# Patient Record
Sex: Female | Born: 1945 | Race: White | Hispanic: No | Marital: Married | Smoking: Never smoker
Health system: Southern US, Community
[De-identification: ages and names within clinical notes are randomized; demographics above are authoritative.]

## PROBLEM LIST (undated history)

## (undated) DIAGNOSIS — F32A Depression, unspecified: Secondary | ICD-10-CM

## (undated) DIAGNOSIS — T7840XA Allergy, unspecified, initial encounter: Secondary | ICD-10-CM

## (undated) DIAGNOSIS — Z8739 Personal history of other diseases of the musculoskeletal system and connective tissue: Secondary | ICD-10-CM

## (undated) DIAGNOSIS — F419 Anxiety disorder, unspecified: Secondary | ICD-10-CM

## (undated) DIAGNOSIS — H269 Unspecified cataract: Secondary | ICD-10-CM

## (undated) DIAGNOSIS — Z8719 Personal history of other diseases of the digestive system: Secondary | ICD-10-CM

## (undated) DIAGNOSIS — R195 Other fecal abnormalities: Secondary | ICD-10-CM

## (undated) DIAGNOSIS — F329 Major depressive disorder, single episode, unspecified: Secondary | ICD-10-CM

## (undated) HISTORY — DX: Other fecal abnormalities: R19.5

## (undated) HISTORY — DX: Unspecified cataract: H26.9

## (undated) HISTORY — PX: TUBAL LIGATION: SHX77

## (undated) HISTORY — PX: HAND SURGERY: SHX662

## (undated) HISTORY — PX: CHOLECYSTECTOMY: SHX55

## (undated) HISTORY — DX: Anxiety disorder, unspecified: F41.9

## (undated) HISTORY — PX: ABDOMINAL HYSTERECTOMY: SHX81

## (undated) HISTORY — DX: Depression, unspecified: F32.A

## (undated) HISTORY — DX: Major depressive disorder, single episode, unspecified: F32.9

## (undated) HISTORY — PX: COLONOSCOPY: SHX174

## (undated) HISTORY — PX: APPENDECTOMY: SHX54

## (undated) HISTORY — DX: Personal history of other diseases of the digestive system: Z87.19

## (undated) HISTORY — DX: Allergy, unspecified, initial encounter: T78.40XA

## (undated) HISTORY — DX: Personal history of other diseases of the musculoskeletal system and connective tissue: Z87.39

---

## 1998-09-20 ENCOUNTER — Other Ambulatory Visit: Admission: RE | Admit: 1998-09-20 | Discharge: 1998-09-20 | Payer: Self-pay | Admitting: Obstetrics and Gynecology

## 1999-10-14 HISTORY — PX: BREAST BIOPSY: SHX20

## 1999-12-30 ENCOUNTER — Other Ambulatory Visit: Admission: RE | Admit: 1999-12-30 | Discharge: 1999-12-30 | Payer: Self-pay | Admitting: Obstetrics and Gynecology

## 2000-02-20 ENCOUNTER — Encounter: Admission: RE | Admit: 2000-02-20 | Discharge: 2000-02-20 | Payer: Self-pay | Admitting: Obstetrics and Gynecology

## 2000-02-20 ENCOUNTER — Encounter: Payer: Self-pay | Admitting: Obstetrics and Gynecology

## 2000-02-26 ENCOUNTER — Encounter: Admission: RE | Admit: 2000-02-26 | Discharge: 2000-02-26 | Payer: Self-pay | Admitting: Obstetrics and Gynecology

## 2000-02-26 ENCOUNTER — Encounter: Payer: Self-pay | Admitting: Obstetrics and Gynecology

## 2000-03-02 ENCOUNTER — Encounter: Admission: RE | Admit: 2000-03-02 | Discharge: 2000-03-02 | Payer: Self-pay | Admitting: Obstetrics and Gynecology

## 2000-03-02 ENCOUNTER — Other Ambulatory Visit: Admission: RE | Admit: 2000-03-02 | Discharge: 2000-03-02 | Payer: Self-pay | Admitting: Obstetrics and Gynecology

## 2000-03-02 ENCOUNTER — Encounter: Payer: Self-pay | Admitting: Obstetrics and Gynecology

## 2000-04-09 ENCOUNTER — Encounter: Payer: Self-pay | Admitting: General Surgery

## 2000-04-09 ENCOUNTER — Observation Stay (HOSPITAL_COMMUNITY): Admission: RE | Admit: 2000-04-09 | Discharge: 2000-04-10 | Payer: Self-pay | Admitting: General Surgery

## 2000-04-09 ENCOUNTER — Encounter (INDEPENDENT_AMBULATORY_CARE_PROVIDER_SITE_OTHER): Payer: Self-pay | Admitting: Specialist

## 2000-06-23 ENCOUNTER — Encounter (INDEPENDENT_AMBULATORY_CARE_PROVIDER_SITE_OTHER): Payer: Self-pay

## 2000-06-23 ENCOUNTER — Inpatient Hospital Stay (HOSPITAL_COMMUNITY): Admission: RE | Admit: 2000-06-23 | Discharge: 2000-06-25 | Payer: Self-pay | Admitting: Urology

## 2002-08-11 ENCOUNTER — Encounter: Admission: RE | Admit: 2002-08-11 | Discharge: 2002-08-11 | Payer: Self-pay | Admitting: Obstetrics and Gynecology

## 2002-08-11 ENCOUNTER — Encounter: Payer: Self-pay | Admitting: Obstetrics and Gynecology

## 2003-09-14 ENCOUNTER — Encounter: Payer: Self-pay | Admitting: Internal Medicine

## 2003-09-21 ENCOUNTER — Encounter: Admission: RE | Admit: 2003-09-21 | Discharge: 2003-09-21 | Payer: Self-pay | Admitting: Obstetrics and Gynecology

## 2004-09-03 ENCOUNTER — Ambulatory Visit: Payer: Self-pay | Admitting: Internal Medicine

## 2004-09-17 ENCOUNTER — Ambulatory Visit: Payer: Self-pay | Admitting: Internal Medicine

## 2005-01-08 ENCOUNTER — Encounter: Admission: RE | Admit: 2005-01-08 | Discharge: 2005-01-08 | Payer: Self-pay | Admitting: Obstetrics and Gynecology

## 2005-06-30 ENCOUNTER — Emergency Department (HOSPITAL_COMMUNITY): Admission: EM | Admit: 2005-06-30 | Discharge: 2005-06-30 | Payer: Self-pay | Admitting: Emergency Medicine

## 2005-09-15 ENCOUNTER — Ambulatory Visit: Payer: Self-pay | Admitting: Internal Medicine

## 2005-10-07 ENCOUNTER — Ambulatory Visit: Payer: Self-pay | Admitting: Internal Medicine

## 2005-10-09 ENCOUNTER — Ambulatory Visit: Payer: Self-pay | Admitting: Internal Medicine

## 2005-10-09 ENCOUNTER — Encounter (INDEPENDENT_AMBULATORY_CARE_PROVIDER_SITE_OTHER): Payer: Self-pay | Admitting: Specialist

## 2005-11-14 ENCOUNTER — Ambulatory Visit: Payer: Self-pay | Admitting: Internal Medicine

## 2006-01-09 ENCOUNTER — Encounter: Admission: RE | Admit: 2006-01-09 | Discharge: 2006-01-09 | Payer: Self-pay | Admitting: Obstetrics and Gynecology

## 2007-02-08 ENCOUNTER — Encounter: Admission: RE | Admit: 2007-02-08 | Discharge: 2007-02-08 | Payer: Self-pay | Admitting: Family Medicine

## 2008-05-05 ENCOUNTER — Ambulatory Visit: Payer: Self-pay | Admitting: Internal Medicine

## 2008-05-05 DIAGNOSIS — F329 Major depressive disorder, single episode, unspecified: Secondary | ICD-10-CM

## 2008-05-05 DIAGNOSIS — F32A Depression, unspecified: Secondary | ICD-10-CM | POA: Insufficient documentation

## 2008-05-05 DIAGNOSIS — E785 Hyperlipidemia, unspecified: Secondary | ICD-10-CM | POA: Insufficient documentation

## 2008-05-05 DIAGNOSIS — K219 Gastro-esophageal reflux disease without esophagitis: Secondary | ICD-10-CM | POA: Insufficient documentation

## 2008-05-05 DIAGNOSIS — E7800 Pure hypercholesterolemia, unspecified: Secondary | ICD-10-CM | POA: Insufficient documentation

## 2008-05-05 LAB — CONVERTED CEMR LAB
Cholesterol, target level: 200 mg/dL
HDL goal, serum: 50 mg/dL
LDL Goal: 80 mg/dL

## 2008-05-11 ENCOUNTER — Ambulatory Visit: Payer: Self-pay | Admitting: Internal Medicine

## 2008-05-15 ENCOUNTER — Encounter (INDEPENDENT_AMBULATORY_CARE_PROVIDER_SITE_OTHER): Payer: Self-pay | Admitting: *Deleted

## 2008-05-15 LAB — CONVERTED CEMR LAB
Albumin: 4.4 g/dL (ref 3.5–5.2)
Basophils Relative: 0.4 % (ref 0.0–3.0)
Direct LDL: 193 mg/dL
Eosinophils Absolute: 0.1 10*3/uL (ref 0.0–0.7)
Eosinophils Relative: 1.5 % (ref 0.0–5.0)
HCT: 36.8 % (ref 36.0–46.0)
Hgb A1c MFr Bld: 5.7 % (ref 4.6–6.0)
MCV: 87.6 fL (ref 78.0–100.0)
Monocytes Absolute: 0.6 10*3/uL (ref 0.1–1.0)
Neutro Abs: 4.5 10*3/uL (ref 1.4–7.7)
RBC: 4.2 M/uL (ref 3.87–5.11)
TSH: 2.55 microintl units/mL (ref 0.35–5.50)
Total Protein: 7.4 g/dL (ref 6.0–8.3)
WBC: 6.7 10*3/uL (ref 4.5–10.5)

## 2008-05-29 ENCOUNTER — Ambulatory Visit: Payer: Self-pay | Admitting: Internal Medicine

## 2008-05-30 ENCOUNTER — Encounter (INDEPENDENT_AMBULATORY_CARE_PROVIDER_SITE_OTHER): Payer: Self-pay | Admitting: *Deleted

## 2008-07-06 ENCOUNTER — Ambulatory Visit: Payer: Self-pay | Admitting: Internal Medicine

## 2008-07-06 LAB — CONVERTED CEMR LAB: Cholesterol, target level: 200 mg/dL

## 2008-07-14 ENCOUNTER — Telehealth (INDEPENDENT_AMBULATORY_CARE_PROVIDER_SITE_OTHER): Payer: Self-pay | Admitting: *Deleted

## 2008-07-14 ENCOUNTER — Ambulatory Visit: Payer: Self-pay | Admitting: Internal Medicine

## 2008-07-14 LAB — CONVERTED CEMR LAB
Inflenza A Ag: NEGATIVE
Influenza B Ag: NEGATIVE

## 2008-07-20 ENCOUNTER — Telehealth (INDEPENDENT_AMBULATORY_CARE_PROVIDER_SITE_OTHER): Payer: Self-pay | Admitting: *Deleted

## 2008-09-29 ENCOUNTER — Ambulatory Visit: Payer: Self-pay | Admitting: Internal Medicine

## 2008-09-30 LAB — CONVERTED CEMR LAB
ALT: 19 units/L (ref 0–35)
Bilirubin, Direct: 0.1 mg/dL (ref 0.0–0.3)
HDL: 40.3 mg/dL (ref >39.0)
Total Protein: 7.3 g/dL (ref 6.0–8.3)
Triglycerides: 210 mg/dL (ref 0–149)

## 2008-10-02 ENCOUNTER — Encounter (INDEPENDENT_AMBULATORY_CARE_PROVIDER_SITE_OTHER): Payer: Self-pay | Admitting: *Deleted

## 2008-10-09 ENCOUNTER — Ambulatory Visit: Payer: Self-pay | Admitting: Internal Medicine

## 2008-12-26 ENCOUNTER — Telehealth (INDEPENDENT_AMBULATORY_CARE_PROVIDER_SITE_OTHER): Payer: Self-pay | Admitting: *Deleted

## 2009-07-13 ENCOUNTER — Ambulatory Visit: Payer: Self-pay | Admitting: Internal Medicine

## 2009-07-15 LAB — CONVERTED CEMR LAB
Direct LDL: 189 mg/dL
HDL: 38.1 mg/dL — ABNORMAL LOW (ref >39.00)
Total CHOL/HDL Ratio: 7
Triglycerides: 214 mg/dL — ABNORMAL HIGH (ref 0.0–149.0)

## 2009-07-16 ENCOUNTER — Telehealth: Payer: Self-pay | Admitting: Internal Medicine

## 2009-09-24 ENCOUNTER — Encounter (INDEPENDENT_AMBULATORY_CARE_PROVIDER_SITE_OTHER): Payer: Self-pay | Admitting: *Deleted

## 2009-11-22 ENCOUNTER — Telehealth (INDEPENDENT_AMBULATORY_CARE_PROVIDER_SITE_OTHER): Payer: Self-pay | Admitting: *Deleted

## 2009-11-23 ENCOUNTER — Ambulatory Visit: Payer: Self-pay | Admitting: Internal Medicine

## 2009-11-23 ENCOUNTER — Telehealth (INDEPENDENT_AMBULATORY_CARE_PROVIDER_SITE_OTHER): Payer: Self-pay | Admitting: *Deleted

## 2010-01-25 ENCOUNTER — Telehealth (INDEPENDENT_AMBULATORY_CARE_PROVIDER_SITE_OTHER): Payer: Self-pay | Admitting: *Deleted

## 2010-02-01 ENCOUNTER — Encounter: Admission: RE | Admit: 2010-02-01 | Discharge: 2010-02-01 | Payer: Self-pay | Admitting: Obstetrics and Gynecology

## 2010-02-11 ENCOUNTER — Encounter (INDEPENDENT_AMBULATORY_CARE_PROVIDER_SITE_OTHER): Payer: Self-pay | Admitting: *Deleted

## 2010-03-08 ENCOUNTER — Ambulatory Visit: Payer: Self-pay | Admitting: Internal Medicine

## 2010-03-08 DIAGNOSIS — F419 Anxiety disorder, unspecified: Secondary | ICD-10-CM | POA: Insufficient documentation

## 2010-03-08 DIAGNOSIS — K589 Irritable bowel syndrome without diarrhea: Secondary | ICD-10-CM | POA: Insufficient documentation

## 2010-07-19 ENCOUNTER — Encounter: Payer: Self-pay | Admitting: Family Medicine

## 2010-07-19 ENCOUNTER — Ambulatory Visit: Payer: Self-pay | Admitting: Family Medicine

## 2010-07-19 DIAGNOSIS — R0602 Shortness of breath: Secondary | ICD-10-CM | POA: Insufficient documentation

## 2010-07-19 DIAGNOSIS — R0609 Other forms of dyspnea: Secondary | ICD-10-CM | POA: Insufficient documentation

## 2010-07-19 DIAGNOSIS — R609 Edema, unspecified: Secondary | ICD-10-CM | POA: Insufficient documentation

## 2010-07-23 ENCOUNTER — Ambulatory Visit: Payer: Self-pay | Admitting: Internal Medicine

## 2010-07-23 ENCOUNTER — Encounter: Admission: RE | Admit: 2010-07-23 | Discharge: 2010-07-23 | Payer: Self-pay | Admitting: Internal Medicine

## 2010-07-23 ENCOUNTER — Telehealth (INDEPENDENT_AMBULATORY_CARE_PROVIDER_SITE_OTHER): Payer: Self-pay | Admitting: *Deleted

## 2010-07-23 DIAGNOSIS — S93409A Sprain of unspecified ligament of unspecified ankle, initial encounter: Secondary | ICD-10-CM | POA: Insufficient documentation

## 2010-07-23 LAB — CONVERTED CEMR LAB
Albumin: 4.6 g/dL (ref 3.5–5.2)
Basophils Absolute: 0 10*3/uL (ref 0.0–0.1)
CO2: 23 meq/L (ref 19–32)
Chloride: 103 meq/L (ref 96–112)
HDL: 40 mg/dL (ref >39)
Hemoglobin: 13.2 g/dL (ref 12.0–15.0)
LDL Cholesterol: 171 mg/dL — ABNORMAL HIGH (ref 0–99)
Lymphocytes Relative: 23 % (ref 12–46)
Neutro Abs: 5.9 10*3/uL (ref 1.7–7.7)
Neutrophils Relative %: 69 % (ref 43–77)
Platelets: 288 10*3/uL (ref 150–400)
Potassium: 4 meq/L (ref 3.5–5.3)
RDW: 13 % (ref 11.5–15.5)
Sodium: 138 meq/L (ref 135–145)
TSH: 1.719 microintl units/mL (ref 0.350–4.500)
Total Bilirubin: 0.4 mg/dL (ref 0.3–1.2)
Total CHOL/HDL Ratio: 6.6
Total Protein: 7.3 g/dL (ref 6.0–8.3)

## 2010-08-02 ENCOUNTER — Ambulatory Visit: Payer: Self-pay | Admitting: Internal Medicine

## 2010-08-06 ENCOUNTER — Encounter: Payer: Self-pay | Admitting: Internal Medicine

## 2010-08-19 ENCOUNTER — Encounter: Payer: Self-pay | Admitting: Internal Medicine

## 2010-08-20 ENCOUNTER — Encounter: Payer: Self-pay | Admitting: Internal Medicine

## 2010-10-25 ENCOUNTER — Encounter
Admission: RE | Admit: 2010-10-25 | Discharge: 2010-10-25 | Payer: Self-pay | Source: Home / Self Care | Attending: Internal Medicine | Admitting: Internal Medicine

## 2010-10-25 ENCOUNTER — Encounter: Payer: Self-pay | Admitting: Internal Medicine

## 2010-11-02 ENCOUNTER — Encounter: Payer: Self-pay | Admitting: Internal Medicine

## 2010-11-10 LAB — CONVERTED CEMR LAB: Cholesterol, target level: 200 mg/dL

## 2010-11-11 ENCOUNTER — Telehealth (INDEPENDENT_AMBULATORY_CARE_PROVIDER_SITE_OTHER): Payer: Self-pay | Admitting: *Deleted

## 2010-11-14 NOTE — Assessment & Plan Note (Signed)
Summary: rto /cbs   Vital Signs:  Patient profile:   65 year old female Weight:      180.6 pounds BMI:     33.69 Pulse rate:   72 / minute Resp:     17 per minute BP sitting:   122 / 78  (left arm) Cuff size:   large  Vitals Entered By: Shonna Chock CMA (August 02, 2010 10:32 AM) CC: 1.) Follow-up visit: left ankle swelling. Since last OV patient with arm/shoulder/neck pain-related to fall   2.) Discuss labs and recent radiology reports (copies given)   Primary Care Provider:  Alwyn Ren  CC:  1.) Follow-up visit: left ankle swelling. Since last OV patient with arm/shoulder/neck pain-related to fall   2.) Discuss labs and recent radiology reports (copies given).  History of Present Illness: Ankle pain improved "80%", worse after being on feet all day. She takes Tylenol 2 at bedtime  & using ACE wrap or brace.Films reviewed : soft tissue swelling only. LLE larger than RLE, but Doppler negative for DVT. Labs reviewed ; Metabolic Syndrome ("Pre Diabetes") pattern. Risks & options and role of HFCS sugar  discussed   Current Medications (verified): 1)  Citalopram Hydrobromide 40 Mg  Tabs .Marland Kitchen.. 1 By Mouth Once Daily 2)  Zyrtec Hives Relief 10 Mg Tabs (Cetirizine Hcl) .Marland Kitchen.. 1 By Mouth Once Daily As Needed 3)  Hyoscyamine Sulfate 0.125 Mg Subl (Hyoscyamine Sulfate) .Marland Kitchen.. 1 Under Tongue As Needed For Gi Symptoms 4)  Ranitidine Hcl 150 Mg Tabs (Ranitidine Hcl) .Marland Kitchen.. 1 Every 12 Hrs Pre Meals As Needed 5)  Furosemide 20 Mg Tabs (Furosemide) .Marland Kitchen.. 1 By Mouth Qam  Allergies: 1)  ! Pcn 2)  ! Codeine 3)  ! Cipro  Physical Exam  General:  in no acute distress; alert,appropriate and cooperative throughout examination Pulses:  R and L dorsalis pedis and posterior tibial pulses are full and equal bilaterally Extremities:  trace left pedal edema.   Pain @ L medial malleolus with ankle inversion& @ LMM & across  anterior ankle with eversion. Neg Homan's LLE Skin:  Intact without suspicious lesions or  rashes   Impression & Recommendations:  Problem # 1:  ANKLE SPRAIN (ICD-845.00)  Orders: Orthopedic Referral (Ortho)  Problem # 2:  HYPERLIPIDEMIA (ICD-272.4) Metabolic Syndrome pattern  Complete Medication List: 1)  Citalopram Hydrobromide 40 Mg Tabs  .Marland KitchenMarland Kitchen. 1 by mouth once daily 2)  Zyrtec Hives Relief 10 Mg Tabs (Cetirizine hcl) .Marland Kitchen.. 1 by mouth once daily as needed 3)  Hyoscyamine Sulfate 0.125 Mg Subl (Hyoscyamine sulfate) .Marland Kitchen.. 1 under tongue as needed for gi symptoms 4)  Ranitidine Hcl 150 Mg Tabs (Ranitidine hcl) .Marland Kitchen.. 1 every 12 hrs pre meals as needed 5)  Furosemide 20 Mg Tabs (Furosemide) .Marland Kitchen.. 1 by mouth qam  Patient Instructions: 1)  Continue wearing soft brace when up. Glucosamine sulfate 1500 mg once daily until seen by Ortho.Consume < 30 grams of HFCS sugar/ day. 2)  Please schedule a follow-up fasting Lab  appointment in 4 months. 3)  Lipid Panel prior to visit, ICD-9:277.7  4)  HbgA1C prior to visit, ICD-9:277.7   Orders Added: 1)  Est. Patient Level III [02725] 2)  Orthopedic Referral [Ortho]

## 2010-11-14 NOTE — Progress Notes (Signed)
Summary: Medication Concerns  Phone Note Call from Patient Call back at Home Phone [phone] Call back at [phone]   Caller: Patient Summary of Call: Message left on VM: Patient would like to start chlosterol med that was recommended at last lab check. Patient is unable to locate Rx and would like it sent to [facility] [phone].    Chrae Malloy  January 25, 2010 1:45 PM   Follow-up for Phone Call        I called patient and informed her last OV 09/2008. She will need an appointment first.  Patient was transferred to the scheduler to set up an appointment Follow-up by: Shonna Chock,  January 25, 2010 1:56 PM

## 2010-11-14 NOTE — Miscellaneous (Signed)
Summary: Orders Update   Clinical Lists Changes  Orders: Added new Referral order of Radiology Referral (Radiology) - Signed 

## 2010-11-14 NOTE — Assessment & Plan Note (Signed)
Summary: cpx, discuss lab/cbs   Vital Signs:  Patient profile:   65 year old female Height:      61.5 inches Weight:      178.2 pounds BMI:     33.25 Temp:     98.7 degrees F oral Pulse rate:   72 / minute Resp:     14 per minute BP sitting:   110 / 74  (left arm) Cuff size:   large  Vitals Entered By: Shonna Chock (Mar 08, 2010 11:00 AM)  CC: Lipid Management Comments REVIEWED MED LIST, PATIENT AGREED DOSE AND INSTRUCTION CORRECT    CC:  Lipid Management.  History of Present Illness: Pam Butler is here for a physical; she has been having GERD symptoms for several months intermittently off Ranitidine .  Lipid Management History:      Positive NCEP/ATP III risk factors include female age 93 years old or older, HDL cholesterol less than 40, and family history for ischemic heart disease (females less than 23 years old).  Negative NCEP/ATP III risk factors include non-diabetic, non-tobacco-user status, non-hypertensive, no ASHD (atherosclerotic heart disease), no prior stroke/TIA, no peripheral vascular disease, and no history of aortic aneurysm.     Allergies: 1)  ! Pcn 2)  ! Codeine 3)  ! Cipro  Past History:  Past Medical History: +ANA, - RA factor 1997 Hyperlipidemia: NMR 2005: LDL 78(1301/930), HDL 61, TG 103. [place] LDL = < 130. Chronic urticaria,1997, Dr Venancio Poisson GERD  Past Surgical History: Cholecystectomy 2001 Colonoscopy negative  2001 & 2006 , done for diarrhea & rectal bleeding Hysterectomy & BSO; baldder tacking for prolapse /pain 2001 Appendectomy with Tubal ligation; G 2 P 2 Tonsillectomy  Family History: Father: alcoholism, died of PNA Mother: lipids, anxiety/depression,HTN Siblings: sister: lipids M uncle: depression, polio ; PGM: died  71 ? MI  Social History: Irregular exercise-yes:treadmill, Pilates Never Smoked Alcohol use-no Occupation: Lab / Sales executive Married  Review of Systems       The patient complains of peripheral  edema.  The patient denies fever, vision loss, decreased hearing, hoarseness, chest pain, syncope, prolonged cough, headaches, hemoptysis, hematuria, incontinence, suspicious skin lesions, abnormal bleeding, enlarged lymph nodes, and angioedema.         Weight up 10# over past 6 months. DOE after 20 min of CVE. Some edema by end of day. GI:  See HPI; Complains of change in bowel habits, diarrhea, indigestion, and nausea; denies abdominal pain, bloody stools, constipation, excessive appetite, and vomiting; Intermittent diarrhea , especially after salads. S/P GI evaluation . Bleeding with constipation only. Marland Kitchen Psych:  Complains of anxiety, depression, easily angered, easily tearful, and irritability; Symptoms despite Citalopram 20 mg once daily .Marland Kitchen  Physical Exam  General:  well-nourished,in no acute distress; alert,appropriate and cooperative throughout examination Head:  Normocephalic and atraumatic without obvious abnormalities.  Eyes:  No corneal or conjunctival inflammation noted. EOMI. Perrla. Funduscopic exam benign, without hemorrhages, exudates or papilledema. Vision grossly normal. Ears:  External ear exam shows no significant lesions or deformities.  Otoscopic examination reveals clear canals, tympanic membranes are intact bilaterally without bulging, retraction, inflammation or discharge. Hearing is grossly normal bilaterally. Nose:  External nasal examination shows no deformity or inflammation. Nasal mucosa are pink and moist without lesions or exudates. Mouth:  Oral mucosa and oropharynx without lesions or exudates.  Teeth in good repair. Neck:  No deformities, masses, or tenderness noted. Lungs:  Normal respiratory effort, chest expands symmetrically. Lungs are clear to auscultation, no crackles or  wheezes. Heart:  normal rate, regular rhythm, no gallop, no rub, no JVD, no HJR, and grade 1/2 /6 systolic murmur.   Abdomen:  Bowel sounds positive,abdomen soft and non-tender without masses,  organomegaly or hernias noted. Genitalia:  Dr Genice Rouge Msk:  No deformity or scoliosis noted of thoracic or lumbar spine.   Pulses:  R and L carotid,radial,dorsalis pedis and posterior tibial pulses are full and equal bilaterally Extremities:  No clubbing, cyanosis  or deformity noted with normal full range of motion of all joints.  Trace left pedal edema and trace right pedal edema.   Neurologic:  alert & oriented X3 and DTRs symmetrical and normal.   Skin:  Biopsy sites upper back with eschar formation Cervical Nodes:  No lymphadenopathy noted Axillary Nodes:  No palpable lymphadenopathy Psych:  memory intact for recent and remote, normally interactive, good eye contact, not anxious appearing, and not depressed appearing.     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 1 by mouth once daily Her updated medication list for this problem includes:    Hyoscyamine Sulfate 0.125 Mg Subl (Hyoscyamine sulfate) .Marland Kitchen... 1 under tongue as needed for gi symptoms    Ranitidine Hcl 150 Mg Tabs (Ranitidine hcl) .Marland Kitchen... 1 every 12 hrs pre meals as needed  Orders: Venipuncture (14782)  Problem # 4:  HYPERLIPIDEMIA (ICD-272.4)  The following medications were removed from the medication list:    Pravastatin Sodium 40 Mg Tabs (Pravastatin sodium) .Marland Kitchen... 1 at bedtime  Orders: EKG w/ Interpretation (93000) Venipuncture  (95621) TLB-Lipid Panel (80061-LIPID)  Problem # 5:  ANXIETY STATE, UNSPECIFIED (ICD-300.00)  Complete Medication List: 1)  Citalopram Hydrobromide 40 Mg Tabs  .Marland KitchenMarland Kitchen. 1 by mouth once daily 2)  Zyrtec Hives Relief 10 Mg Tabs (Cetirizine hcl) .Marland Kitchen.. 1 by mouth once daily as needed 3)  Hyoscyamine Sulfate 0.125 Mg Subl (Hyoscyamine sulfate) .Marland Kitchen.. 1 under tongue as needed for gi symptoms 4)  Ranitidine Hcl 150 Mg Tabs (Ranitidine hcl) .Marland Kitchen.. 1 every 12 hrs pre meals as needed  Lipid Assessment/Plan:      Based on NCEP/ATP III, the patient's risk factor category is "2 or more risk factors and a calculated 10 year CAD risk of > 20%".  The patient's lipid goals are as follows: Total cholesterol goal is 200; LDL cholesterol goal is 100; HDL cholesterol goal is 50; Triglyceride goal is 150.  Her LDL cholesterol goal has not been met.  Secondary causes for hyperlipidemia have been ruled out.  She has been counseled on adjunctive measures for lowering her cholesterol and has been provided with dietary instructions.    Patient Instructions: 1)  Align daily as needed for loose or diarrheal stools.Schedule fasting  labs; see Diagnoses for Codes: 2)  BMP; 3)  Hepatic Panel ; 4)  Lipid Panel ; 5)  TSH ; 6)  CBC w/ Diff . 7)  Avoid foods high in acid (tomatoes, citrus juices, spicy foods). Avoid eating within two hours of lying down or before exercising. Do not over eat; try smaller more frequent meals.  Elevate head of bed twelve inches when sleeping. Prescriptions: RANITIDINE HCL 150 MG TABS (RANITIDINE HCL) 1 every 12 hrs pre meals as needed  #180 x 1   Entered and Authorized by:   Marga Melnick MD   Signed by:   Marga Melnick MD on 03/08/2010   Method used:   Print then Give to Patient   RxID:   [id number] HYOSCYAMINE SULFATE 0.125 MG SUBL (HYOSCYAMINE SULFATE) 1 under tongue as needed for GI symptoms  #30 x 1   Entered and Authorized by:   Marga Melnick MD   Signed by:   Marga Melnick MD on  03/08/2010   Method used:   Print then Give to Patient   RxID:   [id number] CITALOPRAM HYDROBROMIDE 40 MG  TABS 1 by mouth once daily  #90 x 1   Entered and Authorized by:   Marga Melnick MD   Signed by:   Marga Melnick MD on 03/08/2010   Method used:   Print then Give to Patient   RxID:   [id number]    Immunization History:  Tetanus/Td Immunization History:    Tetanus/Td:  historical (06/30/2005)

## 2010-11-14 NOTE — Assessment & Plan Note (Signed)
Summary: FELL THIS MORNING HURT ANKLE/ADD PER FELECIA/KN   Vital Signs:  Patient profile:   65 year old female Height:      61.5 inches Weight:      182 pounds O2 Sat:      96 % on Room air Temp:     98.0 degrees F oral Pulse rate:   79 / minute BP sitting:   106 / 78  (left arm)  Vitals Entered By: Jeremy Johann CMA (July 23, 2010 12:45 PM)  O2 Flow:  Room air CC: fell down step this AM twisted left ankle and hit right hip   History of Present Illness: patient is seen today acutely. This morning she was going down the stairs, missed a step,  twisted her left ankle , then  she tried  to compensate and fell  on her right shoulder and hip. Her chief complaint today is ankle swelling, very mild soreness in the shoulder and hip.  While she was here, I noted a bigger left calf. On further questioning, she was seen a few weeks ago with bilateral edema, started on Lasix. The patient recalls the left leg to be slightly larger than the right even at that time but is unclear for how long  thathas been the case.  ROS Denies chest pain or palpitations For the last 2 months, she is having more difficulty breathing with activities like playing with her grandkids or going up the stairs. She just got  a echocardiogram done for DOE,  results pending  denies any recent airplane trips.     Current Medications (verified): 1)  Citalopram Hydrobromide 40 Mg  Tabs .Marland Kitchen.. 1 By Mouth Once Daily 2)  Zyrtec Hives Relief 10 Mg Tabs (Cetirizine Hcl) .Marland Kitchen.. 1 By Mouth Once Daily As Needed 3)  Hyoscyamine Sulfate 0.125 Mg Subl (Hyoscyamine Sulfate) .Marland Kitchen.. 1 Under Tongue As Needed For Gi Symptoms 4)  Ranitidine Hcl 150 Mg Tabs (Ranitidine Hcl) .Marland Kitchen.. 1 Every 12 Hrs Pre Meals As Needed 5)  Furosemide 20 Mg Tabs (Furosemide) .Marland Kitchen.. 1 By Mouth Qam  Allergies (verified): 1)  ! Pcn 2)  ! Codeine 3)  ! Cipro  Past History:  Past Medical History: Reviewed history from 03/08/2010 and no changes required. +ANA,  - RA factor 1997 Hyperlipidemia: NMR 2005: LDL 78(1301/930), HDL 61, TG 103. [place]ham LDL = < 130. Chronic urticaria,1997, Dr Venancio Poisson GERD  Past Surgical History: Reviewed history from 03/08/2010 and no changes required. Cholecystectomy 2001 Colonoscopy negative  2001 & 2006 , done for diarrhea & rectal bleeding Hysterectomy & BSO; baldder tacking for prolapse /pain 2001 Appendectomy with Tubal ligation; G 2 P 2 Tonsillectomy  Social History: Reviewed history from 03/08/2010 and no changes required. Irregular exercise-yes:treadmill, Pilates Never Smoked Alcohol use-no Occupation: Lab / Sales executive Married  Physical Exam  General:  alert and well-developed.  alert and well-developed.   Msk:  right ankle normal Left ankle without deformities, slightly swollen, no reds, no warmth. Passive range of motion cause very mild discomfort  Pulses:  good bilateral pedal pulses Extremities:  no pitting edema Measurement of the calves showing  that the left one is 1 inch bigger than the right. calves are not tender, red or warm   Impression & Recommendations:  Problem # 1:  ANKLE SPRAIN (ICD-845.00) we'll do an x-ray, ice, elevate the leg Orders: T-Ankle Comp Left Min 3 Views (73610TC)  Problem # 2:  EDEMA (ICD-782.3) I am concerned about the left calf being bigger than  the right. The patient also has a two-month history of a mild dyspnea on exertion. Plan: Get ultrasound to rule out DVT, if negative she needs to followup with PCP in 2 weeks and review the recent echocardiogram. If the ultrasound is positive, I will recommend admission overnight to get a CT chest and start anticoagulation. Her updated medication list for this problem includes:    Furosemide 20 Mg Tabs (Furosemide) .Marland Kitchen... 1 by mouth qam  Orders: T-Ankle Comp Left Min 3 Views [phone]) Radiology Referral (Radiology)  Complete Medication List: 1)  Citalopram Hydrobromide 40 Mg Tabs  .Marland KitchenMarland Kitchen. 1 by mouth once  daily 2)  Zyrtec Hives Relief 10 Mg Tabs (Cetirizine hcl) .Marland Kitchen.. 1 by mouth once daily as needed 3)  Hyoscyamine Sulfate 0.125 Mg Subl (Hyoscyamine sulfate) .Marland Kitchen.. 1 under tongue as needed for gi symptoms 4)  Ranitidine Hcl 150 Mg Tabs (Ranitidine hcl) .Marland Kitchen.. 1 every 12 hrs pre meals as needed 5)  Furosemide 20 Mg Tabs (Furosemide) .Marland Kitchen.. 1 by mouth qam  Patient Instructions: 1)  elevate the left leg 2)  Eyes to the left ankle 3)  ibuprofen as needed 4)  Ace Wrap the ankle

## 2010-11-14 NOTE — Progress Notes (Signed)
Summary: Chest Xray results  Phone Note Call from Patient Call back at Home Phone [phone]   Caller: Patient Summary of Call: PATIENT IS REQUESTING RESULTS FROM HER CHEST X-RAY. PATIENT WANTS TO KNOW IF SHOULD BE PUT ON ANY MEDICIATION. Initial call taken by: Barb Merino,  November 23, 2009 4:22 PM  Follow-up for Phone Call        Spoke with patient, (per Dr.Hopper no pneumonia. If patient no better she needs to be re-evaluated.) Patient was offered Sat clinic and said she may not be able to do that. Scheduled appointment for Monday @ 1:30pm with Dr.Hopper. Patient was advised on how to get on Saturday clinic scheduled if needed Follow-up by: Shonna Chock,  November 23, 2009 4:47 PM

## 2010-11-14 NOTE — Letter (Signed)
Summary: Colonoscopy Date Change Letter  [facility]  [address] [place], [place] [address]   Phone: [phone]  Fax: [phone]      Feb 11, 2010 MRN: [id number]   AVAMAE DEHAAN [address] [place], [place]  [address]   Dear Ms. RADEMAKER,   Previously you were recommended to have a repeat colonoscopy around this time. Your chart was recently reviewed by Dr. Yancey Flemings of [facility]. Follow up colonoscopy is now recommended in January 2017. This revised recommendation is based on current, nationally recognized guidelines for colorectal cancer screening and polyp surveillance. These guidelines are endorsed by the American Cancer Society, [employer] on Colorectal Cancer as well as numerous other major medical organizations.  Please understand that our recommendation assumes that you do not have any new symptoms such as bleeding, a change in bowel habits, anemia, or significant abdominal discomfort. If you do have any concerning GI symptoms or want to discuss the guideline recommendations, please call to arrange an office visit at your earliest convenience. Otherwise we will keep you in our reminder system and contact you 1-2 months prior to the date listed above to schedule your next colonoscopy.  Thank you,   Yancey Flemings, M.D.  [facility] [phone]

## 2010-11-14 NOTE — Consult Note (Signed)
Summary: [facility]  [facility]   Imported By: Lanelle Bal 08/27/2010 09:14:31  _____________________________________________________________________  External Attachment:    Type:   Image     Comment:   External Document

## 2010-11-14 NOTE — Progress Notes (Signed)
Summary: FELL THIS MORNING,,WANTS XRAYS  Phone Note Call from Patient Call back at CELL--[phone]   Caller: Patient Summary of Call: PATIENT SAW DR HOPPER LAST WEEK FOR SWELLING IN LEGS  SHE FELL THIS MORNING GETTING READY FOR WORK ---HURT LEFT LEG AND ANKLE--WENT TO WORK ANYWAY  NOW SAYS LEFT ANKLE IS VERY SWOLLEN --SHE IS AT WORK WAITNG FOR CALL ON HER CELL--SHE WANTS XRAYS Initial call taken by: Jerolyn Shin,  July 23, 2010 12:02 PM  Follow-up for Phone Call        pt walked in to office seeing dr Drue Novel.................Marland KitchenFelecia Deloach CMA  July 23, 2010 12:47 PM

## 2010-11-14 NOTE — Progress Notes (Signed)
Summary: [facility] 2-10-PT REQUESTS CHEST XRAY  Phone Note Call from Patient Call back at Home Phone [phone]   Caller: Patient Call For: Marga Melnick MD Reason for Call: Talk to Nurse Summary of Call: PATIENT CALLING, STATES SINCE SHE WENT TO URGENT CARE 1 WEEK AGO, HAS BEEN OUT OF WORK FOR 1 WEEK, SHE WAS TESTED & POSITIVE FOR THE FLU.  PATIENT FEELS NO BETTER & IS CONCERNED SHE MAY HAVE PNEUMONIA.  PATIENT IS REQUESTING THAT DR. HOPPER ORDER & SEND HER FOR A CHEST XRAY.  PLEASE CALL PATIENT. Initial call taken by: Magdalen Spatz [facility],  November 22, 2009 2:03 PM  Follow-up for Phone Call        left message to call office...................Marland KitchenFelecia Deloach CMA  November 22, 2009 2:21 PM  last OV 10-09-08  Additional Follow-up for Phone Call Additional follow up Details #1::        [facility] @ [facility]  Additional Follow-up by: Marga Melnick MD,  November 22, 2009 3:52 PM    Additional Follow-up for Phone Call Additional follow up Details #2::    Left message on machine informing patient order placed for her to have Chest Xray M-F 8:30am -4:30pm at [facility]. Patient to call if any additional questions or concerns Follow-up by: Shonna Chock,  November 22, 2009 4:13 PM

## 2010-11-14 NOTE — Assessment & Plan Note (Signed)
Summary: legs swelling/cbs   Vital Signs:  Patient profile:   65 year old female Weight:      179.0 pounds BMI:     33.39 Temp:     98.4 degrees F oral Pulse rate:   72 / minute Pulse rhythm:   regular BP sitting:   114 / 74  (right arm) Cuff size:   large  Vitals Entered By: Almeta Monas CMA Duncan Dull) (July 19, 2010 1:41 PM) CC: bilateral swelling to the feet and ankles and spot on the left cheek, wants labs/ pt fasting   History of Present Illness: Pt here c/o b/L swelling low ext and sob for a few weeks.  No CP.    Current Medications (verified): 1)  Citalopram Hydrobromide 40 Mg  Tabs .Marland Kitchen.. 1 By Mouth Once Daily 2)  Zyrtec Hives Relief 10 Mg Tabs (Cetirizine Hcl) .Marland Kitchen.. 1 By Mouth Once Daily As Needed 3)  Hyoscyamine Sulfate 0.125 Mg Subl (Hyoscyamine Sulfate) .Marland Kitchen.. 1 Under Tongue As Needed For Gi Symptoms 4)  Ranitidine Hcl 150 Mg Tabs (Ranitidine Hcl) .Marland Kitchen.. 1 Every 12 Hrs Pre Meals As Needed 5)  Furosemide 20 Mg Tabs (Furosemide) .Marland Kitchen.. 1 By Mouth Qam  Allergies (verified): 1)  ! Pcn 2)  ! Codeine 3)  ! Cipro  Past History:  Past Medical History: Last updated: 2010-03-22 +ANA, - RA factor 1997 Hyperlipidemia: NMR 2005: LDL 78(1301/930), HDL 61, TG 103. Framingham LDL = < 130. Chronic urticaria,1997, Dr Venancio Poisson GERD  Past Surgical History: Last updated: 22-Mar-2010 Cholecystectomy 2001 Colonoscopy negative  2001 & 2006 , done for diarrhea & rectal bleeding Hysterectomy & BSO; baldder tacking for prolapse /pain 2001 Appendectomy with Tubal ligation; G 2 P 2 Tonsillectomy  Family History: Last updated: March 22, 2010 Father: alcoholism, died of PNA Mother: lipids, anxiety/depression,HTN Siblings: sister: lipids M uncle: depression, polio ; PGM: died  107 ? MI  Social History: Last updated: 03-22-2010 Irregular exercise-yes:treadmill, Pilates Never Smoked Alcohol use-no Occupation: Lab / Sales executive Married  Risk Factors: Exercise: yes  (07/06/2008)  Risk Factors: Smoking Status: never (07/06/2008)  Family History: Reviewed history from 03/22/2010 and no changes required. Father: alcoholism, died of PNA Mother: lipids, anxiety/depression,HTN Siblings: sister: lipids M uncle: depression, polio ; PGM: died  57 ? MI  Social History: Reviewed history from 22-Mar-2010 and no changes required. Irregular exercise-yes:treadmill, Pilates Never Smoked Alcohol use-no Occupation: Lab / Sales executive Married  Review of Systems      See HPI  Physical Exam  General:  Well-developed,well-nourished,in no acute distress; alert,appropriate and cooperative throughout examination Lungs:  Normal respiratory effort, chest expands symmetrically. Lungs are clear to auscultation, no crackles or wheezes. Heart:  normal rate and no murmur.   Extremities:  left pretibial edema and right pretibial edema.   Skin:  Intact without suspicious lesions or rashes Psych:  Cognition and judgment appear intact. Alert and cooperative with normal attention span and concentration. No apparent delusions, illusions, hallucinations   Impression & Recommendations:  Problem # 1:  EDEMA (ICD-782.3)  Her updated medication list for this problem includes:    Furosemide 20 Mg Tabs (Furosemide) .Marland Kitchen... 1 by mouth qam  Orders: Venipuncture (19147) T-2 View CXR (71020TC) EKG w/ Interpretation (93000) Prescription Created Electronically [phone])  Discussed elevation of the legs, use of compression stockings, sodium restiction, and medication use.   Problem # 2:  SHORTNESS OF BREATH (ICD-786.05)  Orders: Venipuncture (21308) T-2 View CXR (71020TC) EKG w/ Interpretation (93000)  Complete Medication List: 1)  Citalopram  Hydrobromide 40 Mg Tabs  .Marland KitchenMarland Kitchen. 1 by mouth once daily 2)  Zyrtec Hives Relief 10 Mg Tabs (Cetirizine hcl) .Marland Kitchen.. 1 by mouth once daily as needed 3)  Hyoscyamine Sulfate 0.125 Mg Subl (Hyoscyamine sulfate) .Marland Kitchen.. 1 under tongue as needed for  gi symptoms 4)  Ranitidine Hcl 150 Mg Tabs (Ranitidine hcl) .Marland Kitchen.. 1 every 12 hrs pre meals as needed 5)  Furosemide 20 Mg Tabs (Furosemide) .Marland Kitchen.. 1 by mouth qam  Other Orders: Admin 1st Vaccine (82956) Flu Vaccine 18yrs + (21308) Flu Vaccine Consent Questions     Do you have a history of severe allergic reactions to this vaccine? no    Any prior history of allergic reactions to egg and/or gelatin? no    Do you have a sensitivity to the preservative Thimersol? no    Do you have a past history of Guillan-Barre Syndrome? no    Do you currently have an acute febrile illness? no    Have you ever had a severe reaction to latex? no    Vaccine information given and explained to patient? yes    Are you currently pregnant? no    Lot Number:[id number]   Exp Date:04/12/2011   Site Given  Left Deltoid IM Flu Vaccine 62yrs + (65784)  Patient Instructions: 1)  Please schedule a follow-up appointment in 2 weeks with Hopp to go over labs and f/u edema Prescriptions: FUROSEMIDE 20 MG TABS (FUROSEMIDE) 1 by mouth qam  #30 x 1   Entered and Authorized by:   Loreen Freud DO   Signed by:   Loreen Freud DO on 07/19/2010   Method used:   [facility]ron[facility] to        [facility]  [address] [phone][facility] (retail)       [address]       [place], [place]  [address]       Ph: [phone]       Fax: [phone]   RxID:   [id number]  .lbflu

## 2010-11-20 NOTE — Progress Notes (Signed)
Summary: 54 DAY SUPPLY  Phone Note Refill Request Call back at [phone] Message from:  Pharmacy on November 11, 2010 8:48 AM  Refills Requested: Medication #1:  FUROSEMIDE 20 MG TABS 1 by mouth qam.   Dosage confirmed as above?Dosage Confirmed   Supply Requested: 3 months THIS REQUEST IS FOR A 90 DAY SUPPLY  Next Appointment Scheduled: 2.24.12 Initial call taken by: Lavell Islam,  November 11, 2010 8:48 AM    Prescriptions: FUROSEMIDE 20 MG TABS (FUROSEMIDE) 1 by mouth qam  #90 x 0   Entered by:   Shonna Chock CMA   Authorized by:   Marga Melnick MD   Signed by:   Shonna Chock CMA on 11/11/2010   Method used:   [facility]        [id number]       [address]       [place], [place]  [address]       Ph: [phone]       Fax: [phone]   RxID:   [id number]

## 2010-11-27 ENCOUNTER — Telehealth (INDEPENDENT_AMBULATORY_CARE_PROVIDER_SITE_OTHER): Payer: Self-pay | Admitting: *Deleted

## 2010-12-04 NOTE — Progress Notes (Signed)
Summary: Furosemide refill--wants 90 day supply  Phone Note Refill Request Message from:  Fax from Pharmacy on November 27, 2010 4:51 PM  Refills Requested: Medication #1:  FUROSEMIDE 20 MG TABS 1 by mouth qam. [id number], [address], [place], [place]  [phone],   fax - [phone]    QTY = 90     Patient requests 90 day supply  Initial call taken by: Jerolyn Shin,  November 27, 2010 5:10 PM  Follow-up for Phone Call        Rx was never received on 11/11/2010, I called in to the pharmacy #90/1 refill Follow-up by: Shonna Chock CMA,  November 28, 2010 9:31 AM    Prescriptions: FUROSEMIDE 20 MG TABS (FUROSEMIDE) 1 by mouth qam  #90 x 1   Entered by:   Shonna Chock CMA   Authorized by:   Marga Melnick MD   Signed by:   Shonna Chock CMA on 11/28/2010   Method used:   Telephoned to ...       [facility]  [address] [id number]       [address]       [place], [place]  [address]       Ph: [phone]       Fax: [phone]   RxID:   [id number]

## 2010-12-06 ENCOUNTER — Other Ambulatory Visit: Payer: Self-pay | Admitting: Internal Medicine

## 2010-12-06 ENCOUNTER — Encounter (INDEPENDENT_AMBULATORY_CARE_PROVIDER_SITE_OTHER): Payer: Self-pay | Admitting: *Deleted

## 2010-12-06 ENCOUNTER — Other Ambulatory Visit (INDEPENDENT_AMBULATORY_CARE_PROVIDER_SITE_OTHER): Payer: Medicare Other

## 2010-12-06 DIAGNOSIS — Z0000 Encounter for general adult medical examination without abnormal findings: Secondary | ICD-10-CM

## 2010-12-06 DIAGNOSIS — E785 Hyperlipidemia, unspecified: Secondary | ICD-10-CM

## 2010-12-06 LAB — LIPID PANEL
HDL: 39 mg/dL — ABNORMAL LOW (ref >39.00)
Triglycerides: 318 mg/dL — ABNORMAL HIGH (ref 0.0–149.0)

## 2010-12-06 LAB — LDL CHOLESTEROL, DIRECT: Direct LDL: 190.1 mg/dL

## 2010-12-13 ENCOUNTER — Encounter: Payer: Self-pay | Admitting: Internal Medicine

## 2010-12-13 ENCOUNTER — Ambulatory Visit (INDEPENDENT_AMBULATORY_CARE_PROVIDER_SITE_OTHER): Payer: Medicare Other | Admitting: Internal Medicine

## 2010-12-13 DIAGNOSIS — E785 Hyperlipidemia, unspecified: Secondary | ICD-10-CM

## 2010-12-19 NOTE — Assessment & Plan Note (Signed)
Summary: FOLLOW UP ON LABS/KN   Vital Signs:  Patient profile:   65 year old female Weight:      182 pounds BMI:     33.95 Pulse rate:   60 / minute Resp:     14 per minute BP sitting:   114 / 70  (left arm) Cuff size:   large  Vitals Entered By: Shonna Chock CMA (December 13, 2010 10:30 AM) CC: Follow-up visit: Discuss labs (copy given)    Primary Care Provider:  Alwyn Ren  CC:  Follow-up visit: Discuss labs (copy given) .  History of Present Illness:    Labs reviewed & risks discussed ; role of HFCS in raising Tg discussed. Serial NMRs reviewed.She  denies the following symptoms: chest pain/pressure, dypsnea, palpitations, syncope, and pedal edema.  The patient reports no exercise.  Adjunctive measures currently used by the patient include fiber.   "I don't think statins are good for you".  Current Medications (verified): 1)  Citalopram Hydrobromide 40 Mg Tabs (Citalopram Hydrobromide) .... 1/2-1 By Mouth At Bedtime 2)  Zyrtec Hives Relief 10 Mg Tabs (Cetirizine Hcl) .Marland Kitchen.. 1 By Mouth Once Daily As Needed 3)  Ranitidine Hcl 150 Mg Tabs (Ranitidine Hcl) .Marland Kitchen.. 1 Every 12 Hrs Pre Meals As Needed 4)  Furosemide 20 Mg Tabs (Furosemide) .Marland Kitchen.. 1 By Mouth Qam 5)  Aspirin 325 Mg Tabs (Aspirin) .... Patient Takes As Needed  Allergies: 1)  ! Pcn 2)  ! Codeine 3)  ! Cipro  Past History:  Past Medical History: +ANA, - RA factor 1997 Hyperlipidemia: NMR 2005: LDL 78 (1301/930), HDL 61, TG 103. Framingham LDL = < 130. Chronic urticaria,1997, Dr Venancio Poisson GERD  Family History: Father: alcoholism, died of PNA Mother: lipids, anxiety/depression,HTN Siblings: sister: lipids M uncle: depression, polio ; PGM: died  81 ? MI  Physical Exam  General:  well-nourished,in no acute distress; alert,appropriate and cooperative throughout examination Heart:  Normal rate and regular rhythm. S1 and S2 normal without gallop, murmur, click, rub .S4 Pulses:  R and L carotid,radial,dorsalis pedis and  posterior tibial pulses are full and equal bilaterally   Impression & Recommendations:  Problem # 1:  HYPERLIPIDEMIA (ICD-272.4)  Complete Medication List: 1)  Citalopram Hydrobromide 40 Mg Tabs (Citalopram hydrobromide) .... 1/2-1 by mouth at bedtime 2)  Zyrtec Hives Relief 10 Mg Tabs (Cetirizine hcl) .Marland Kitchen.. 1 by mouth once daily as needed 3)  Ranitidine Hcl 150 Mg Tabs (Ranitidine hcl) .Marland Kitchen.. 1 every 12 hrs pre meals as needed 4)  Furosemide 20 Mg Tabs (Furosemide) .Marland Kitchen.. 1 by mouth qam 5)  Aspirin 325 Mg Tabs (Aspirin) .... Patient takes as needed  Patient Instructions: 1)  Low carb ( [employer] New Sugar [employer]usters) & avoid HFCS sugar as discussed. 2)  Please schedule a follow-up  fasting lab appointment in 4 months: [facility] Lipid Panel (1304X), ICD-9:272.4, V17.3   Orders Added: 1)  Est. Patient Level III [13086]

## 2011-01-20 ENCOUNTER — Telehealth: Payer: Self-pay | Admitting: Internal Medicine

## 2011-01-20 NOTE — Telephone Encounter (Signed)
Left msg on voicemail for return call. 

## 2011-01-20 NOTE — Telephone Encounter (Signed)
Patient works in [employer] ---a nurse will come in to her office on Tuesday 4/10 to give shots---wants to know if she is up to date on her Hep and TB test; also stuck her hand with something over weekend , so she needs to check to see if she is up to date on her Tetanus as well---please call [phone]

## 2011-01-22 NOTE — Telephone Encounter (Signed)
Left msg on voicemail.

## 2011-01-24 ENCOUNTER — Other Ambulatory Visit: Payer: Self-pay | Admitting: Internal Medicine

## 2011-02-28 NOTE — H&P (Signed)
[facility]  Patient:    Pam Butler, Pam Butler [facility]                   MRN: [id number] Adm. Date:  [id number] Disc. Date: [id number] Attending:  Henrene Dodge                         History and Physical  CHIEF COMPLAINT:  Pelvic relaxation and urinary incontinence.  HISTORY OF PRESENT ILLNESS:  This is a 65 year old white female, gravida 2, para 2-0-0-2, with a several-year history of pelvic pressure and urine leakage.  I last saw her on December 30, 1999.  At that time, she had no postmenopausal bleeding while taking Prempro.  She complained of leaking urine with walking, straining and some urinary urgency; she also feels some pelvic pressure.  On exam, she has a grade 1 to 2 cystocele and a grade 1 rectocele. She has a small mid-planar nontender uterus; she also has a hypermobile urethra.  She has been evaluated by Dr. Verl Dicker, and has been determined to have stress incontinence.  She has tried physical therapy and medications for these problems in the past and now wishes to proceed with definitive surgical therapy.  PAST OBSTETRICAL HISTORY:  Significant for two vaginal deliveries without complications.  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  PAST SURGICAL HISTORY:  Recent laparoscopic cholecystectomy, right breast biopsy, tubal ligation and appendectomy.  ALLERGIES:  CIPRO, PENICILLIN, ZITHROMAX and CODEINE.  CURRENT MEDICATIONS 1. Prempro 2.5 mg. 2. Zyrtec one p.o. q.h.s. 3. Aciphex 10 mg p.o. q.a.m.  SOCIAL HISTORY:  Patient is married and she denies alcohol, tobacco or drug use.  REVIEW OF SYSTEMS:  Review of systems is otherwise negative.  FAMILY HISTORY:  No GYN or colon cancer.  PHYSICAL EXAMINATION  GENERAL:  Weight is approximately 165 pounds.  Generally, she is a well-developed, well-nourished white female who is in no acute  distress.  HEENT:  Pupils are equally round and reactive to light and accommodation.  Her extraocular muscles are intact.  Her oropharynx is without erythema or exudate.  NECK:  Supple without lymphadenopathy or thyromegaly.  LUNGS:  Clear to auscultation.  HEART:  Regular rate and rhythm without murmur.  ABDOMEN:  Soft, nontender, nondistended, without palpable masses.  She has well-healed laparoscopic incisions.  EXTREMITIES:  Extremities have trace edema, are nontender and DTRs are 1+/4 and symmetric.  PELVIC:  Exam reveals no external lesions and no significant perineal defect. On speculum exam, she has a normal-appearing cervix and her most recent Pap smear is normal.  She has a grade 1 to 2 cystocele and a grade 2 rectocele. On bimanual exam, uterus is small, mid-planar, nontender and on rectovaginal exam, this is confirmed.  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  Patient has been evaluated by Dr. Verl Dicker and determined to have stress urinary incontinence. Patient is admitted for definitive surgical therapy.  Risks of the procedure have been discussed with the patient including bleeding, infection, damage to surrounding organs including bladder, rectum and ureters.  Plan is to admit the patient for vaginal hysterectomy with bilateral salpingo-oophorectomy. She is also to have an anterior and posterior colporrhaphy and Dr. Wanda Plump will perform a pubovaginal sling. DD:  06/22/00 TD:  06/22/00 Job: [id number]

## 2011-02-28 NOTE — Op Note (Signed)
[facility]  Patient:    Pam Butler, Pam Butler [facility]                   MRN: [id number] Proc. Date: 06/23/00 Adm. Date:  [id number] Attending:  Ellwood Handler                           Operative Report  PREOPERATIVE DIAGNOSES: 1. Pelvic relaxation. 2. Stress urinary incontinence.  POSTOPERATIVE DIAGNOSES: 1. Pelvic relaxation. 2. Stress urinary incontinence.  PROCEDURE:  Transvaginal hysterectomy, bilateral salpingo-oophorectomy, anterior and posterior colporrhaphy.  SURGEON:  Zenaida Niece, M.D.  ASSISTANTS: 1. Alvino Chapel, M.D. 2. Verl Dicker, M.D.  ANESTHESIA:  General endotracheal tube.  ESTIMATED BLOOD LOSS:  500 cc.  FINDINGS:  Grade 1 cystocele, grade 2 rectocele.  Normal uterus and ovaries. Chemoprophylaxis was with Ancef 1 g.  COUNTS:  Correct.  CONDITION:  Stable.  DESCRIPTION OF PROCEDURE:  After appropriate informed consent was obtained, the patient was taken to the operating room and placed in the dorsal supine position.  General anesthesia was induced and she was placed in mobile stirrups.  Her lower abdomen, perineum and vagina were then prepped and draped in a usual sterile fashion, and her bladder was drained with a red rubber catheter.  A weighted-speculum was inserted into the vagina and Deaver retractors used anteriorly and laterally.  The cervix was grasped with Christella Hartigan tenaculum and the cervical vaginal mucosa was incised with electrocautery. The anterior vaginal mucosa was then dissected sharply with scissors and the bladder pushed anteriorly.  The Deaver was used to retract the bladder anteriorly although the peritoneum was not entered at this point.  The posterior cul-de-sac was grasped with a Kocher clamp and entered sharply with scissors.  A banana speculum was then placed into the peritoneal cavity.  The uterosacral ligaments were clamped, transected and ligated on each side with #1  chromic.  The bladder was pushed further anteriorly, and the anterior peritoneum was identified and entered sharply.  The Deaver retractor was then used to retract the bladder anteriorly.  The uterine arteries and cardinal ligaments were clamped, transected and ligated on each side with #1 chromic. The lower broad ligaments were clamped, transected and ligated likewise with #1 chromic.  The uterus was then able to be delivered through the vagina, and both the utero-ovarian pedicles were clamped, transected and ligated with #1 chromic.  Both tubes and ovaries were then able to be identified and grasped with Babcock clamps.  They were separately isolated the infundibulopelvic ligaments were clamped with right ankle Sebulon clamps.  The tubes and ovaries were removed, and these pedicles were doubly ligated with #1 chromic with adequate hemostasis.  The peritoneum was then identified and run to the vaginal cuff with a running locking suture of 0 Vicryl with adequate hemostasis.  All pedicles were inspected and found to be hemostatic.  Attention was then turned to the anterior colporrhaphy.  The vaginal apex was grasped with Allis clamps, and the anterior vaginal mucosa was dissected in the midline to the vaginal apex to within 1 cm from the urethra.  This was then freed up laterally with sharp and blunt dissection to free the vaginal mucosa and identify the cystocele.  Dr. Wanda Plump then entered the field and placed the pubovaginal sling.  The remainder of the cystocele was reduced with two interrupted sutures of 2-0 Vicryl.  The excess vaginal mucosa was removed and the vagina was  closed with running locking suture of 2-0 Vicryl from the urethral meatus to the vaginal apex.  This achieved adequate reduction of the cystocele.  The uterosacral ligaments were then plicated in the midline and with an interrupted suture of 2-0 silk.  The remainder of the vagina was closed with a running locking  suture of 2-0 Vicryl.  There was excellent support of the vaginal vault.  Attention was then turned to the posterior colporrhaphy.  The hymenal ring was grasped with Allis clamps in a point that would achieve adequate closure of the introitus.  A diamond-shaped piece of vaginal mucosa and external skin was removed sharply.  The vaginal mucosa was then dissected in the midline up to the vaginal apex and then the vaginal mucosa was dissected laterally both sharply and bluntly to mobilize the rectocele.  A finger was inserted into the rectum, and there was found to be no rectal damage.  The rectocele was then reduced with interrupted sutures of 2-0 Vicryl.  A finger was again inserted into the rectum and confirmed no rectal damage and no sutures in the rectum. Excess vaginal mucosa was removed and the vagina was closed in a running locking fashion with 2-0 Vicryl to the hymenal ring.  The bulla cavernosus muscles were reapproximated in the midline with 2-0 Vicryl and the remainder of the external skin was closed with running 3-0 Vicryl.  At this time, all site were found to be hemostatic.  The vagina was packed with 2 inch Iodoform gauze after Dr. Wanda Plump placed a Foley catheter.  The patient tolerated the procedure well and was taken to the recovery room in stable condition.DD: 06/23/00 TD:  06/24/00 Job: [id number]

## 2011-02-28 NOTE — Discharge Summary (Signed)
[facility]  Patient:    Pam Butler, [facility]                   MRN: [id number] Adm. Date:  [id number] Disc. Date: [id number] Attending:  Ellwood Handler CC:         Verl Dicker, M.D.   Discharge Summary  ADMISSION DIAGNOSES: 1. Pelvic relaxation. 2. Stress urinary incontinence.  DISCHARGE DIAGNOSES: 1. Pelvic relaxation. 2. Stress urinary incontinence.  PROCEDURES:  Total vaginal hysterectomy, bilateral salpingo-oophorectomy, anterior and posterior colporrhaphy, and pubovaginal sling per Dr. [place] Service.  COMPLICATIONS:  None.  CONSULTATIONS:  Dr. [place] Service.  BRIEF HISTORY AND PHYSICAL EXAMINATION:  Briefly, this is a 65 year old white female, gravida 2, para 2, with a several-year history of pelvic pressure and urine leakage.  On exam she has a grade 1-2 cystocele and a grade 1 rectocele, a normal size uterus, and a hypermobile urethra.  She has been evaluated by Dr. Wanda Plump and has been determined to have stress urinary incontinence and is admitted for surgical therapy.  PAST MEDICAL HISTORY:  Significant for two vaginal deliveries, gastroesophageal reflux disease, allergies, and a history of superficial thrombophlebitis.  PAST SURGICAL HISTORY:  Significant for recent laparoscopic cholecystectomy, right breast biopsy, tubal ligation, and appendectomy.  ALLERGIES:  Allergies to CIPRO, PENICILLIN, ZITHROMAX, and CODEINE.  MEDICATIONS:  Prempro, Zyrtec, Aciphex, Prozac, and Beconase.  PHYSICAL EXAMINATION ON ADMISSION:  ABDOMEN:  Significant for a benign abdomen with well-healed laparoscopic incisions.  PELVIC:  External genitalia is within normal limits.  On speculum exam she has a normal-appearing cervix and a grade 1-2 cystocele and a grade 2 rectocele and on bimanual exam she has a small midplanar nontender uterus and on rectovaginal exam this is confirmed.  HOSPITAL COURSE:  The patient was  admitted on the day of surgery and underwent the above-mentioned procedures by myself and Dr. Wanda Plump without complications.  This was done under general anesthesia with a estimated blood loss of 500 cc.  Postoperatively, she did well with some mild nausea.  She did require IV pain medication on the morning of postoperative day #1.  Her vaginal packing was removed with moderate stain on postoperative day #1. Presurgical hemoglobin was 12.9 and postsurgical was 10.3.  Gradually throughout the day on postoperative day #1 she was able to ambulate, but still did not have adequate pain control.  Overnight on postoperative day #1 she had a temperature to 101.5.  She had no significant findings on physical exam. She remained afebrile throughout the day on postoperative day #2 and on the evening of postoperative day #2 was felt to be stable enough for discharge home.  CONDITION ON DISCHARGE:  Stable.  DISPOSITION:  Discharged to home.  LABORATORY:  Final pathology revealed benign cervix, small benign endometrial polyp, normal myometrium and serosa, and normal ovaries and fallopian tubes.  DISCHARGE INSTRUCTIONS:  Diet:  Regular diet.  Activity:  Pelvic rest, no strenuous activity, and no driving.  She is also to take her temperature twice a day and call for any temperature greater than 101.  FOLLOWUP:  Follow-up is in one week with Dr. Wanda Plump and in two weeks with myself.  DISCHARGE MEDICATIONS:  Percocet p.r.n. pain.  DD:  06/25/00 TD:  06/28/00 Job: [id number]

## 2011-02-28 NOTE — Op Note (Signed)
[facility]  Patient:    Pam Butler, Pam Butler [facility]                   MRN: [id number] Proc. Date: 04/09/00 Adm. Date:  [id number] Disc. Date: [id number] Attending:  Henrene Dodge CC:         Anselm Pancoast. Zachery Dakins, M.D.             Titus Dubin. Alwyn Ren, M.D. [facility]                           Operative Report  PREOPERATIVE DIAGNOSIS:  Chronic cholelithiasis.  POSTOPERATIVE DIAGNOSIS:  Chronic cholelithiasis.  OPERATION:  Laparoscopic cholecystectomy with cholangiogram.  ANESTHESIA:  General.  SURGEON:  Anselm Pancoast. Zachery Dakins, M.D.  ASSISTANT:  Abigail Miyamoto, M.D.  HISTORY:  Pam Butler is a 65 year old Caucasian female who works as a ______ has had repeated episodes of epigastric, sort of substernal pain, and has been treated for esophageal reflux with ______ endoscopy and then because of basically persistent symptoms, patient then had an ultrasound of the gallbladder.  This showed multiple small stones.  Her liver function studies were normal and she was referred to me by Dr. Titus Dubin. Hopper for a cholecystectomy.  On physical exam, she was not acutely tender when I saw her in the office.  Preoperatively, she was given a gram of Keflex -- she is "allergic to penicillin" -- and then had PAS stockings.  She had a little superficial thrombophlebitis recently but these are really minimally if any symptoms at this time.  There has been no evidence of leg swelling or deep pain like a deep thrombophlebitis.  DESCRIPTION OF PROCEDURE:  Patient was taken to the operating suite with induction of general anesthesia, endotracheal tube ______ to the stomach and then the abdomen prepped with Betadine surgical scrubbing solution and draped in a sterile manner.  Patients small incision was made below the umbilicus. She has had a previous lower midline incision from a gynecologic procedure, but the fascia was then divided.  The posterior rectus fascia and  peritoneum were identified and picked up.  After a small opening made into the peritoneal cavity, the traction suture was placed and then the Hasson cannula introduced and the camera inserted and the CO2 infusion.  The upper 10-mm trocar was placed after anesthetizing the fascia in the subxiphoid area and the two lateral 5-mm trocars were placed after anesthetizing this at the appropriate positions.  The gallbladder was quite tense, not acutely inflamed.  There were adhesions around and these were kind of carefully stripped away.  At the proximal gallbladder, you could see the stones through the thin wall of the gallbladder and it was noted that she had a very short cystic duct of less than a centimeter in size before it entered into the ______ .  A clip was placed flush with the gallbladder and a small opening made in this little short cystic duct and Taut catheter introduced and x-ray obtained that showed good prompt filling into the duodenum with no evidence of any extrahepatic dilatation.  The little short cystic duct was doubly clipped and divided and then the inferior and posterior branches of the cystic artery were identified and doubly clipped proximally and then divided and then the gallbladder freed from its bed.  There was a little bile spillage from the clip that was on the gallbladder/cytic duct where it had been transected, but we placed an EndoCatch bag  with no spillage of the stones.  The right upper quadrant was thoroughly irrigated, no evidence of any bleeding, and then the gallbladder was brought out at the umbilicus.  The fascia at the umbilicus was closed with two figure-of-eights of Vicryl and then the two lateral 5-mm trocars were withdrawn under direct vision with good hemostasis.  At last check, there was no evidence of any bleeding.  All the irrigating fluid had been aspirated and the CO2 released and then the upper 10-mm trocar carefully withdrawn. Subcutaneous  wounds were closed with 4-0 Vicryl and Benzoin and Steri-Strips on the skin.  Patient tolerated the procedure nicely and was taken to the recovery room in a stable postop condition. DD:  04/09/00 TD:  04/10/00 Job: [id number]

## 2011-02-28 NOTE — Op Note (Signed)
[facility]  Patient:    Pam Butler, Pam Butler [facility]                   MRN: [id number] Proc. Date: 06/23/00 Adm. Date:  [id number] Attending:  Ellwood Handler CC:         Zenaida Niece, M.D.   Operative Report  DATE OF BIRTH:  03/08/1946  PREOPERATIVE DIAGNOSIS:  The patient is a 65 year old female with progressive symptoms of stress incontinence.  No significant improvement with medical therapy, Imipramine, or Detrol.  Cystoscopy showed 275 cc capacity bladder, decensus with straining, no uninhibited contractions, positive Marshall test supine and upright.  INFORMED CONSENT:  Discussed with patient the risks and benefits of pubovaginal sling including bleeding, infection, injury to nerve, blood vessel, bowel, or adjacent viscera, and the chance that the sling may be either too tight or too loose.  The patient understands and agrees to proceed.  PROCEDURE:  Pubovaginal sling.  ANESTHESIA:  General.  DRAINS:  16 French Foley.  ADDENDUM:  Dr. Lavina Hamman will dictate his portion of the procedure including vaginal hysterectomy, bilateral salpingo-oophorectomy, anterior repair, and posterior repair.  DESCRIPTION OF PROCEDURE:  Once Dr. Jackelyn Knife had completed vaginal hysterectomy, bilateral salpingo-oophorectomy, and initiated an anterior repair, blunt dissection with tip of the index finger was used to create a space posterior to the symphysis pubis lateral to the bladder neck.  24 French Foley catheter was indwelling.  Transverse incision was then made suprapubically immediately above the symphysis pubis, carried down to the level of the fascia.  A single Stamey needle was then passed suprapubically. It immediately met the tip of the index finger which was in good position in the retropubic space and was guided down to an appropriate space next to the bladder neck.  A similar technique was used on both the right and left sides. A  prepared piece of cadaveric fascia, 3 cm x 12 cm, with #1 nylon whip stitch at either end was then brought through the eyelet of the Stamey and retracted superiorly.  French-eye needle was then used to displace the lateral portion of the #1 nylon to a spot slightly lateral within the fascia.  Cystoscope was introduced.  Bladder neck showed normal anatomy, mild decensus with cough, normal trigonal anatomy, right and left ureteral orifice, catheterized easily using a 5 spiral-tip catheter which passed without resistance at 20-25 cm.  No evidence of bladder perforation or bleeding.  Bladder was then drained.  Tip of a Debakey forcep was then kept between the sling and the urethra.  Sling was tied down with no undue tension.  Tails of the nylon stitches were then sewn together in the midline.  Skin of the suprapubic incision was closed with skin staples.  Dr. Jackelyn Knife will dictate his portion of the procedure. DD:  06/23/00 TD:  06/24/00 Job: [id number]

## 2011-04-11 ENCOUNTER — Other Ambulatory Visit: Payer: Medicare Other

## 2011-04-18 ENCOUNTER — Other Ambulatory Visit: Payer: Medicare Other

## 2011-04-18 NOTE — Progress Notes (Signed)
Labs only

## 2011-05-06 ENCOUNTER — Encounter: Payer: Self-pay | Admitting: Internal Medicine

## 2011-06-28 ENCOUNTER — Other Ambulatory Visit: Payer: Self-pay | Admitting: Internal Medicine

## 2011-07-04 ENCOUNTER — Encounter: Payer: Self-pay | Admitting: Internal Medicine

## 2011-07-04 ENCOUNTER — Ambulatory Visit (INDEPENDENT_AMBULATORY_CARE_PROVIDER_SITE_OTHER): Payer: Medicare Other | Admitting: Internal Medicine

## 2011-07-04 DIAGNOSIS — K589 Irritable bowel syndrome without diarrhea: Secondary | ICD-10-CM

## 2011-07-04 DIAGNOSIS — E785 Hyperlipidemia, unspecified: Secondary | ICD-10-CM

## 2011-07-04 NOTE — Progress Notes (Signed)
  Subjective:    Patient ID: Pam Butler, female    DOB: [birth year], 65 y.o.   MRN: [id number]  HPI Dyslipidemia assessment: Prior Advanced Lipid Testing: NMR 2005.LDL goal = < 100, ideally < 70.   Family history of premature CAD/ MI: PGM MI @ 27 .  Nutrition: no plan, modified low carb .  Exercise: intermittently . Diabetes : no .A1c 5.7% HTN: no. Smoking history  : never .   Weight :  stable. ROS: fatigue: some ; chest pain : no ;claudication: no; palpitations: no; abd pain/bowel changes: IBS symptoms, mainly loose stool ; myalgias:some  L forearm, non exertional;  syncope : no ; memory loss: no;skin changes: no. Lab results reviewed :[place] Heart Panel: not hyperabsorber.LDL 188, TG 549;T/T genotype .     Review of Systems     Objective:   Physical Exam General appearance is one of good health and nourishment w/o distress.  Eyes: No conjunctival inflammation or scleral icterus is present.  Oral exam: Dental hygiene is good; lips and gums are healthy appearing.There is no oropharyngeal erythema or exudate noted.   Neck: thyroid normal  Heart:  Normal rate and regular rhythm. S1 and S2 normal without gallop, murmur, click, rub .S4  Lungs:Chest clear to auscultation; no wheezes, rhonchi,rales ,or rubs present.No increased work of breathing.   Abdomen: bowel sounds normal, soft and non-tender without masses, organomegaly or hernias noted.  No guarding or rebound . No AAA or bruits  Skin:Warm & dry.  Intact without suspicious lesions or rashes ; no jaundice  Vasc: all pulses intact  Neuro:oriented X3; DTRs WNL  Lymphatic: No lymphadenopathy is noted about the head, neck, axilla areas.             Assessment & Plan:  #1 dyslipidemia; risk outlined above. Major issue appears to be dietary with dramatic elevation of the triglycerides. This will be associated with the risk of pancreatitis.  She describes subjective intolerance to statins in the past. Her genotype suggests  normal ability to metabolize statins.  Plan: Aggressive nutritional intervention would be recommended with repeat lipids in 3-4 months. Risk will be reassessed at that time.

## 2011-07-04 NOTE — Patient Instructions (Signed)
Eat a low-fat diet with lots of fruits and vegetables, up to 7-9 servings per day. Avoid obesity; your goal is waist measurement < 40 inches.Consume less than 40 grams of sugar per day from foods & drinks with High Fructose Corn Sugar as #1,2,3 or # 4 on label. Follow the low carb nutrition program in [employer] as closely as possible to prevent Diabetes progression & complications. White carbohydrates (potatoes, rice, bread, and pasta) have a high spike of sugar and a high load of sugar. For example a  baked potato has a cup of sugar and a  french fry  2 teaspoons of sugar. Yams, wild  rice, whole grained bread &  wheat pasta have been much lower spike and load of  sugar. Portions should be the size of a deck of cards or your palm. Exercise  30-45  minutes a day, 3-4 days a week. Walking is especially valuable in preventing Osteoporosis. Please take the probiotic , Align, every day until the bowels are normal. This will replace the normal bacteria which  are necessary for formation of normal stool and processing of food.  Please  schedule fasting Labs in 3-4 months: Lipids (272.4)

## 2011-10-27 ENCOUNTER — Other Ambulatory Visit: Payer: Self-pay | Admitting: Internal Medicine

## 2012-03-15 ENCOUNTER — Encounter: Payer: Self-pay | Admitting: Internal Medicine

## 2012-03-15 ENCOUNTER — Ambulatory Visit (INDEPENDENT_AMBULATORY_CARE_PROVIDER_SITE_OTHER): Payer: Medicare Other | Admitting: Internal Medicine

## 2012-03-15 VITALS — BP 120/70 | HR 89 | Temp 98.1°F | Wt 194.0 lb

## 2012-03-15 DIAGNOSIS — E785 Hyperlipidemia, unspecified: Secondary | ICD-10-CM

## 2012-03-15 LAB — TSH: TSH: 1.98 u[IU]/mL (ref 0.35–5.50)

## 2012-03-15 NOTE — Progress Notes (Signed)
  Subjective:    Patient ID: Pam Butler, female    DOB: April 11, 1946, 66 y.o.   MRN: [id number]  HPI Dyslipidemia assessment: Prior Advanced Lipid Testing: both NMR & BHP.   Family history of premature CAD/ MI: no .  Nutrition:stopped Sugar Busters 1 year ago .  Exercise: quit 11 mos ago . Diabetes : A1c 5.7% . HTN: no. Smoking history  : never.    Lab results reviewed : Serial labs were reviewed; there is been dramatic variability in her triglycerides with values over 500 at times. As expected the HDL dropped and LDL increased with TG elevation.     Review of Systems Weight :  Up 20 # in past 12 mos  fatigue: some ; chest pain :no ;claudication: no; palpitations: no; abd pain/bowel changes: IBS variability ; myalgias:no;  syncope : no ; memory loss:no;skin changes: no.   Twice last week she noticed momentary drawing of the left lateral eye. This is not associated with any visual changes such as blurred vision, double vision, or loss of vision. There were no other associated neurologic symptoms or signs      Objective:   Physical Exam  She appears healthy and well-nourished; she is in no acute distress  No carotid bruits are present.  Extraocular motions intact. Vision is normal to reading the wall chart  Thyroid is normal without enlargement or nodularity  Heart rhythm and rate are normal with no significant murmurs or gallops. S 4  Chest is clear with no increased work of breathing  There is no evidence of aortic aneurysm or renal artery bruits. Slight dullness to percussion right upper quadrant without definite organomegaly  She has no clubbing or edema.  Deep tendon reflexes are equal and normal   Pedal pulses are intact   No ischemic skin changes are present         Assessment & Plan:

## 2012-03-15 NOTE — Patient Instructions (Signed)
Preventive Health Care: Exercise  30-45  minutes a day, 3-4 days a week. Walking is especially valuable in preventing Osteoporosis. The most common cause of elevated triglycerides is the ingestion of sugar from high fructose corn syrup sources added to processed foods & drinks.  Eat a low-fat diet with lots of fruits and vegetables, up to 7-9 servings per day. Consume less than 30 (preferably ZERO) grams of sugar per day from foods & drinks with High Fructose Corn Syrup (HFCS) sugar as #1,2,3 or # 4 on label.Whole [employer], [employer] & [employer] do not carry products with HFCS. Follow a  low carb nutrition program such as [place] or [employer]  to prevent Diabetes progression . White carbohydrates (potatoes, rice, bread, and pasta) have a high spike of sugar and a high load of sugar. For example a  baked potato has a cup of sugar and a  french fry  2 teaspoons of sugar. Yams, wild  rice, whole grained bread &  wheat pasta have been much lower spike and load of  sugar. Portions should be the size of a deck of cards or your palm.  Please  schedule fasting Labs in 3 months : BMET,Lipids, hepatic panel, A1c. PLEASE BRING THESE INSTRUCTIONS TO FOLLOW UP  LAB APPOINTMENT.This will guarantee correct labs are drawn, eliminating need for repeat blood sampling ( needle sticks ! ). Diagnoses /Codes: 277.7,780.79

## 2012-03-15 NOTE — Assessment & Plan Note (Addendum)
The pathophysiology of metabolic syndrome  was discussed along with lifestyle interventions.  Triglycerides over 500 can be associated with Pancreatitis with health threatening complications. Please follow nutritional interventions as discussed

## 2012-03-19 ENCOUNTER — Other Ambulatory Visit: Payer: Self-pay | Admitting: Internal Medicine

## 2012-03-30 ENCOUNTER — Other Ambulatory Visit: Payer: Self-pay | Admitting: Internal Medicine

## 2012-12-16 ENCOUNTER — Other Ambulatory Visit: Payer: Self-pay | Admitting: Internal Medicine

## 2012-12-22 ENCOUNTER — Ambulatory Visit (INDEPENDENT_AMBULATORY_CARE_PROVIDER_SITE_OTHER): Payer: Medicare Other | Admitting: Internal Medicine

## 2012-12-22 ENCOUNTER — Telehealth: Payer: Self-pay | Admitting: Internal Medicine

## 2012-12-22 ENCOUNTER — Encounter: Payer: Self-pay | Admitting: Internal Medicine

## 2012-12-22 VITALS — BP 98/68 | HR 81 | Temp 99.6°F | Wt 197.0 lb

## 2012-12-22 DIAGNOSIS — J069 Acute upper respiratory infection, unspecified: Secondary | ICD-10-CM

## 2012-12-22 MED ORDER — DOXYCYCLINE HYCLATE 100 MG PO TABS: 100.0000 mg | ORAL_TABLET | Freq: Two times a day (BID) | ORAL | Status: DC

## 2012-12-22 NOTE — Progress Notes (Signed)
  Subjective:    Patient ID: Pam Butler, female    DOB: 03-01-1946, 67 y.o.   MRN: [id number]  HPI  Visit Symptoms started 3 days ago with mild cough with clear/yellow sputum, sore throat, chest congestion, very mild wheezing.  Past medical history Hyperlipidemia No history of asthma.  Social history No history of tobacco abuse   Review of Systems + Subjective fever, no chills. No nausea or vomiting. Occasional diarrhea, not a new issue. No myalgias No sinus pain or congestion.     Objective:   Physical Exam BP 98/68  Pulse 81  Temp(Src) 99.6 F (37.6 C) (Tympanic)  Wt 197 lb (89.359 kg)  BMI 36.62 kg/m2  SpO2 95%  General -- alert, well-developed, BP slightly low but not toxic appearing.  Neck --  no LADs HEENT -- TMs normal, throat w/o redness, face symmetric and not tender to palpation, nose not congested  Lungs -- normal respiratory effort, no intercostal retractions, no accessory muscle use, and normal breath sounds. Few end expiratory wheezes, no crackles.   Heart-- normal rate, regular rhythm, no murmur, and no gallop.   Extremities-- no pretibial edema bilaterally  Neurologic-- alert & oriented X3 and strength normal in all extremities. Psych-- Cognition and judgment appear intact. Alert and cooperative with normal attention span and concentration.  not anxious appearing and not depressed appearing.      Assessment & Plan:  URI, Symptoms consistent with URI, minimal wheezing, no history of asthma. Plan: Conservative treatment, doxycycline if needed. Encouraged to call if she notices more wheezing, may need albuterol.

## 2012-12-22 NOTE — Telephone Encounter (Signed)
Patient scheduled to see Dr.Paz at 3:00 pm. Per Dr.Hopper's protocol if patient with scheduled appointment ok to close encounter

## 2012-12-22 NOTE — Telephone Encounter (Signed)
Patient Information:  Caller Name: Pam Butler  Phone: [phone]  Patient: Pam Butler  Gender: Female  DOB: 04-Jun-1946  Age: 67 Years  PCP: Marga Melnick  Office Follow Up:  Does the office need to follow up with this patient?: No  Instructions For The Office: N/A  RN Note:  Patient states she developed cough, chest congestion, onset 12/19/12. Patient states she developed intermittent mild wheezing 12/19/12. Patient states she is expectorating small amount of yellow sputum. Care advice given per guidelines. Patient advised increased fluids, inhaled steam, warm fluids with honey, humidifier. Patient advised may take OTC Mucinex as directed. Call back parameters reviewed. Patient verbalizes understanding.  Symptoms  Reason For Call & Symptoms: Cough, Chest congestion Meds: Citalopram, Celexa,  Zyrtec Hx: Allergies, Anxiety, Depression  Reviewed Health History In EMR: Yes  Reviewed Medications In EMR: Yes  Reviewed Allergies In EMR: Yes  Reviewed Surgeries / Procedures: Yes  Date of Onset of Symptoms: 12/19/2012  Treatments Tried: Dayquil  Treatments Tried Worked: No  Guideline(s) Used:  Cough  Disposition Per Guideline:   Go to Office Now  Reason For Disposition Reached:   Wheezing is present  Advice Given:  Coughing Spasms:  Drink warm fluids. Inhale warm mist (Reason: both relax the airway and loosen up the phlegm).  Prevent Dehydration:  Drink adequate liquids.  This will help soothe an irritated or dry throat and loosen up the phlegm.  Avoid Tobacco Smoke:  Smoking or being exposed to smoke makes coughs much worse.  Call Back If:  You become worse.  For a Stuffy Nose - Use Nasal Washes:  Introduction: Saline (salt water) nasal irrigation (nasal wash) is an effective and simple home remedy for treating stuffy nose and sinus congestion. The nose can be irrigated by pouring, spraying, or squirting salt water into the nose and then letting it run back  out.  Appointment Scheduled:  12/22/2012 15:00:00 Appointment Scheduled Provider:  Willow Ora

## 2012-12-22 NOTE — Patient Instructions (Addendum)
Rest, fluids , tylenol For cough, take Mucinex DM twice a day as needed  Take the antibiotic as prescribed  (doxycycline) only if no better in 3-4 days Call if no better in few days Call anytime if the symptoms are severe

## 2013-03-10 ENCOUNTER — Encounter: Payer: Self-pay | Admitting: Internal Medicine

## 2013-03-10 ENCOUNTER — Ambulatory Visit (INDEPENDENT_AMBULATORY_CARE_PROVIDER_SITE_OTHER): Payer: Medicare Other | Admitting: Internal Medicine

## 2013-03-10 VITALS — BP 124/80 | HR 72 | Temp 98.0°F | Resp 12 | Ht 61.08 in | Wt 192.0 lb

## 2013-03-10 DIAGNOSIS — E785 Hyperlipidemia, unspecified: Secondary | ICD-10-CM

## 2013-03-10 DIAGNOSIS — K589 Irritable bowel syndrome without diarrhea: Secondary | ICD-10-CM

## 2013-03-10 DIAGNOSIS — K219 Gastro-esophageal reflux disease without esophagitis: Secondary | ICD-10-CM

## 2013-03-10 DIAGNOSIS — L299 Pruritus, unspecified: Secondary | ICD-10-CM

## 2013-03-10 DIAGNOSIS — Z23 Encounter for immunization: Secondary | ICD-10-CM

## 2013-03-10 LAB — CBC WITH DIFFERENTIAL/PLATELET
Basophils Relative: 0.8 % (ref 0.0–3.0)
Eosinophils Absolute: 0.2 10*3/uL (ref 0.0–0.7)
Eosinophils Relative: 3.3 % (ref 0.0–5.0)
HCT: 36.3 % (ref 36.0–46.0)
Hemoglobin: 12.7 g/dL (ref 12.0–15.0)
Lymphs Abs: 2.1 10*3/uL (ref 0.7–4.0)
MCHC: 34.9 g/dL (ref 30.0–36.0)
MCV: 86.9 fl (ref 78.0–100.0)
Monocytes Absolute: 0.6 10*3/uL (ref 0.1–1.0)
Neutro Abs: 3.3 10*3/uL (ref 1.4–7.7)
Neutrophils Relative %: 52.5 % (ref 43.0–77.0)
RBC: 4.18 Mil/uL (ref 3.87–5.11)
WBC: 6.3 10*3/uL (ref 4.5–10.5)

## 2013-03-10 LAB — LIPID PANEL
HDL: 35.7 mg/dL — ABNORMAL LOW (ref >39.00)
Total CHOL/HDL Ratio: 7
Triglycerides: 448 mg/dL — ABNORMAL HIGH (ref 0.0–149.0)

## 2013-03-10 LAB — HEPATIC FUNCTION PANEL
ALT: 25 U/L (ref 0–35)
Alkaline Phosphatase: 57 U/L (ref 39–117)
Bilirubin, Direct: 0 mg/dL (ref 0.0–0.3)
Total Bilirubin: 0.5 mg/dL (ref 0.3–1.2)

## 2013-03-10 LAB — LDL CHOLESTEROL, DIRECT: Direct LDL: 143 mg/dL

## 2013-03-10 LAB — BASIC METABOLIC PANEL
CO2: 23 mEq/L (ref 19–32)
Chloride: 106 mEq/L (ref 96–112)
Creatinine, Ser: 0.9 mg/dL (ref 0.4–1.2)
Potassium: 3.7 mEq/L (ref 3.5–5.1)

## 2013-03-10 LAB — TSH: TSH: 3.98 u[IU]/mL (ref 0.35–5.50)

## 2013-03-10 MED ORDER — CITALOPRAM HYDROBROMIDE 20 MG PO TABS: ORAL_TABLET | ORAL | Status: DC

## 2013-03-10 MED ORDER — ASPIRIN 81 MG PO TABS: ORAL_TABLET | ORAL | Status: DC

## 2013-03-10 NOTE — Patient Instructions (Addendum)
Use  Aveeno Daily  Moisturizing Lotion  twice a day  for itching skin. Bathe with moisturizing liquid soap , not bar soap.Claritin 10 mg during day if itching as needed; Zyrtec @ bedtime. Please take the probiotic , Align, every day until the bowels are normal. This will replace the normal bacteria which  are necessary for formation of normal stool and processing of food. Miralax every 3 rd day as needed for constipation. Reflux of gastric acid may be asymptomatic as this may occur mainly during sleep.The triggers for reflux  include stress; the "aspirin family" ; alcohol; peppermint; and caffeine (coffee, tea, cola, and chocolate). The aspirin family would include aspirin and the nonsteroidal agents such as ibuprofen &  Naproxen. Tylenol would not cause reflux. If having symptoms ; food & drink should be avoided for @ least 2 hours before going to bed.   Please take the ranitidine before breakfast and evening meal; if reflux symptoms persist a protein pump inhibitor such as omeprazole or Nexium should be considered.  Please decrease the aspirin to 81 mg coated baby aspirin .Take after breakfast.

## 2013-03-10 NOTE — Progress Notes (Signed)
  Subjective:    Patient ID: Pam Butler, female    DOB: 03-01-1946, 67 y.o.   MRN: [id number]  HPI   She was scheduled for a Medicare wellness exam but she has 3 concerns which are significant to her.  Her most important concern is irritable bowel symptoms which have been significant over the last year. She feels salads as well as  MSG are possible triggers for changes in her bowels. She alternates between constipation and loose to watery stool. With constipation she will have blood on the stool at times. Her colonoscopy was last performed 2009; she also had colonoscopy in 2004 for bowel symptoms.She has minor cramping. She thought ranitidine had been prescribed for irritable bowel  #2 she has acid reflux which has been a problem for 5 years. This is described as burning. Triggers include cola. She's been taking 325 mg of aspirin in the evening. This is associated with or "bad taste" in her mouth and slight nausea. She is only taking ranitidine once daily. She has not had an endoscopy.  #3 includes itching which has been a chronic problem since 1997. It would be typically around her neck. She does have minor associated hives occasionally. This exacerbated 2 weeks ago. The major trigger appears to be heat. This is essentially a stable problem that can be associated with diffuse skin itching. She's used Gold [place] and topical creams. She does take Zyrtec at bedtime    Review of Systems  She denies abdominal pain, unexplained weight loss, melena, rectal bleeding except with constipation.  She denies hoarseness, or reflux.  She has no significant extrinsic symptoms such as itchy, watery eyes, or sneezing. She has no angioedema.  She is due for followup of her fasting lipids. Her diet has improved in the last several weeks.  Her anxiety is well controlled with the generic Celexa 20 mg daily.     Objective:   Physical Exam General appearance is one of good health and nourishment w/o  distress.  Eyes: No conjunctival inflammation or scleral icterus is present.  Thyroid is normal to palpation. She has no masses around the neck or head.  Oral exam: Dental hygiene is good; lips and gums are healthy appearing.There is no oropharyngeal erythema or exudate noted.   Heart:  Normal rate and regular rhythm. S1 and S2 normal without gallop, murmur, click, rub or other extra sounds     Lungs:Chest clear to auscultation; no wheezes, rhonchi,rales ,or rubs present.No increased work of breathing.   Abdomen: bowel sounds normal, soft and non-tender without masses, organomegaly or hernias noted.  No guarding or rebound   Skin:Warm & dry.  Intact without suspicious lesions or rashes ; no jaundice or tenting  Lymphatic: No lymphadenopathy is noted about the head, neck, axilla  WU:[id number] curvature of upper thoracic  spine. She has no clubbing, cyanosis, or significant edema. Straight leg raising is negative            Assessment & Plan:  #1 interval bowel syndrome  #2 acid reflux  #3 pruritis  #4 dyslipidemia  #5 anxiety  Plan: See orders and recommendations.

## 2013-05-20 ENCOUNTER — Other Ambulatory Visit: Payer: Self-pay | Admitting: Internal Medicine

## 2013-06-23 ENCOUNTER — Other Ambulatory Visit: Payer: Self-pay | Admitting: Dermatology

## 2014-03-31 ENCOUNTER — Other Ambulatory Visit: Payer: Self-pay | Admitting: Internal Medicine

## 2014-03-31 NOTE — Telephone Encounter (Signed)
MD out of office, okay for refill?

## 2014-03-31 NOTE — Telephone Encounter (Signed)
Done erx 

## 2015-01-10 ENCOUNTER — Other Ambulatory Visit: Payer: Self-pay

## 2015-01-10 DIAGNOSIS — Z1231 Encounter for screening mammogram for malignant neoplasm of breast: Secondary | ICD-10-CM

## 2015-01-31 ENCOUNTER — Ambulatory Visit
Admission: RE | Admit: 2015-01-31 | Discharge: 2015-01-31 | Disposition: A | Discharge status: Home / Self Care | Payer: Medicare HMO | Source: Ambulatory Visit

## 2015-01-31 DIAGNOSIS — Z1231 Encounter for screening mammogram for malignant neoplasm of breast: Secondary | ICD-10-CM

## 2015-03-02 ENCOUNTER — Other Ambulatory Visit: Payer: Self-pay

## 2015-03-02 MED ORDER — RANITIDINE HCL 150 MG PO TABS: ORAL_TABLET | ORAL | Status: DC

## 2015-03-09 ENCOUNTER — Telehealth: Payer: Self-pay | Admitting: Internal Medicine

## 2015-03-09 MED ORDER — CITALOPRAM HYDROBROMIDE 40 MG PO TABS: 40.0000 mg | ORAL_TABLET | Freq: Every day | ORAL | Status: DC

## 2015-03-09 NOTE — Telephone Encounter (Signed)
Sent #15 only to [facility] with note...Pam Butler

## 2015-03-09 NOTE — Telephone Encounter (Signed)
#  15 Last seen 02/2013!!! Needs OV for refills

## 2015-03-09 NOTE — Telephone Encounter (Signed)
Last office visit may, 2014---please advise, thanks

## 2015-03-09 NOTE — Telephone Encounter (Signed)
Would like call back in regards to refill request on citalopram.  States sent refill request over and would like to know status.

## 2015-04-26 ENCOUNTER — Other Ambulatory Visit: Payer: Self-pay

## 2015-04-26 MED ORDER — CITALOPRAM HYDROBROMIDE 40 MG PO TABS: 40.0000 mg | ORAL_TABLET | Freq: Every day | ORAL | Status: DC

## 2015-04-26 NOTE — Telephone Encounter (Signed)
Rx refill request approved - Pt has appt 05/16/2015

## 2015-05-16 ENCOUNTER — Other Ambulatory Visit: Payer: Self-pay | Admitting: Internal Medicine

## 2015-05-16 ENCOUNTER — Encounter: Payer: Self-pay | Admitting: Internal Medicine

## 2015-05-16 ENCOUNTER — Ambulatory Visit (INDEPENDENT_AMBULATORY_CARE_PROVIDER_SITE_OTHER): Payer: Medicare HMO | Admitting: Internal Medicine

## 2015-05-16 ENCOUNTER — Other Ambulatory Visit (INDEPENDENT_AMBULATORY_CARE_PROVIDER_SITE_OTHER): Payer: Medicare HMO

## 2015-05-16 VITALS — HR 69 | Temp 97.8°F | Resp 14 | Ht 62.0 in | Wt 194.0 lb

## 2015-05-16 DIAGNOSIS — E785 Hyperlipidemia, unspecified: Secondary | ICD-10-CM | POA: Diagnosis not present

## 2015-05-16 DIAGNOSIS — F32A Depression, unspecified: Secondary | ICD-10-CM

## 2015-05-16 DIAGNOSIS — E782 Mixed hyperlipidemia: Secondary | ICD-10-CM

## 2015-05-16 DIAGNOSIS — F329 Major depressive disorder, single episode, unspecified: Secondary | ICD-10-CM

## 2015-05-16 DIAGNOSIS — K219 Gastro-esophageal reflux disease without esophagitis: Secondary | ICD-10-CM

## 2015-05-16 DIAGNOSIS — K589 Irritable bowel syndrome without diarrhea: Secondary | ICD-10-CM

## 2015-05-16 LAB — LIPID PANEL
CHOLESTEROL: 289 mg/dL — AB (ref 0–200)
HDL: 36.5 mg/dL — AB (ref >39.00)
Total CHOL/HDL Ratio: 8

## 2015-05-16 LAB — CBC WITH DIFFERENTIAL/PLATELET
Basophils Absolute: 0 10*3/uL (ref 0.0–0.1)
Basophils Relative: 0.8 % (ref 0.0–3.0)
EOS ABS: 0.1 10*3/uL (ref 0.0–0.7)
EOS PCT: 2.1 % (ref 0.0–5.0)
HCT: 37.4 % (ref 36.0–46.0)
Hemoglobin: 12.9 g/dL (ref 12.0–15.0)
LYMPHS ABS: 2.2 10*3/uL (ref 0.7–4.0)
Lymphocytes Relative: 37.2 % (ref 12.0–46.0)
MCHC: 34.4 g/dL (ref 30.0–36.0)
MCV: 88.1 fl (ref 78.0–100.0)
MONOS PCT: 10.2 % (ref 3.0–12.0)
Monocytes Absolute: 0.6 10*3/uL (ref 0.1–1.0)
Neutro Abs: 2.9 10*3/uL (ref 1.4–7.7)
Neutrophils Relative %: 49.7 % (ref 43.0–77.0)
Platelets: 252 10*3/uL (ref 150.0–400.0)
RBC: 4.24 Mil/uL (ref 3.87–5.11)
RDW: 12.9 % (ref 11.5–15.5)
WBC: 5.9 10*3/uL (ref 4.0–10.5)

## 2015-05-16 LAB — HEPATIC FUNCTION PANEL
ALBUMIN: 4.3 g/dL (ref 3.5–5.2)
ALT: 19 U/L (ref 0–35)
AST: 18 U/L (ref 0–37)
Alkaline Phosphatase: 61 U/L (ref 39–117)
BILIRUBIN TOTAL: 0.4 mg/dL (ref 0.2–1.2)
Bilirubin, Direct: 0 mg/dL (ref 0.0–0.3)
TOTAL PROTEIN: 7.3 g/dL (ref 6.0–8.3)

## 2015-05-16 LAB — BASIC METABOLIC PANEL
BUN: 18 mg/dL (ref 6–23)
CALCIUM: 9.6 mg/dL (ref 8.4–10.5)
CO2: 27 mEq/L (ref 19–32)
Chloride: 103 mEq/L (ref 96–112)
Creatinine, Ser: 0.87 mg/dL (ref 0.40–1.20)
GFR: 68.38 mL/min (ref >60.00)
GLUCOSE: 89 mg/dL (ref 70–99)
Potassium: 4.1 mEq/L (ref 3.5–5.1)
Sodium: 137 mEq/L (ref 135–145)

## 2015-05-16 LAB — TSH: TSH: 3.74 u[IU]/mL (ref 0.35–4.50)

## 2015-05-16 LAB — LDL CHOLESTEROL, DIRECT: Direct LDL: 118 mg/dL

## 2015-05-16 MED ORDER — CITALOPRAM HYDROBROMIDE 20 MG PO TABS: 20.0000 mg | ORAL_TABLET | Freq: Every day | ORAL | Status: DC

## 2015-05-16 NOTE — Assessment & Plan Note (Signed)
CBC & dif 

## 2015-05-16 NOTE — Assessment & Plan Note (Signed)
-

## 2015-05-16 NOTE — Assessment & Plan Note (Addendum)
Lipids, LFTs, TSH  

## 2015-05-16 NOTE — Progress Notes (Signed)
Pre visit review using our clinic review tool, if applicable. No additional management support is needed unless otherwise documented below in the visit note. 

## 2015-05-16 NOTE — Patient Instructions (Addendum)
To prevent sleep dysfunction follow these instructions for sleep hygiene. Do not read, watch TV, or eat in bed. Do not get into bed until you are ready to turn off the light &  to go to sleep. Do not ingest stimulants ( decongestants, diet pills, nicotine, caffeine) after the evening meal.Do not take daytime naps.Cardiovascular exercise, this can be as simple a program as walking, is recommended 30-45 minutes 3-4 times per week. If you're not exercising you should take 6-8 weeks to build up to this level.   Your next office appointment will be determined based upon review of your pending labs  and  xrays  Those written interpretation of the lab results and instructions will be transmitted to you by mail for your records.  Critical results will be called.   Followup as needed for any active or acute issue. Please report any significant change in your symptoms.

## 2015-05-16 NOTE — Progress Notes (Signed)
   Subjective:    Patient ID: Pam Butler, female    DOB: 07/20/1946, 69 y.o.   MRN: [id number]  HPI The patient is here to assess status of active health conditions.  PMH, FH, & Social History reviewed & updated.Change in [place] as recorded.  She does describe intermittent left neck pain. This is not present today.  She is on a modified heart healthy diet. She has used a treadmill very irregularly.  She will skip the Celexa occasionally. It is of value in controlling anxiety. She denies any panic attacks. She does have some slight depression. She denies any anorexia and she has no suicidal ideation.  She does have difficulty going to sleep at times.  When she strains she can have bright red rectal bleeding. She has very rare dysphagia, on average every 6 months.  She has occasional dysuria which she relates to vaginal drying  Review of Systems  Chest pain, palpitations, tachycardia, exertional dyspnea, paroxysmal nocturnal dyspnea, claudication or edema are absent. No unexplained weight loss, abdominal pain, significant dyspepsia, dysphagia, melena, or persistently small caliber stools. Pyuria, hematuria, frequency, nocturia or polyuria are denied. Change in hair, skin, nails denied. No bowel changes of constipation or diarrhea. No intolerance to heat or cold.     Objective:   Physical Exam  Pertinent or positive findings include: Bilateral ptosis present.Crepitus present in the knees with valgus changes.  General appearance :adequately nourished; in no distress. BMI: 35.47  Eyes: No conjunctival inflammation or scleral icterus is present.  Oral exam:  Lips and gums are healthy appearing.There is no oropharyngeal erythema or exudate noted. Dental hygiene is good.  Heart:  Normal rate and regular rhythm. S1 and S2 normal without gallop, murmur, click, rub or other extra sounds    Lungs:Chest clear to auscultation; no wheezes, rhonchi,rales ,or rubs present.No increased work of  breathing.   Abdomen: bowel sounds normal, soft and non-tender without masses, organomegaly or hernias noted.  No guarding or rebound.   Vascular : all pulses equal ; no bruits present.  Skin:Warm & dry.  Intact without suspicious lesions or rashes ; no tenting or jaundice   Lymphatic: No lymphadenopathy is noted about the head, neck, axilla  Neuro: Strength, tone & DTRs normal.        Assessment & Plan:  See Current Assessment & Plan in Problem List under specific Diagnosis

## 2015-05-16 NOTE — Assessment & Plan Note (Signed)
CBC and differential 

## 2015-05-29 ENCOUNTER — Telehealth: Payer: Self-pay

## 2015-05-29 NOTE — Telephone Encounter (Signed)
Call to the patient regarding AWV; Stated that Pam Butler had seen but not clear if he completed AWV;  Will call to discuss or schedule

## 2015-08-03 ENCOUNTER — Telehealth: Payer: Self-pay | Admitting: Internal Medicine

## 2015-08-03 NOTE — Telephone Encounter (Signed)
Dr. Havery Moros will you accept this pt? Please see note below.

## 2015-08-03 NOTE — Telephone Encounter (Signed)
Sure

## 2015-08-03 NOTE — Telephone Encounter (Signed)
If it's okay with Dr. Henrene Pastor it is okay with me

## 2015-08-03 NOTE — Telephone Encounter (Signed)
Per pt she would like to switch providers from Dr. Henrene Pastor to Dr. Havery Moros. She states her mother and sister will be seeing Dr. Havery Moros from now on. She would like to do the same.

## 2015-08-03 NOTE — Telephone Encounter (Signed)
Pt would like to switch care to Dr. Havery Moros. States her mother and sister will be seeing him and she would also like to. Dr. Henrene Pastor will you approve the switch? Please advise.

## 2015-08-06 NOTE — Telephone Encounter (Signed)
Please schedule pt with Dr. Havery Moros.

## 2015-08-06 NOTE — Telephone Encounter (Signed)
L/m a message for the pt. Stating Dr. Havery Moros accepted. She is due for colon Jan 2017 recall changed to Dr. Havery Moros.

## 2015-08-28 ENCOUNTER — Other Ambulatory Visit (INDEPENDENT_AMBULATORY_CARE_PROVIDER_SITE_OTHER): Payer: Medicare HMO

## 2015-08-28 DIAGNOSIS — E782 Mixed hyperlipidemia: Secondary | ICD-10-CM

## 2015-08-28 LAB — LIPID PANEL
Cholesterol: 256 mg/dL — ABNORMAL HIGH (ref 0–200)
HDL: 33.9 mg/dL — ABNORMAL LOW (ref >39.00)
NonHDL: 221.78
TRIGLYCERIDES: 323 mg/dL — AB (ref 0.0–149.0)
Total CHOL/HDL Ratio: 8
VLDL: 64.6 mg/dL — AB (ref 0.0–40.0)

## 2015-08-28 LAB — LDL CHOLESTEROL, DIRECT: LDL DIRECT: 157 mg/dL

## 2015-10-18 ENCOUNTER — Telehealth: Payer: Self-pay

## 2015-10-18 NOTE — Telephone Encounter (Signed)
Called and was unable to reach patient. Left message to give [place] a call back.

## 2015-11-01 DIAGNOSIS — L821 Other seborrheic keratosis: Secondary | ICD-10-CM | POA: Diagnosis not present

## 2015-11-01 DIAGNOSIS — D2261 Melanocytic nevi of right upper limb, including shoulder: Secondary | ICD-10-CM | POA: Diagnosis not present

## 2015-11-01 DIAGNOSIS — D225 Melanocytic nevi of trunk: Secondary | ICD-10-CM | POA: Diagnosis not present

## 2015-11-01 DIAGNOSIS — L910 Hypertrophic scar: Secondary | ICD-10-CM | POA: Diagnosis not present

## 2015-11-01 DIAGNOSIS — L920 Granuloma annulare: Secondary | ICD-10-CM | POA: Diagnosis not present

## 2015-11-01 DIAGNOSIS — D2262 Melanocytic nevi of left upper limb, including shoulder: Secondary | ICD-10-CM | POA: Diagnosis not present

## 2015-11-01 DIAGNOSIS — L814 Other melanin hyperpigmentation: Secondary | ICD-10-CM | POA: Diagnosis not present

## 2015-11-06 ENCOUNTER — Telehealth: Payer: Self-pay

## 2015-11-06 NOTE — Telephone Encounter (Signed)
Patient called to educate on Medicare Wellness apt. LVM for the patient to call back to educate and schedule for wellness visit.   

## 2015-11-09 ENCOUNTER — Encounter: Payer: Self-pay | Admitting: Gastroenterology

## 2015-11-29 ENCOUNTER — Encounter: Payer: Self-pay | Admitting: Gastroenterology

## 2015-12-11 DIAGNOSIS — H10413 Chronic giant papillary conjunctivitis, bilateral: Secondary | ICD-10-CM | POA: Diagnosis not present

## 2015-12-11 DIAGNOSIS — H04123 Dry eye syndrome of bilateral lacrimal glands: Secondary | ICD-10-CM | POA: Diagnosis not present

## 2015-12-14 DIAGNOSIS — H10413 Chronic giant papillary conjunctivitis, bilateral: Secondary | ICD-10-CM | POA: Diagnosis not present

## 2015-12-14 DIAGNOSIS — H04123 Dry eye syndrome of bilateral lacrimal glands: Secondary | ICD-10-CM | POA: Diagnosis not present

## 2016-01-15 ENCOUNTER — Ambulatory Visit (AMBULATORY_SURGERY_CENTER): Payer: Self-pay

## 2016-01-15 VITALS — Ht 61.5 in | Wt 180.0 lb

## 2016-01-15 DIAGNOSIS — Z1211 Encounter for screening for malignant neoplasm of colon: Secondary | ICD-10-CM

## 2016-01-15 MED ORDER — NA SULFATE-K SULFATE-MG SULF 17.5-3.13-1.6 GM/177ML PO SOLN: ORAL | Status: DC

## 2016-01-15 NOTE — Progress Notes (Signed)
Per pt, no allergies to soy or egg products.Pt not taking any weight loss meds or using  O2 at home. 

## 2016-01-29 ENCOUNTER — Encounter: Payer: Self-pay | Admitting: Gastroenterology

## 2016-01-29 ENCOUNTER — Ambulatory Visit (AMBULATORY_SURGERY_CENTER): Payer: Medicare HMO | Admitting: Gastroenterology

## 2016-01-29 VITALS — BP 125/73 | HR 59 | Temp 98.0°F | Resp 19 | Ht 61.5 in | Wt 180.0 lb

## 2016-01-29 DIAGNOSIS — K589 Irritable bowel syndrome without diarrhea: Secondary | ICD-10-CM | POA: Diagnosis not present

## 2016-01-29 DIAGNOSIS — D122 Benign neoplasm of ascending colon: Secondary | ICD-10-CM | POA: Diagnosis not present

## 2016-01-29 DIAGNOSIS — Z1211 Encounter for screening for malignant neoplasm of colon: Secondary | ICD-10-CM | POA: Diagnosis not present

## 2016-01-29 DIAGNOSIS — R69 Illness, unspecified: Secondary | ICD-10-CM | POA: Diagnosis not present

## 2016-01-29 DIAGNOSIS — K635 Polyp of colon: Secondary | ICD-10-CM | POA: Diagnosis not present

## 2016-01-29 MED ORDER — SODIUM CHLORIDE 0.9 % IV SOLN: 500.0000 mL | INTRAVENOUS | Status: DC

## 2016-01-29 NOTE — Progress Notes (Signed)
Stable to RR alert

## 2016-01-29 NOTE — Op Note (Signed)
[facility] Patient Name: Pam Butler Procedure Date: 01/29/2016 10:58 AM MRN: [id number] Endoscopist: Remo Lipps P. Reign Dziuba MD, MD Age: 70 Date of Birth: [birth year] Gender: Female Procedure:                Colonoscopy Indications:              Screening for colorectal malignant neoplasm Medicines:                Monitored Anesthesia Care Procedure:                Pre-Anesthesia Assessment:                           - Prior to the procedure, a History and Physical                            was performed, and patient medications and                            allergies were reviewed. The patient's tolerance of                            previous anesthesia was also reviewed. The risks                            and benefits of the procedure and the sedation                            options and risks were discussed with the patient.                            All questions were answered, and informed consent                            was obtained. Prior Anticoagulants: The patient has                            taken aspirin, last dose was 1 day prior to                            procedure. ASA Grade Assessment: II - A patient                            with mild systemic disease. After reviewing the                            risks and benefits, the patient was deemed in                            satisfactory condition to undergo the procedure.                           After obtaining informed consent, the colonoscope  was passed under direct vision. Throughout the                            procedure, the patient's blood pressure, pulse, and                            oxygen saturations were monitored continuously. The                            Model CF-HQ190L [id number]) scope was introduced                            through the anus and advanced to the the cecum,                            identified by appendiceal orifice and ileocecal                        valve. The colonoscopy was performed without                            difficulty. The patient tolerated the procedure                            well. The quality of the bowel preparation was                            adequate. The ileocecal valve, appendiceal orifice,                            and rectum were photographed. Scope In: 11:12:28 AM Scope Out: 11:34:38 AM Scope Withdrawal Time: 0 hours 19 minutes 3 seconds  Total Procedure Duration: 0 hours 22 minutes 10 seconds  Findings:                 The perianal and digital rectal examinations were                            normal.                           A 10 mm polyp was found in the ascending colon. The                            polyp was semi-pedunculated. The polyp was intended                            to be removed with hot snare however it was removed                            with a cold snare as cautery did not work                            correctly. Resection and retrieval were complete.  To prevent bleeding after the polypectomy, one                            hemostatic clip was successfully placed at the                            site. There was no bleeding at the end of the                            procedure.                           The exam was otherwise normal throughout the                            examined colon. Complications:            No immediate complications. Estimated blood loss:                            Minimal. Estimated Blood Loss:     Estimated blood loss was minimal. Impression:               - One 10 mm polyp in the ascending colon, removed                            with a cold snare. Resected and retrieved. Clip was                            placed prophylactically as outlined above. No                            bleeding appreciated. Recommendation:           - Patient has a contact number available for                             emergencies. The signs and symptoms of potential                            delayed complications were discussed with the                            patient. Return to normal activities tomorrow.                            Written discharge instructions were provided to the                            patient.                           - Resume previous diet.                           - Continue present medications.                           -  No aspirin, ibuprofen, naproxen, or other                            non-steroidal anti-inflammatory drugs for 2 weeks                            after polyp removal.                           - Await pathology results.                           - Repeat colonoscopy is recommended for                            surveillance. The colonoscopy date will be                            determined after pathology results from today's                            exam become available for review. Remo Lipps P. Onica Davidovich MD, MD 01/29/2016 11:42:13 AM This report has been signed electronically.

## 2016-01-29 NOTE — Patient Instructions (Signed)
YOU HAD AN ENDOSCOPIC PROCEDURE TODAY AT [facility]:   Refer to the procedure report that was given to you for any specific questions about what was found during the examination.  If the procedure report does not answer your questions, please call your gastroenterologist to clarify.  If you requested that your care partner not be given the details of your procedure findings, then the procedure report has been included in a sealed envelope for you to review at your convenience later.  YOU SHOULD EXPECT: Some feelings of bloating in the abdomen. Passage of more gas than usual.  Walking can help get rid of the air that was put into your GI tract during the procedure and reduce the bloating. If you had a lower endoscopy (such as a colonoscopy or flexible sigmoidoscopy) you may notice spotting of blood in your stool or on the toilet paper. If you underwent a bowel prep for your procedure, you may not have a normal bowel movement for a few days.  Please Note:  You might notice some irritation and congestion in your nose or some drainage.  This is from the oxygen used during your procedure.  There is no need for concern and it should clear up in a day or so.  SYMPTOMS TO REPORT IMMEDIATELY:   Following lower endoscopy (colonoscopy or flexible sigmoidoscopy):  Excessive amounts of blood in the stool  Significant tenderness or worsening of abdominal pains  Swelling of the abdomen that is new, acute  Fever of 100F or higher   For urgent or emergent issues, a gastroenterologist can be reached at any hour by calling [phone].   DIET: Your first meal following the procedure should be a small meal and then it is ok to progress to your normal diet. Heavy or fried foods are harder to digest and may make you feel nauseous or bloated.  Likewise, meals heavy in dairy and vegetables can increase bloating.  Drink plenty of fluids but you should avoid alcoholic beverages for 24  hours.  ACTIVITY:  You should plan to take it easy for the rest of today and you should NOT DRIVE or use heavy machinery until tomorrow (because of the sedation medicines used during the test).    FOLLOW UP: Our staff will call the number listed on your records the next business day following your procedure to check on you and address any questions or concerns that you may have regarding the information given to you following your procedure. If we do not reach you, we will leave a message.  However, if you are feeling well and you are not experiencing any problems, there is no need to return our call.  We will assume that you have returned to your regular daily activities without incident.  If any biopsies were taken you will be contacted by phone or by letter within the next 1-3 weeks.  Please call us at [phone] if you have not heard about the biopsies in 3 weeks.    SIGNATURES/CONFIDENTIALITY: You and/or your care partner have signed paperwork which will be entered into your electronic medical record.  These signatures attest to the fact that that the information above on your After Visit Summary has been reviewed and is understood.  Full responsibility of the confidentiality of this discharge information lies with you and/or your care-partner.  Polyp information given.   Pt. Given a card to identify clip and it's location.

## 2016-01-29 NOTE — Progress Notes (Signed)
Called to room to assist during endoscopic procedure.  Patient ID and intended procedure confirmed with present staff. Received instructions for my participation in the procedure from the performing physician.  

## 2016-01-29 NOTE — Progress Notes (Signed)
No egg or soy allergy known to patient  No issues with past sedation with any surgeries  or procedures, no intubation problems  No diet pills per patient No home 02 use per patient  No blood thinners per patient  Pt denies issues with constipation   

## 2016-01-30 ENCOUNTER — Telehealth: Payer: Self-pay

## 2016-01-30 NOTE — Telephone Encounter (Signed)
  Follow up Call-  Call back number 01/29/2016  Post procedure Call Back phone  # [phone]  Permission to leave phone message Yes     Patient questions:  Do you have a fever, pain , or abdominal swelling? No. Pain Score  0 *  Have you tolerated food without any problems? Yes.    Have you been able to return to your normal activities? Yes.    Do you have any questions about your discharge instructions: Diet   No. Medications  No. Follow up visit  No.  Do you have questions or concerns about your Care? No.  Actions: * If pain score is 4 or above: No action needed, pain <4.

## 2016-02-05 ENCOUNTER — Encounter: Payer: Self-pay | Admitting: Gastroenterology

## 2016-02-14 DIAGNOSIS — H524 Presbyopia: Secondary | ICD-10-CM | POA: Diagnosis not present

## 2016-02-14 DIAGNOSIS — H25813 Combined forms of age-related cataract, bilateral: Secondary | ICD-10-CM | POA: Diagnosis not present

## 2016-02-14 DIAGNOSIS — H04123 Dry eye syndrome of bilateral lacrimal glands: Secondary | ICD-10-CM | POA: Diagnosis not present

## 2016-03-11 DIAGNOSIS — R69 Illness, unspecified: Secondary | ICD-10-CM | POA: Diagnosis not present

## 2016-04-21 DIAGNOSIS — R69 Illness, unspecified: Secondary | ICD-10-CM | POA: Diagnosis not present

## 2016-05-27 ENCOUNTER — Other Ambulatory Visit: Payer: Self-pay | Admitting: Internal Medicine

## 2016-05-27 DIAGNOSIS — F329 Major depressive disorder, single episode, unspecified: Secondary | ICD-10-CM

## 2016-05-27 DIAGNOSIS — F32A Depression, unspecified: Secondary | ICD-10-CM

## 2016-07-15 DIAGNOSIS — R69 Illness, unspecified: Secondary | ICD-10-CM | POA: Diagnosis not present

## 2016-08-08 ENCOUNTER — Other Ambulatory Visit: Payer: Self-pay | Admitting: Internal Medicine

## 2016-08-08 DIAGNOSIS — F32A Depression, unspecified: Secondary | ICD-10-CM

## 2016-08-08 DIAGNOSIS — F329 Major depressive disorder, single episode, unspecified: Secondary | ICD-10-CM

## 2016-08-09 ENCOUNTER — Ambulatory Visit (INDEPENDENT_AMBULATORY_CARE_PROVIDER_SITE_OTHER): Payer: Medicare HMO | Admitting: Family Medicine

## 2016-08-09 VITALS — BP 122/72 | HR 74 | Temp 98.3°F | Resp 17 | Ht 62.0 in | Wt 165.0 lb

## 2016-08-09 DIAGNOSIS — E785 Hyperlipidemia, unspecified: Secondary | ICD-10-CM | POA: Diagnosis not present

## 2016-08-09 DIAGNOSIS — Z131 Encounter for screening for diabetes mellitus: Secondary | ICD-10-CM

## 2016-08-09 DIAGNOSIS — G4700 Insomnia, unspecified: Secondary | ICD-10-CM | POA: Diagnosis not present

## 2016-08-09 DIAGNOSIS — Z1329 Encounter for screening for other suspected endocrine disorder: Secondary | ICD-10-CM | POA: Diagnosis not present

## 2016-08-09 DIAGNOSIS — F411 Generalized anxiety disorder: Secondary | ICD-10-CM | POA: Diagnosis not present

## 2016-08-09 DIAGNOSIS — Z23 Encounter for immunization: Secondary | ICD-10-CM | POA: Diagnosis not present

## 2016-08-09 DIAGNOSIS — R5383 Other fatigue: Secondary | ICD-10-CM | POA: Diagnosis not present

## 2016-08-09 LAB — CBC WITH DIFFERENTIAL/PLATELET
BASOS PCT: 1 %
Basophils Absolute: 66 cells/uL (ref 0–200)
EOS ABS: 396 {cells}/uL (ref 15–500)
EOS PCT: 6 %
HCT: 38.2 % (ref 35.0–45.0)
HEMOGLOBIN: 13.1 g/dL (ref 11.7–15.5)
LYMPHS ABS: 2178 {cells}/uL (ref 850–3900)
Lymphocytes Relative: 33 %
MCH: 30 pg (ref 27.0–33.0)
MCHC: 34.3 g/dL (ref 32.0–36.0)
MCV: 87.6 fL (ref 80.0–100.0)
MONOS PCT: 9 %
MPV: 9.4 fL (ref 7.5–12.5)
Monocytes Absolute: 594 cells/uL (ref 200–950)
NEUTROS ABS: 3366 {cells}/uL (ref 1500–7800)
Neutrophils Relative %: 51 %
Platelets: 279 10*3/uL (ref 140–400)
RBC: 4.36 MIL/uL (ref 3.80–5.10)
RDW: 13 % (ref 11.0–15.0)
WBC: 6.6 10*3/uL (ref 3.8–10.8)

## 2016-08-09 LAB — COMPLETE METABOLIC PANEL WITH GFR
ALT: 20 U/L (ref 6–29)
AST: 20 U/L (ref 10–35)
Albumin: 4.6 g/dL (ref 3.6–5.1)
Alkaline Phosphatase: 50 U/L (ref 33–130)
BILIRUBIN TOTAL: 0.4 mg/dL (ref 0.2–1.2)
BUN: 25 mg/dL (ref 7–25)
CALCIUM: 10.5 mg/dL — AB (ref 8.6–10.4)
CHLORIDE: 106 mmol/L (ref 98–110)
CO2: 23 mmol/L (ref 20–31)
CREATININE: 0.92 mg/dL (ref 0.60–0.93)
GFR, EST AFRICAN AMERICAN: 72 mL/min (ref >=60)
GFR, Est Non African American: 63 mL/min (ref >=60)
Glucose, Bld: 88 mg/dL (ref 65–99)
Potassium: 4.6 mmol/L (ref 3.5–5.3)
Sodium: 138 mmol/L (ref 135–146)
TOTAL PROTEIN: 7.7 g/dL (ref 6.1–8.1)

## 2016-08-09 LAB — LIPID PANEL
CHOLESTEROL: 319 mg/dL — AB (ref 125–200)
HDL: 43 mg/dL — ABNORMAL LOW (ref >=46)
LDL CALC: 234 mg/dL — AB (ref <130)
TRIGLYCERIDES: 212 mg/dL — AB (ref <150)
Total CHOL/HDL Ratio: 7.4 Ratio — ABNORMAL HIGH (ref <=5.0)
VLDL: 42 mg/dL — ABNORMAL HIGH (ref <30)

## 2016-08-09 LAB — THYROID PANEL WITH TSH
FREE THYROXINE INDEX: 2.5 (ref 1.4–3.8)
T3 Uptake: 31 % (ref 22–35)
T4 TOTAL: 8.1 ug/dL (ref 4.5–12.0)
TSH: 5.11 m[IU]/L — AB

## 2016-08-09 MED ORDER — HYDROXYZINE HCL 25 MG PO TABS: 12.5000 mg | ORAL_TABLET | Freq: Three times a day (TID) | ORAL | 2 refills | Status: DC | PRN

## 2016-08-09 MED ORDER — CITALOPRAM HYDROBROMIDE 40 MG PO TABS: 40.0000 mg | ORAL_TABLET | Freq: Every day | ORAL | 2 refills | Status: DC

## 2016-08-09 NOTE — Patient Instructions (Addendum)
I have increased your citalopram from 20 mg to 40 mg once daily. I also prescribed Vistaril for sleep.  You will be notified of your lab results.  Nice to meet you!  Pam Butler. Pam Kingfisher, MSN, FNP-C [facility]     IF you received an x-ray today, you will receive an invoice from [facility]. Please contact [facility] at [phone] with questions or concerns regarding your invoice.   IF you received labwork today, you will receive an invoice from [employer]. Please contact [facility] at [phone] with questions or concerns regarding your invoice.   Our billing staff will not be able to assist you with questions regarding bills from these companies.  You will be contacted with the lab results as soon as they are available. The fastest way to get your results is to activate your My Chart account. Instructions are located on the last page of this paperwork. If you have not heard from [place] regarding the results in 2 weeks, please contact this office.

## 2016-08-09 NOTE — Progress Notes (Signed)
Patient ID: Pam Butler, female    DOB: [birth year], 70 y.o.   MRN: [id number]  PCP: Molli Barrows, FNP  Chief Complaint  Patient presents with  . Medication Refill    citalopram    Subjective:   HPI 70 year old female, presents for evaluation of anxiety and depression with a medication refill. Patient was previously followed at [facility] and reports she typically only comes in if she runs out of medication or has an acute illness. 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 She reports that she hasn't been sleeping very well which she feels is worsening her symptoms of depression. Her mother is ambulatory and alert/oriented but she reports that her mother can be verbally aggressive which at time causes her significant emotional distress. Her sisters helps take care of her mother occasionally, she reports is very helpful. Concerned that anxiety has been the worst recently than it has in years.   Social History   Social History  . Marital status: Married    Spouse name: N/A  . Number of children: N/A  . Years of education: N/A   Occupational History  . Not on file.   Social History Main Topics  . Smoking status: Never Smoker  . Smokeless tobacco: Never Used  . Alcohol use No  . Drug use: No  . Sexual activity: No   Other Topics Concern  . Not on file   Social History Narrative  . No narrative on file   Family History  Problem Relation Age of Onset  . Other Mother     Prediabetes  . Colitis Mother   . Pneumonia Father   . Heart attack Paternal Grandmother 65  . Heart attack Maternal Uncle 67  . Colon cancer Neg Hx     Review of Systems HPI  Patient Active Problem List   Diagnosis Date Noted  . EDEMA 07/19/2010  . ANXIETY STATE, UNSPECIFIED 03/08/2010  . IBS 03/08/2010  . Hyperlipidemia 05/05/2008  . DEPRESSION  05/05/2008  . GERD 05/05/2008     Prior to Admission medications   Medication Sig Start Date End Date Taking? Authorizing Provider  aspirin 81 MG tablet 1 post b'fast 03/10/13  Yes Hendricks Limes, MD  cetirizine (ZYRTEC) 10 MG tablet Take 10 mg by mouth daily.     Yes Historical Provider, MD  Cholecalciferol (D3-1000 PO) Take by mouth. Take 2 pills daily   Yes Historical Provider, MD  citalopram (CELEXA) 20 MG tablet Take 1 tablet (20 mg total) by mouth daily. 05/16/15  Yes Hendricks Limes, MD  Omega-3 Fatty Acids (OMEGA 3 PO) Take by mouth daily.   Yes Historical Provider, MD  ranitidine (ZANTAC) 150 MG tablet TAKE ONE TABLET BY MOUTH EVERY 12 HOURS BEFORE MEALS AS NEEDED. Patient not taking: Reported on 08/09/2016 03/02/15   Hendricks Limes, MD     Allergies  Allergen Reactions  . Penicillins     Hives  . Ciprofloxacin     itching  . Codeine     Itching(note: She has taken codeine containing cough preparations since)  . Latex     Causes swelling around face       Objective:  Physical Exam  Constitutional: She appears well-developed and well-nourished.  HENT:  Head: Normocephalic and atraumatic.  Right Ear: External ear normal.  Left Ear: External  ear normal.  Nose: Nose normal.  Mouth/Throat: Oropharynx is clear and moist.  Eyes: Conjunctivae and EOM are normal. Pupils are equal, round, and reactive to light.  Neck: Normal range of motion. Neck supple. Thyromegaly present.  Cardiovascular: Normal rate, regular rhythm, normal heart sounds and intact distal pulses.   Pulmonary/Chest: Effort normal and breath sounds normal.  Musculoskeletal: Normal range of motion.  Neurological: She is alert.  Skin: Skin is warm and dry.  Psychiatric: Her speech is normal and behavior is normal. Judgment and thought content normal. Her mood appears anxious. Cognition and memory are normal.    Vitals:   08/09/16 0833  BP: 122/72  Pulse: 74  Resp: 17  Temp: 98.3 F (36.8 C)       Assessment & Plan:  1. General anxiety disorder Plan: -Screen for thyroid disease -Increase citalopram from 20 mg daily to 40 mg daily   2. Screening for thyroid disorder - CBC with Differential/Platelet - Thyroid Panel With TSH  3. Screening for diabetes mellitus - COMPLETE METABOLIC PANEL WITH GFR  4. Hyperlipidemia, unspecified hyperlipidemia type Plan: - Lipid panel  5. Need for prophylactic vaccination and inoculation against influenza - Flu Vaccine QUAD 36+ mos IM  6. Insomnia  Plan: -Hydroxyzine (Vistaril) 12.5-25 mg every 8 hours as needed for anxiety and sleep  Follow-up as needed.  Carroll Sage. Kenton Kingfisher, MSN, FNP-C [facility]

## 2016-08-10 ENCOUNTER — Encounter: Payer: Self-pay | Admitting: Family Medicine

## 2016-08-10 MED ORDER — PRAVASTATIN SODIUM 40 MG PO TABS: 40.0000 mg | ORAL_TABLET | Freq: Every day | ORAL | 1 refills | Status: DC

## 2016-08-10 NOTE — Progress Notes (Signed)
August 10, 2016   [place] [address]   Dear Ms. Dimercurio,  Below are the results from your recent visit:  Evidence supports cardiovascular outcomes improve in those who follow the mediterranean and or DASH diet.  The Mediterranean diet emphasizes: Eating primarily plant-based foods, such as fruits and vegetables, whole grains, legumes and nuts. Replacing butter with healthy fats such as olive oil and canola oil. Using herbs and spices instead of salt to flavor foods  The DASH diet eating plan is a diet rich in fruits, vegetables, low fat or nonfat dairy. It also includes mostly whole grains; lean meats, fish and poultry; nuts and beans. It is high fiber and low to moderate in fat. It is a plan that follows US guidelines for sodium content, along with vitamins and minerals.   Resulted Orders  CBC with Differential/Platelet  Result Value Ref Range   WBC 6.6 3.8 - 10.8 K/uL   RBC 4.36 3.80 - 5.10 MIL/uL   Hemoglobin 13.1 11.7 - 15.5 g/dL   HCT 38.2 35.0 - 45.0 %   MCV 87.6 80.0 - 100.0 fL   MCH 30.0 27.0 - 33.0 pg   MCHC 34.3 32.0 - 36.0 g/dL   RDW 13.0 11.0 - 15.0 %   Platelets 279 140 - 400 K/uL   MPV 9.4 7.5 - 12.5 fL   Neutro Abs 3,366 1,500 - 7,800 cells/uL   Lymphs Abs 2,178 850 - 3,900 cells/uL   Monocytes Absolute 594 200 - 950 cells/uL   Eosinophils Absolute 396 15 - 500 cells/uL   Basophils Absolute 66 0 - 200 cells/uL   Neutrophils Relative % 51 %   Lymphocytes Relative 33 %   Monocytes Relative 9 %   Eosinophils Relative 6 %   Basophils Relative 1 %   Smear Review Criteria for review not met    Narrative   Performed at:  [facility]                [address], [address]                [place], [place] [address]  Thyroid Panel With TSH  Result Value Ref Range   T4, Total 8.1 4.5 - 12.0 ug/dL   T3 Uptake 31 22 - 35 %   Free Thyroxine Index 2.5 1.4 - 3.8   TSH 5.11 (H) mIU/L     Comment:       Reference Range   >  or = 20 Years  0.40-4.50   Pregnancy Range First trimester  0.26-2.66 Second trimester 0.55-2.73 Third trimester  0.43-2.91      Narrative   Performed at:  [address], [address]                [place],  [address]  Lipid panel  Result Value Ref Range   Cholesterol 319 (H) 125 - 200 mg/dL   Triglycerides 212 (H) <150 mg/dL   HDL 43 (L) >=46 mg/dL   Total CHOL/HDL Ratio 7.4 (H) <=5.0 Ratio   VLDL 42 (H) <30 mg/dL   LDL Cholesterol 234 (H) <130 mg/dL     Comment:       Total Cholesterol/HDL Ratio:CHD Risk                        Coronary  Heart Disease Risk Table                                        Men       Women          1/2 Average Risk              3.4        3.3              Average Risk              5.0        4.4           2X Average Risk              9.6        7.1           3X Average Risk             23.4       11.0 Use the calculated Patient Ratio above and the CHD Risk table  to determine the patient's CHD Risk.    Narrative   Performed at:  [facility]                [address], [address]                [place], [place] [address]  COMPLETE METABOLIC PANEL WITH GFR  Result Value Ref Range   Sodium 138 135 - 146 mmol/L   Potassium 4.6 3.5 - 5.3 mmol/L   Chloride 106 98 - 110 mmol/L   CO2 23 20 - 31 mmol/L   Glucose, Bld 88 65 - 99 mg/dL   BUN 25 7 - 25 mg/dL   Creat 0.92 0.60 - 0.93 mg/dL     Comment:       For patients > or = 71 years of age: The upper reference limit for Creatinine is approximately 13% higher for people identified as African-American.      Total Bilirubin 0.4 0.2 - 1.2 mg/dL   Alkaline Phosphatase 50 33 - 130 U/L   AST 20 10 - 35 U/L   ALT 20 6 - 29 U/L   Total Protein 7.7 6.1 - 8.1 g/dL   Albumin 4.6 3.6 - 5.1 g/dL   Calcium 10.5 (H) 8.6 - 10.4 mg/dL   GFR, Est African American 72 >=60 mL/min   GFR, Est Non African American 63 >=60 mL/min   Narrative   Performed at:   [address], [address]                [place], [place] [address]     If you have any questions or concerns, please don't hesitate to call.  Sincerely,   Molli Barrows, FNP

## 2016-09-11 DIAGNOSIS — H04123 Dry eye syndrome of bilateral lacrimal glands: Secondary | ICD-10-CM | POA: Diagnosis not present

## 2016-09-11 DIAGNOSIS — H25813 Combined forms of age-related cataract, bilateral: Secondary | ICD-10-CM | POA: Diagnosis not present

## 2016-09-11 DIAGNOSIS — H524 Presbyopia: Secondary | ICD-10-CM | POA: Diagnosis not present

## 2016-09-18 ENCOUNTER — Telehealth: Payer: Self-pay | Admitting: Internal Medicine

## 2016-09-18 NOTE — Telephone Encounter (Signed)
Patient states her spouse is a patient of Dr. Quay Burow and would like to know if Dr. Quay Burow would take her on?

## 2016-09-18 NOTE — Telephone Encounter (Signed)
Got patient schedule.  States Thank You!

## 2016-09-18 NOTE — Telephone Encounter (Signed)
ok 

## 2016-10-14 DIAGNOSIS — R69 Illness, unspecified: Secondary | ICD-10-CM | POA: Diagnosis not present

## 2016-10-21 DIAGNOSIS — R69 Illness, unspecified: Secondary | ICD-10-CM | POA: Diagnosis not present

## 2016-11-03 ENCOUNTER — Other Ambulatory Visit: Payer: Self-pay | Admitting: Internal Medicine

## 2016-11-03 DIAGNOSIS — Z1231 Encounter for screening mammogram for malignant neoplasm of breast: Secondary | ICD-10-CM

## 2016-11-04 ENCOUNTER — Ambulatory Visit
Admission: RE | Admit: 2016-11-04 | Discharge: 2016-11-04 | Disposition: A | Discharge status: Home / Self Care | Payer: Medicare HMO | Source: Ambulatory Visit | Attending: Internal Medicine | Admitting: Internal Medicine

## 2016-11-04 DIAGNOSIS — Z1231 Encounter for screening mammogram for malignant neoplasm of breast: Secondary | ICD-10-CM

## 2016-11-11 NOTE — Progress Notes (Signed)
Subjective:    Patient ID: Pam Butler, female    DOB: August 19, 1946, 71 y.o.   MRN: [id number]  HPI She is here to establish with a new pcp.    Anxiety, depression:  She is taking celexa daily.  She is taking care of her mother and that is causing a lot of stress.   When she takes 20 mg it is not quite enough, but 40 mg it too much.    Hyperlipidemia: she was on medication a long time ago, But not recently. She was trying to control her cholesterol with diet changes, but when she had recent blood work done her previous doctor prescribed pravastatin again. She states she did not take it. She would prefer to avoid medication if possible.Marland Kitchen   GERD:  She only has occasional GERD.  She takes over the counter medication as needed.  Prediabetes:  She is compliant with a low sugar/carbohydrate diet.  She is not exercising regularly.   Medications and allergies reviewed with patient and updated if appropriate.  Patient Active Problem List   Diagnosis Date Noted  . EDEMA 07/19/2010  . Anxiety 03/08/2010  . IBS 03/08/2010  . Hyperlipidemia 05/05/2008  . Depression 05/05/2008  . GERD 05/05/2008    Current Outpatient Prescriptions on File Prior to Visit  Medication Sig Dispense Refill  . cetirizine (ZYRTEC) 10 MG tablet Take 10 mg by mouth daily.      . Cholecalciferol (D3-1000 PO) Take by mouth. Take 2 pills daily    . citalopram (CELEXA) 40 MG tablet Take 1 tablet (40 mg total) by mouth daily. 90 tablet 2  . Omega-3 Fatty Acids (OMEGA 3 PO) Take by mouth daily.    Marland Kitchen aspirin 81 MG tablet 1 post b'fast (Patient not taking: Reported on 11/12/2016)    . hydrOXYzine (ATARAX/VISTARIL) 25 MG tablet Take 0.5-1 tablets (12.5-25 mg total) by mouth every 8 (eight) hours as needed for itching. (Patient not taking: Reported on 11/12/2016) 30 tablet 2  . pravastatin (PRAVACHOL) 40 MG tablet Take 1 tablet (40 mg total) by mouth daily. (Patient not taking: Reported on 11/12/2016) 90 tablet 1   No current  facility-administered medications on file prior to visit.     Past Medical History:  Diagnosis Date  . Allergy   . Anxiety   . Depression   . History of bloody stools    in past/    Past Surgical History:  Procedure Laterality Date  . ABDOMINAL HYSTERECTOMY     with bladder tack  . CHOLECYSTECTOMY    . TUBAL LIGATION      Social History   Social History  . Marital status: Married    Spouse name: N/A  . Number of children: N/A  . Years of education: N/A   Social History Main Topics  . Smoking status: Never Smoker  . Smokeless tobacco: Never Used  . Alcohol use No  . Drug use: No  . Sexual activity: No   Other Topics Concern  . Not on file   Social History Narrative  . No narrative on file    Family History  Problem Relation Age of Onset  . Other Mother     Prediabetes  . Colitis Mother   . Pneumonia Father   . Heart attack Paternal Grandmother 46  . Heart attack Maternal Uncle 67  . Colon cancer Neg Hx     Review of Systems  Constitutional: Negative for chills and fever.  Respiratory: Positive for cough.  Negative for shortness of breath and wheezing.   Cardiovascular: Positive for chest pain (occ tightness - thinks it is GERD - goes away with Tums) and leg swelling (left leg - chronic). Negative for palpitations.  Gastrointestinal: Positive for abdominal pain (IBS related).       Occ gerd  Neurological: Positive for headaches (occ). Negative for light-headedness.  Psychiatric/Behavioral: Positive for dysphoric mood. The patient is nervous/anxious.        Objective:   Vitals:   11/12/16 0919  BP: 124/72  Pulse: (!) 58  Resp: 16  Temp: 97.8 F (36.6 C)   Filed Weights   11/12/16 0919  Weight: 161 lb (73 kg)   Body mass index is 29.45 kg/m.  Wt Readings from Last 3 Encounters:  11/12/16 161 lb (73 kg)  08/09/16 165 lb (74.8 kg)  01/29/16 180 lb (81.6 kg)     Physical Exam Constitutional: Appears well-developed and well-nourished. No  distress.  HENT:  Head: Normocephalic and atraumatic.  Neck: Neck supple. No tracheal deviation present. No thyromegaly present.  No cervical lymphadenopathy Cardiovascular: Normal rate, regular rhythm and normal heart sounds.   No murmur heard. No carotid bruit .  No edema Pulmonary/Chest: Effort normal and breath sounds normal. No respiratory distress. No has no wheezes. No rales.  Abdomen: soft, non tender, non distended Skin: Skin is warm and dry. Not diaphoretic.  Psychiatric: Normal mood and affect. Behavior is normal.       Assessment & Plan:   See Problem List for Assessment and Plan of chronic medical problems.  FU in 6 months

## 2016-11-12 ENCOUNTER — Ambulatory Visit (INDEPENDENT_AMBULATORY_CARE_PROVIDER_SITE_OTHER): Payer: Medicare HMO | Admitting: Internal Medicine

## 2016-11-12 ENCOUNTER — Encounter: Payer: Self-pay | Admitting: Internal Medicine

## 2016-11-12 VITALS — BP 124/72 | HR 58 | Temp 97.8°F | Resp 16 | Ht 62.0 in | Wt 161.0 lb

## 2016-11-12 DIAGNOSIS — E7800 Pure hypercholesterolemia, unspecified: Secondary | ICD-10-CM | POA: Diagnosis not present

## 2016-11-12 DIAGNOSIS — Z23 Encounter for immunization: Secondary | ICD-10-CM

## 2016-11-12 DIAGNOSIS — K219 Gastro-esophageal reflux disease without esophagitis: Secondary | ICD-10-CM

## 2016-11-12 DIAGNOSIS — F32A Depression, unspecified: Secondary | ICD-10-CM

## 2016-11-12 DIAGNOSIS — Z0000 Encounter for general adult medical examination without abnormal findings: Secondary | ICD-10-CM

## 2016-11-12 DIAGNOSIS — F419 Anxiety disorder, unspecified: Secondary | ICD-10-CM | POA: Diagnosis not present

## 2016-11-12 DIAGNOSIS — R69 Illness, unspecified: Secondary | ICD-10-CM | POA: Diagnosis not present

## 2016-11-12 DIAGNOSIS — F329 Major depressive disorder, single episode, unspecified: Secondary | ICD-10-CM

## 2016-11-12 DIAGNOSIS — R7303 Prediabetes: Secondary | ICD-10-CM | POA: Insufficient documentation

## 2016-11-12 MED ORDER — CENTRUM SILVER 50+WOMEN PO TABS: 1.0000 | ORAL_TABLET | Freq: Every day | ORAL | Status: DC

## 2016-11-12 MED ORDER — CITALOPRAM HYDROBROMIDE 10 MG PO TABS
10.0000 mg | ORAL_TABLET | Freq: Every day | ORAL | 1 refills | Status: DC
Start: 2016-11-12 — End: 2016-11-12

## 2016-11-12 MED ORDER — CITALOPRAM HYDROBROMIDE 40 MG PO TABS: 20.0000 mg | ORAL_TABLET | Freq: Every day | ORAL | 2 refills | Status: DC

## 2016-11-12 MED ORDER — CITALOPRAM HYDROBROMIDE 10 MG PO TABS: 10.0000 mg | ORAL_TABLET | Freq: Every day | ORAL | 1 refills | Status: DC

## 2016-11-12 MED ORDER — LYSINE 500 MG PO TABS: ORAL_TABLET | ORAL | 0 refills | Status: DC

## 2016-11-12 NOTE — Assessment & Plan Note (Signed)
Check a1c Start regular exercise

## 2016-11-12 NOTE — Addendum Note (Signed)
Addended by: Terence Lux B on: 11/12/2016 02:53 PM   Modules accepted: Orders

## 2016-11-12 NOTE — Progress Notes (Signed)
Pre visit review using our clinic review tool, if applicable. No additional management support is needed unless otherwise documented below in the visit note. 

## 2016-11-12 NOTE — Patient Instructions (Addendum)
  Have blood work one week prior to your next appointment.   All other Health Maintenance issues reviewed.   All recommended immunizations and age-appropriate screenings are up-to-date or discussed.  prevnar pneumonia immunization administered today.   Medications reviewed and updated.  Changes include increasing the celexa to 30 mg daily.   Your prescription(s) have been submitted to your pharmacy. Please take as directed and contact our office if you believe you are having problem(s) with the medication(s).    Please followup in 6 months - for a physical

## 2016-11-12 NOTE — Assessment & Plan Note (Addendum)
Was on medication a long time ago- was prescribed pravastatin recently, but never took it Dr Linna Darner was trying to control it with diet LDL very high - but would like to avoid medication Start regular exercise, currently eating a ketogenic diet, which may increase cholesterol Will recheck lipid panel in 6 months and re-discuss starting a statin at that time

## 2016-11-12 NOTE — Assessment & Plan Note (Addendum)
Occasional GERD Taking medication as needed

## 2016-11-12 NOTE — Assessment & Plan Note (Signed)
Some depression Start celexa 30 mg daily - can adjust if needed

## 2016-11-12 NOTE — Assessment & Plan Note (Signed)
Taking celexa daily - 20 mg is not enough, but she thinks 40 mg it too much - currently she is taking an extra 1/2 of 20 mg as needed Try 30 mg daily  - can adjust if needed

## 2016-12-18 DIAGNOSIS — H524 Presbyopia: Secondary | ICD-10-CM | POA: Diagnosis not present

## 2016-12-18 DIAGNOSIS — H02832 Dermatochalasis of right lower eyelid: Secondary | ICD-10-CM | POA: Diagnosis not present

## 2016-12-18 DIAGNOSIS — H02831 Dermatochalasis of right upper eyelid: Secondary | ICD-10-CM | POA: Diagnosis not present

## 2016-12-18 DIAGNOSIS — H25813 Combined forms of age-related cataract, bilateral: Secondary | ICD-10-CM | POA: Diagnosis not present

## 2016-12-18 DIAGNOSIS — H04123 Dry eye syndrome of bilateral lacrimal glands: Secondary | ICD-10-CM | POA: Diagnosis not present

## 2017-01-06 ENCOUNTER — Ambulatory Visit (INDEPENDENT_AMBULATORY_CARE_PROVIDER_SITE_OTHER): Payer: Medicare HMO | Admitting: Internal Medicine

## 2017-01-06 ENCOUNTER — Encounter: Payer: Self-pay | Admitting: Internal Medicine

## 2017-01-06 VITALS — BP 140/78 | HR 83 | Temp 98.3°F | Resp 12 | Ht 62.0 in | Wt 160.0 lb

## 2017-01-06 DIAGNOSIS — R062 Wheezing: Secondary | ICD-10-CM | POA: Insufficient documentation

## 2017-01-06 DIAGNOSIS — R059 Cough, unspecified: Secondary | ICD-10-CM | POA: Insufficient documentation

## 2017-01-06 DIAGNOSIS — R7303 Prediabetes: Secondary | ICD-10-CM | POA: Diagnosis not present

## 2017-01-06 DIAGNOSIS — R05 Cough: Secondary | ICD-10-CM

## 2017-01-06 MED ORDER — ALBUTEROL SULFATE HFA 108 (90 BASE) MCG/ACT IN AERS: 2.0000 | INHALATION_SPRAY | Freq: Four times a day (QID) | RESPIRATORY_TRACT | 2 refills | Status: DC | PRN

## 2017-01-06 MED ORDER — DOXYCYCLINE HYCLATE 100 MG PO TABS: 100.0000 mg | ORAL_TABLET | Freq: Two times a day (BID) | ORAL | 0 refills | Status: DC

## 2017-01-06 MED ORDER — PREDNISONE 10 MG PO TABS: ORAL_TABLET | ORAL | 0 refills | Status: DC

## 2017-01-06 MED ORDER — HYDROCODONE-HOMATROPINE 5-1.5 MG/5ML PO SYRP: 5.0000 mL | ORAL_SOLUTION | Freq: Four times a day (QID) | ORAL | 0 refills | Status: AC | PRN

## 2017-01-06 MED ORDER — DOXYCYCLINE HYCLATE 100 MG PO TABS
100.0000 mg | ORAL_TABLET | Freq: Two times a day (BID) | ORAL | 0 refills | Status: DC
Start: 2017-01-06 — End: 2017-01-06

## 2017-01-06 NOTE — Assessment & Plan Note (Signed)
Mild, stable,  Lab Results  Component Value Date   HGBA1C 5.9 12/06/2010   Pt to call for worsening polys or cbg > 200 on tx

## 2017-01-06 NOTE — Progress Notes (Signed)
Pre visit review using our clinic review tool, if applicable. No additional management support is needed unless otherwise documented below in the visit note. 

## 2017-01-06 NOTE — Patient Instructions (Addendum)
Please take all new medication as prescribed - the antibiotic, cough medicine if needed, prednisone and Inhaler if needed  You can also take Mucinex (or it's generic off brand) for congestion, and tylenol as needed for pain.  Please continue all other medications as before, and refills have been done if requested.  Please have the pharmacy call with any other refills you may need.  Please keep your appointments with your specialists as you may have planned  Please go to the XRAY Department in the Basement (go straight as you get off the elevator) for the x-ray testing tomorrow

## 2017-01-06 NOTE — Assessment & Plan Note (Signed)
Mild to mod, c/w bronchitis vs pna,  for antibx course, cough med prn, for cxr in AM,  to f/u any worsening symptoms or concerns

## 2017-01-06 NOTE — Progress Notes (Signed)
   Subjective:    Patient ID: Pam Butler, female    DOB: 1945-12-06, 71 y.o.   MRN: [id number]  HPI  Here with acute onset mild to mod 2-3 days ST, HA, general weakness and malaise, with prod cough greenish sputum, but Pt denies chest pain, increased sob or doe, wheezing, orthopnea, PND, increased LE swelling, palpitations, dizziness or syncope, except for onset mild wheezing, sob since last PM.  Pt denies new neurological symptoms such as new headache, or facial or extremity weakness or numbness   Pt denies polydipsia, polyuria.pchx Current Outpatient Prescriptions on File Prior to Visit  Medication Sig Dispense Refill  . aspirin 81 MG tablet 1 post b'fast (Patient not taking: Reported on 11/12/2016)    . cetirizine (ZYRTEC) 10 MG tablet Take 10 mg by mouth daily.      . Cholecalciferol (D3-1000 PO) Take by mouth. Take 2 pills daily    . citalopram (CELEXA) 10 MG tablet Take 1 tablet (10 mg total) by mouth daily. For total of 30 mg daily 90 tablet 1  . citalopram (CELEXA) 40 MG tablet Take 0.5 tablets (20 mg total) by mouth daily. For total of 30 mg daily 90 tablet 2  . clindamycin (CLEOCIN) 150 MG capsule     . Lysine 500 MG TABS Takes one pill daily  0  . Multiple Vitamins-Minerals (CENTRUM SILVER 50+WOMEN) TABS Take 1 tablet by mouth daily.    . Omega-3 Fatty Acids (OMEGA 3 PO) Take by mouth daily.    . pravastatin (PRAVACHOL) 40 MG tablet Take 1 tablet (40 mg total) by mouth daily. (Patient not taking: Reported on 11/12/2016) 90 tablet 1   No current facility-administered medications on file prior to visit.    Review of Systems  Constitutional: Negative for unusual diaphoresis or night sweats HENT: Negative for ear swelling or discharge Eyes: Negative for worsening visual haziness  Respiratory: Negative for choking and stridor.   Gastrointestinal: Negative for distension or worsening eructation Genitourinary: Negative for retention or change in urine volume.  Musculoskeletal: Negative for  other MSK pain or swelling Skin: Negative for color change and worsening wound Neurological: Negative for tremors and numbness other than noted  Psychiatric/Behavioral: Negative for decreased concentration or agitation other than above   All other system neg per pt    Objective:   Physical Exam BP 140/78 (BP Location: Left Arm, Patient Position: Sitting, Cuff Size: Normal)   Pulse 83   Temp 98.3 F (36.8 C) (Oral)   Resp 12   Ht 5\' 2"  (1.575 m)   Wt 160 lb (72.6 kg)   SpO2 98%   BMI 29.26 kg/m  VS noted, mild ill Constitutional: Pt appears in no apparent distress HENT: Head: NCAT.  Right Ear: External ear normal.  Left Ear: External ear normal.  Eyes: . Pupils are equal, round, and reactive to light. Conjunctivae and EOM are normal Bilat tm's with mild erythema.  Max sinus areas non tender.  Pharynx with mild erythema, no exudate Neck: Normal range of motion. Neck supple. +LN Cardiovascular: Normal rate and regular rhythm.   Pulmonary/Chest: Effort normal and breath sounds decreased without rales but with few bilat wheezing.  Neurological: Pt is alert. Not confused , motor grossly intact Skin: Skin is warm. No rash, no LE edema Psychiatric: Pt behavior is normal. No agitation.  No other exam findings    Assessment & Plan:

## 2017-01-06 NOTE — Assessment & Plan Note (Signed)
Mild to mod, for predpac asd, inhaler prn, to f/u any worsening symptoms or concerns 

## 2017-01-07 ENCOUNTER — Ambulatory Visit (INDEPENDENT_AMBULATORY_CARE_PROVIDER_SITE_OTHER)
Admission: RE | Admit: 2017-01-07 | Discharge: 2017-01-07 | Disposition: A | Discharge status: Home / Self Care | Payer: Medicare HMO | Source: Ambulatory Visit | Attending: Internal Medicine | Admitting: Internal Medicine

## 2017-01-07 ENCOUNTER — Encounter: Payer: Self-pay | Admitting: Internal Medicine

## 2017-01-07 ENCOUNTER — Telehealth: Payer: Self-pay

## 2017-01-07 DIAGNOSIS — D2271 Melanocytic nevi of right lower limb, including hip: Secondary | ICD-10-CM | POA: Diagnosis not present

## 2017-01-07 DIAGNOSIS — R05 Cough: Secondary | ICD-10-CM

## 2017-01-07 DIAGNOSIS — R062 Wheezing: Secondary | ICD-10-CM

## 2017-01-07 DIAGNOSIS — L309 Dermatitis, unspecified: Secondary | ICD-10-CM | POA: Diagnosis not present

## 2017-01-07 DIAGNOSIS — R0602 Shortness of breath: Secondary | ICD-10-CM | POA: Diagnosis not present

## 2017-01-07 DIAGNOSIS — R079 Chest pain, unspecified: Secondary | ICD-10-CM | POA: Diagnosis not present

## 2017-01-07 DIAGNOSIS — L814 Other melanin hyperpigmentation: Secondary | ICD-10-CM | POA: Diagnosis not present

## 2017-01-07 DIAGNOSIS — R059 Cough, unspecified: Secondary | ICD-10-CM

## 2017-01-07 DIAGNOSIS — L821 Other seborrheic keratosis: Secondary | ICD-10-CM | POA: Diagnosis not present

## 2017-01-07 DIAGNOSIS — D2272 Melanocytic nevi of left lower limb, including hip: Secondary | ICD-10-CM | POA: Diagnosis not present

## 2017-01-07 DIAGNOSIS — L820 Inflamed seborrheic keratosis: Secondary | ICD-10-CM | POA: Diagnosis not present

## 2017-01-07 DIAGNOSIS — D225 Melanocytic nevi of trunk: Secondary | ICD-10-CM | POA: Diagnosis not present

## 2017-01-07 MED ORDER — ALBUTEROL SULFATE HFA 108 (90 BASE) MCG/ACT IN AERS: 1.0000 | INHALATION_SPRAY | Freq: Four times a day (QID) | RESPIRATORY_TRACT | 8 refills | Status: DC | PRN

## 2017-01-07 NOTE — Telephone Encounter (Signed)
Per pharmacy Proventil HFA is not covered. Would like an alternative of Ventolin HFA or Xopenex HFA.

## 2017-01-20 DIAGNOSIS — R69 Illness, unspecified: Secondary | ICD-10-CM | POA: Diagnosis not present

## 2017-01-27 DIAGNOSIS — J310 Chronic rhinitis: Secondary | ICD-10-CM | POA: Diagnosis not present

## 2017-01-27 DIAGNOSIS — H903 Sensorineural hearing loss, bilateral: Secondary | ICD-10-CM | POA: Diagnosis not present

## 2017-01-27 DIAGNOSIS — J343 Hypertrophy of nasal turbinates: Secondary | ICD-10-CM | POA: Diagnosis not present

## 2017-02-09 ENCOUNTER — Other Ambulatory Visit: Payer: Self-pay | Admitting: *Deleted

## 2017-02-09 MED ORDER — CETIRIZINE HCL 10 MG PO TABS: 10.0000 mg | ORAL_TABLET | Freq: Every day | ORAL | 1 refills | Status: DC

## 2017-05-01 DIAGNOSIS — H02835 Dermatochalasis of left lower eyelid: Secondary | ICD-10-CM | POA: Diagnosis not present

## 2017-05-01 DIAGNOSIS — H02831 Dermatochalasis of right upper eyelid: Secondary | ICD-10-CM | POA: Diagnosis not present

## 2017-05-01 DIAGNOSIS — H10413 Chronic giant papillary conjunctivitis, bilateral: Secondary | ICD-10-CM | POA: Diagnosis not present

## 2017-05-01 DIAGNOSIS — H02834 Dermatochalasis of left upper eyelid: Secondary | ICD-10-CM | POA: Diagnosis not present

## 2017-05-01 DIAGNOSIS — H02832 Dermatochalasis of right lower eyelid: Secondary | ICD-10-CM | POA: Diagnosis not present

## 2017-05-07 DIAGNOSIS — R69 Illness, unspecified: Secondary | ICD-10-CM | POA: Diagnosis not present

## 2017-05-09 ENCOUNTER — Other Ambulatory Visit: Payer: Self-pay | Admitting: Internal Medicine

## 2017-05-12 ENCOUNTER — Ambulatory Visit: Payer: Medicare HMO | Admitting: Internal Medicine

## 2017-05-18 NOTE — Patient Instructions (Addendum)
  Test(s) ordered today. Your results will be released to [facility] (or called to you) after review, usually within 72hours after test completion. If any changes need to be made, you will be notified at that same time.  All other Health Maintenance issues reviewed.   All recommended immunizations and age-appropriate screenings are up-to-date or discussed.  No immunizations administered today.   Medications reviewed and updated.  No changes recommended at this time.   Please followup in 6 months for a physical

## 2017-05-18 NOTE — Progress Notes (Signed)
Subjective:    Patient ID: Pam Butler, female    DOB: 06-09-1946, 71 y.o.   MRN: [id number]  HPI The patient is here for follow up.  Anxiety, depression: She has her mom living with her and it causes a lot of stress.  She is taking celexa 30 mg daily as prescribed. She denies any side effects from the medication. She feels her anxiety and depression are fairly controlled, but she has good days and bad days.   Prediabetes:  She is not compliant with a low sugar/carbohydrate diet.  She is not exercising regularly.  Hyperlipidemia: She is not taking any medication at this time.  She is trying to control her cholesterol diet lifestyle.  She was prescribed pravastatin in the recent past, but did not take it. She is not compliant with a low fat/cholesterol diet. She is not exercising regularly.       Medications and allergies reviewed with patient and updated if appropriate.  Patient Active Problem List   Diagnosis Date Noted  . Cough 01/06/2017  . Wheezing 01/06/2017  . Prediabetes 11/12/2016  . Anxiety 03/08/2010  . IBS 03/08/2010  . Hyperlipidemia 05/05/2008  . Depression 05/05/2008  . GERD 05/05/2008    Current Outpatient Prescriptions on File Prior to Visit  Medication Sig Dispense Refill  . albuterol (VENTOLIN HFA) 108 (90 Base) MCG/ACT inhaler Inhale 1-2 puffs into the lungs every 6 (six) hours as needed for wheezing or shortness of breath. 1 Inhaler 8  . aspirin 81 MG tablet 1 post b'fast    . cetirizine (ZYRTEC) 10 MG tablet Take 1 tablet (10 mg total) by mouth daily. 90 tablet 1  . Cholecalciferol (D3-1000 PO) Take by mouth. Take 2 pills daily    . citalopram (CELEXA) 10 MG tablet TAKE 1 TABLET EVERY DAY 90 tablet 0  . citalopram (CELEXA) 40 MG tablet Take 0.5 tablets (20 mg total) by mouth daily. For total of 30 mg daily 90 tablet 2  . clindamycin (CLEOCIN) 150 MG capsule     . Lysine 500 MG TABS Takes one pill daily  0  . Multiple Vitamins-Minerals (CENTRUM SILVER  50+WOMEN) TABS Take 1 tablet by mouth daily.    . Omega-3 Fatty Acids (OMEGA 3 PO) Take by mouth daily.    . pravastatin (PRAVACHOL) 40 MG tablet Take 1 tablet (40 mg total) by mouth daily. 90 tablet 1   No current facility-administered medications on file prior to visit.     Past Medical History:  Diagnosis Date  . Allergy   . Anxiety   . Depression   . History of bloody stools    in past/    Past Surgical History:  Procedure Laterality Date  . ABDOMINAL HYSTERECTOMY     with bladder tack  . CHOLECYSTECTOMY    . TUBAL LIGATION      Social History   Social History  . Marital status: Married    Spouse name: N/A  . Number of children: N/A  . Years of education: N/A   Social History Main Topics  . Smoking status: Never Smoker  . Smokeless tobacco: Never Used  . Alcohol use No  . Drug use: No  . Sexual activity: No   Other Topics Concern  . Not on file   Social History Narrative   Exercise: none    Family History  Problem Relation Age of Onset  . Other Mother        Prediabetes  . Colitis Mother   .  Pneumonia Father   . Heart attack Paternal Grandmother 80  . Heart attack Maternal Uncle 67  . Colon cancer Neg Hx     Review of Systems  Constitutional: Negative for chills and fever.  Respiratory: Negative for cough, shortness of breath and wheezing.   Cardiovascular: Positive for leg swelling (mild, chronic and unchanged). Negative for chest pain and palpitations.  Neurological: Positive for headaches. Negative for light-headedness.       Objective:   Vitals:   05/19/17 1308  BP: 128/76  Pulse: 73  Resp: 16  Temp: 97.9 F (36.6 C)   Wt Readings from Last 3 Encounters:  05/19/17 169 lb (76.7 kg)  01/06/17 160 lb (72.6 kg)  11/12/16 161 lb (73 kg)   Body mass index is 30.91 kg/m.   Physical Exam    Constitutional: Appears well-developed and well-nourished. No distress.  HENT:  Head: Normocephalic and atraumatic.  Neck: Neck supple. No  tracheal deviation present. No thyromegaly present.  No cervical lymphadenopathy Cardiovascular: Normal rate, regular rhythm and normal heart sounds.   No murmur heard. No carotid bruit .  Trace b/l LE edema Pulmonary/Chest: Effort normal and breath sounds normal. No respiratory distress. No has no wheezes. No rales.  Skin: Skin is warm and dry. Not diaphoretic.  Psychiatric: Normal mood and affect. Behavior is normal.      Assessment & Plan:    See Problem List for Assessment and Plan of chronic medical problems.

## 2017-05-19 ENCOUNTER — Other Ambulatory Visit (INDEPENDENT_AMBULATORY_CARE_PROVIDER_SITE_OTHER): Payer: Medicare HMO

## 2017-05-19 ENCOUNTER — Ambulatory Visit (INDEPENDENT_AMBULATORY_CARE_PROVIDER_SITE_OTHER): Payer: Medicare HMO | Admitting: Internal Medicine

## 2017-05-19 ENCOUNTER — Encounter: Payer: Self-pay | Admitting: Internal Medicine

## 2017-05-19 VITALS — BP 128/76 | HR 73 | Temp 97.9°F | Resp 16 | Wt 169.0 lb

## 2017-05-19 DIAGNOSIS — Z1159 Encounter for screening for other viral diseases: Secondary | ICD-10-CM | POA: Diagnosis not present

## 2017-05-19 DIAGNOSIS — R7303 Prediabetes: Secondary | ICD-10-CM | POA: Diagnosis not present

## 2017-05-19 DIAGNOSIS — E7800 Pure hypercholesterolemia, unspecified: Secondary | ICD-10-CM | POA: Diagnosis not present

## 2017-05-19 DIAGNOSIS — F419 Anxiety disorder, unspecified: Secondary | ICD-10-CM

## 2017-05-19 DIAGNOSIS — F329 Major depressive disorder, single episode, unspecified: Secondary | ICD-10-CM | POA: Diagnosis not present

## 2017-05-19 DIAGNOSIS — F32A Depression, unspecified: Secondary | ICD-10-CM

## 2017-05-19 DIAGNOSIS — R69 Illness, unspecified: Secondary | ICD-10-CM | POA: Diagnosis not present

## 2017-05-19 LAB — COMPREHENSIVE METABOLIC PANEL
ALBUMIN: 4.4 g/dL (ref 3.5–5.2)
ALT: 23 U/L (ref 0–35)
AST: 20 U/L (ref 0–37)
Alkaline Phosphatase: 46 U/L (ref 39–117)
BILIRUBIN TOTAL: 0.4 mg/dL (ref 0.2–1.2)
BUN: 24 mg/dL — AB (ref 6–23)
CALCIUM: 9.4 mg/dL (ref 8.4–10.5)
CO2: 24 mEq/L (ref 19–32)
CREATININE: 0.82 mg/dL (ref 0.40–1.20)
Chloride: 106 mEq/L (ref 96–112)
GFR: 72.8 mL/min (ref >60.00)
Glucose, Bld: 92 mg/dL (ref 70–99)
Potassium: 3.7 mEq/L (ref 3.5–5.1)
Sodium: 138 mEq/L (ref 135–145)
TOTAL PROTEIN: 7.4 g/dL (ref 6.0–8.3)

## 2017-05-19 LAB — LIPID PANEL
CHOLESTEROL: 299 mg/dL — AB (ref 0–200)
HDL: 45.6 mg/dL (ref >39.00)
NonHDL: 253.16
TRIGLYCERIDES: 292 mg/dL — AB (ref 0.0–149.0)
Total CHOL/HDL Ratio: 7
VLDL: 58.4 mg/dL — ABNORMAL HIGH (ref 0.0–40.0)

## 2017-05-19 LAB — HEMOGLOBIN A1C: Hgb A1c MFr Bld: 5.6 % (ref 4.6–6.5)

## 2017-05-19 LAB — LDL CHOLESTEROL, DIRECT: LDL DIRECT: 191 mg/dL

## 2017-05-19 LAB — TSH: TSH: 2.76 u[IU]/mL (ref 0.35–4.50)

## 2017-05-19 NOTE — Assessment & Plan Note (Signed)
Check a1c Low sugar / carb diet Stressed regular exercise   

## 2017-05-19 NOTE — Assessment & Plan Note (Signed)
Check lipid panel  Continue daily statin Regular exercise and healthy diet encouraged  

## 2017-05-19 NOTE — Assessment & Plan Note (Signed)
Still has a lot of stress/anxiety/depression - she cares for her mom Good days, bad days Overall controlled Continue celexa 30 mg daily

## 2017-05-20 LAB — HEPATITIS C ANTIBODY: HCV AB: NONREACTIVE

## 2017-05-23 ENCOUNTER — Other Ambulatory Visit: Payer: Self-pay | Admitting: Internal Medicine

## 2017-05-23 MED ORDER — PRAVASTATIN SODIUM 40 MG PO TABS: 40.0000 mg | ORAL_TABLET | Freq: Every day | ORAL | 1 refills | Status: DC

## 2017-06-04 DIAGNOSIS — R69 Illness, unspecified: Secondary | ICD-10-CM | POA: Diagnosis not present

## 2017-07-02 DIAGNOSIS — H04123 Dry eye syndrome of bilateral lacrimal glands: Secondary | ICD-10-CM | POA: Diagnosis not present

## 2017-07-02 DIAGNOSIS — H524 Presbyopia: Secondary | ICD-10-CM | POA: Diagnosis not present

## 2017-07-02 DIAGNOSIS — H25813 Combined forms of age-related cataract, bilateral: Secondary | ICD-10-CM | POA: Diagnosis not present

## 2017-08-12 ENCOUNTER — Other Ambulatory Visit: Payer: Self-pay | Admitting: Internal Medicine

## 2017-11-06 DIAGNOSIS — Z8249 Family history of ischemic heart disease and other diseases of the circulatory system: Secondary | ICD-10-CM | POA: Diagnosis not present

## 2017-11-06 DIAGNOSIS — F419 Anxiety disorder, unspecified: Secondary | ICD-10-CM | POA: Diagnosis not present

## 2017-11-06 DIAGNOSIS — R32 Unspecified urinary incontinence: Secondary | ICD-10-CM | POA: Diagnosis not present

## 2017-11-06 DIAGNOSIS — R69 Illness, unspecified: Secondary | ICD-10-CM | POA: Diagnosis not present

## 2017-11-06 DIAGNOSIS — K5900 Constipation, unspecified: Secondary | ICD-10-CM | POA: Diagnosis not present

## 2017-11-06 DIAGNOSIS — L299 Pruritus, unspecified: Secondary | ICD-10-CM | POA: Diagnosis not present

## 2017-11-07 ENCOUNTER — Other Ambulatory Visit: Payer: Self-pay | Admitting: Internal Medicine

## 2017-11-12 ENCOUNTER — Other Ambulatory Visit: Payer: Self-pay | Admitting: Internal Medicine

## 2017-11-12 DIAGNOSIS — Z1231 Encounter for screening mammogram for malignant neoplasm of breast: Secondary | ICD-10-CM

## 2017-11-13 DIAGNOSIS — H10413 Chronic giant papillary conjunctivitis, bilateral: Secondary | ICD-10-CM | POA: Diagnosis not present

## 2017-11-13 DIAGNOSIS — H04123 Dry eye syndrome of bilateral lacrimal glands: Secondary | ICD-10-CM | POA: Diagnosis not present

## 2017-11-16 ENCOUNTER — Ambulatory Visit (INDEPENDENT_AMBULATORY_CARE_PROVIDER_SITE_OTHER): Payer: Medicare HMO | Admitting: Otolaryngology

## 2017-11-16 DIAGNOSIS — J310 Chronic rhinitis: Secondary | ICD-10-CM | POA: Diagnosis not present

## 2017-11-16 DIAGNOSIS — J343 Hypertrophy of nasal turbinates: Secondary | ICD-10-CM

## 2017-11-18 ENCOUNTER — Encounter: Payer: Medicare HMO | Admitting: Internal Medicine

## 2017-11-19 ENCOUNTER — Other Ambulatory Visit: Payer: Self-pay | Admitting: Internal Medicine

## 2017-12-02 ENCOUNTER — Ambulatory Visit
Admission: RE | Admit: 2017-12-02 | Discharge: 2017-12-02 | Disposition: A | Discharge status: Home / Self Care | Payer: Medicare HMO | Source: Ambulatory Visit | Attending: Internal Medicine | Admitting: Internal Medicine

## 2017-12-02 DIAGNOSIS — Z1231 Encounter for screening mammogram for malignant neoplasm of breast: Secondary | ICD-10-CM

## 2018-01-07 DIAGNOSIS — H25813 Combined forms of age-related cataract, bilateral: Secondary | ICD-10-CM | POA: Diagnosis not present

## 2018-01-07 DIAGNOSIS — H524 Presbyopia: Secondary | ICD-10-CM | POA: Diagnosis not present

## 2018-01-07 DIAGNOSIS — H10413 Chronic giant papillary conjunctivitis, bilateral: Secondary | ICD-10-CM | POA: Diagnosis not present

## 2018-01-07 DIAGNOSIS — H04123 Dry eye syndrome of bilateral lacrimal glands: Secondary | ICD-10-CM | POA: Diagnosis not present

## 2018-01-08 NOTE — Progress Notes (Signed)
Subjective:    Patient ID: Pam Butler, female    DOB: Sep 19, 1946, 72 y.o.   MRN: [id number]  HPI She is here for a physical exam.   Chest pain: Two morning ago she was standing and had nausea and then had pain in her left chest and it went through to the back.  The pain improved during the day but persisted.  It was worse with increased movement or reaching with her left arm.  At rest she had no pain.  It was a 4/10.  Today she feels ok - there is still a dull ache, but it is mild.  If she breaths or takes a deep breath it hurts.  She denies any increased GERD, but she does get heartburn on occasion.  Left leg has always been a little larger than the right leg:  She thinks the left leg is a little larger than usual.  She elevates her legs when sitting.  She denies left leg pain.    Medications and allergies reviewed with patient and updated if appropriate.  Patient Active Problem List   Diagnosis Date Noted  . Chest pain at rest 01/11/2018  . Prediabetes 11/12/2016  . Anxiety 03/08/2010  . IBS 03/08/2010  . Hyperlipidemia 05/05/2008  . Depression 05/05/2008  . GERD 05/05/2008    Current Outpatient Medications on File Prior to Visit  Medication Sig Dispense Refill  . aspirin 81 MG tablet 1 post b'fast    . cetirizine (ZYRTEC) 10 MG tablet TAKE 1 TABLET BY MOUTH EVERY DAY 90 tablet 1  . citalopram (CELEXA) 40 MG tablet Take 0.5 tablets (20 mg total) by mouth daily. For total of 30 mg daily 90 tablet 2  . Lysine 500 MG TABS Takes one pill daily  0  . Cholecalciferol (D3-1000 PO) Take by mouth. Take 2 pills daily    . Multiple Vitamins-Minerals (CENTRUM SILVER 50+WOMEN) TABS Take 1 tablet by mouth daily. (Patient not taking: Reported on 01/11/2018)    . Omega-3 Fatty Acids (OMEGA 3 PO) Take by mouth daily.     No current facility-administered medications on file prior to visit.     Past Medical History:  Diagnosis Date  . Allergy   . Anxiety   . Depression   . History of bloody  stools    in past/    Past Surgical History:  Procedure Laterality Date  . ABDOMINAL HYSTERECTOMY     with bladder tack  . BREAST BIOPSY Right 2001  . CHOLECYSTECTOMY    . TUBAL LIGATION      Social History   Socioeconomic History  . Marital status: Married    Spouse name: Not on file  . Number of children: Not on file  . Years of education: Not on file  . Highest education level: Not on file  Occupational History  . Not on file  Social Needs  . Financial resource strain: Not on file  . Food insecurity:    Worry: Not on file    Inability: Not on file  . Transportation needs:    Medical: Not on file    Non-medical: Not on file  Tobacco Use  . Smoking status: Never Smoker  . Smokeless tobacco: Never Used  Substance and Sexual Activity  . Alcohol use: No    Alcohol/week: 0.0 oz  . Drug use: No  . Sexual activity: Never  Lifestyle  . Physical activity:    Days per week: Not on file  Minutes per session: Not on file  . Stress: Not on file  Relationships  . Social connections:    Talks on phone: Not on file    Gets together: Not on file    Attends religious service: Not on file    Active member of club or organization: Not on file    Attends meetings of clubs or organizations: Not on file    Relationship status: Not on file  Other Topics Concern  . Not on file  Social History Narrative   Exercise: none    Family History  Problem Relation Age of Onset  . Other Mother        Prediabetes  . Colitis Mother   . Pneumonia Father   . Heart attack Paternal Grandmother 76  . Heart attack Maternal Uncle 67  . Colon cancer Neg Hx     Review of Systems  Constitutional: Negative for chills and fever.  Eyes: Negative for visual disturbance.  Respiratory: Negative for cough, shortness of breath and wheezing.   Cardiovascular: Positive for chest pain and leg swelling. Negative for palpitations.  Gastrointestinal: Positive for anal bleeding (occ from hemorrhoids)  and nausea (recently only). Negative for abdominal pain, blood in stool, constipation and diarrhea.       Occ GERD  Genitourinary: Negative for dysuria and hematuria.  Musculoskeletal: Negative for arthralgias and back pain.  Skin: Negative for color change and rash.  Neurological: Negative for light-headedness and headaches.  Psychiatric/Behavioral: Positive for dysphoric mood. The patient is nervous/anxious.        Objective:   Vitals:   01/11/18 0842  BP: 120/66  Pulse: 76  Resp: 16  Temp: 97.7 F (36.5 C)  SpO2: 99%   Filed Weights   01/11/18 0842  Weight: 184 lb (83.5 kg)   Body mass index is 33.65 kg/m.  Wt Readings from Last 3 Encounters:  01/11/18 184 lb (83.5 kg)  05/19/17 169 lb (76.7 kg)  01/06/17 160 lb (72.6 kg)     Physical Exam Constitutional: She appears well-developed and well-nourished. No distress.  HENT:  Head: Normocephalic and atraumatic.  Right Ear: External ear normal. Normal ear canal and TM Left Ear: External ear normal.  Normal ear canal and TM Mouth/Throat: Oropharynx is clear and moist.  Eyes: Conjunctivae and EOM are normal.  Neck: Neck supple. No tracheal deviation present. No thyromegaly present.  No carotid bruit  Cardiovascular: Normal rate, regular rhythm and normal heart sounds.   No murmur heard.  Minimal edema bilateral lower extremities-left lower leg is larger than right lower leg, no pitting; visible varicose veins right lower extremity, no visible veins or varicose veins in left lower extremity Pulmonary/Chest: Effort normal and breath sounds normal. No respiratory distress. She has no wheezes. She has no rales.  Breast: deferred Abdominal: Soft. She exhibits no distension. There is no tenderness.  Lymphadenopathy: She has no cervical adenopathy.  Skin: Skin is warm and dry. She is not diaphoretic.  Psychiatric: She has a normal mood and affect. Her behavior is normal.        Assessment & Plan:   Physical  exam: Screening blood work  ordered Immunizations   Discussed shingrix, pneumovax due, td due Colonoscopy    Up to date  Mammogram   Up to date  23 - no longer seeing - may go back Dexa  Done 2012 -  Normal - ordered Eye exams  Up to date  EKG   Last done 2011-EKG done today Exercise  none Weight   Advised weight loss Skin no concerns Substance abuse   none  See Problem List for Assessment and Plan of chronic medical problems.

## 2018-01-08 NOTE — Patient Instructions (Addendum)
Test(s) ordered today. Your results will be released to [facility] (or called to you) after review, usually within 72hours after test completion. If any changes need to be made, you will be notified at that same time.  All other Health Maintenance issues reviewed.   All recommended immunizations and age-appropriate screenings are up-to-date or discussed.  Pneumovax immunization administered today.   Medications reviewed and updated.  No changes recommended at this time.  Your prescription(s) have been submitted to your pharmacy. Please take as directed and contact our office if you believe you are having problem(s) with the medication(s).  A bone density scan was ordered.  An EKG was done today.    Please followup in 6 months   Health Maintenance, Female Adopting a healthy lifestyle and getting preventive care can go a long way to promote health and wellness. Talk with your health care provider about what schedule of regular examinations is right for you. This is a good chance for you to check in with your provider about disease prevention and staying healthy. In between checkups, there are plenty of things you can do on your own. Experts have done a lot of research about which lifestyle changes and preventive measures are most likely to keep you healthy. Ask your health care provider for more information. Weight and diet Eat a healthy diet  Be sure to include plenty of vegetables, fruits, low-fat dairy products, and lean protein.  Do not eat a lot of foods high in solid fats, added sugars, or salt.  Get regular exercise. This is one of the most important things you can do for your health. ? Most adults should exercise for at least 150 minutes each week. The exercise should increase your heart rate and make you sweat (moderate-intensity exercise). ? Most adults should also do strengthening exercises at least twice a week. This is in addition to the moderate-intensity exercise.  Maintain  a healthy weight  Body mass index (BMI) is a measurement that can be used to identify possible weight problems. It estimates body fat based on height and weight. Your health care provider can help determine your BMI and help you achieve or maintain a healthy weight.  For females 53 years of age and older: ? A BMI below 18.5 is considered underweight. ? A BMI of 18.5 to 24.9 is normal. ? A BMI of 25 to 29.9 is considered overweight. ? A BMI of 30 and above is considered obese.  Watch levels of cholesterol and blood lipids  You should start having your blood tested for lipids and cholesterol at 72 years of age, then have this test every 5 years.  You may need to have your cholesterol levels checked more often if: ? Your lipid or cholesterol levels are high. ? You are older than 73 years of age. ? You are at high risk for heart disease.  Cancer screening Lung Cancer  Lung cancer screening is recommended for adults 25-90 years old who are at high risk for lung cancer because of a history of smoking.  A yearly low-dose CT scan of the lungs is recommended for people who: ? Currently smoke. ? Have quit within the past 15 years. ? Have at least a 30-pack-year history of smoking. A pack year is smoking an average of one pack of cigarettes a day for 1 year.  Yearly screening should continue until it has been 15 years since you quit.  Yearly screening should stop if you develop a health problem that would  prevent you from having lung cancer treatment.  Breast Cancer  Practice breast self-awareness. This means understanding how your breasts normally appear and feel.  It also means doing regular breast self-exams. Let your health care provider know about any changes, no matter how small.  If you are in your 20s or 30s, you should have a clinical breast exam (CBE) by a health care provider every 1-3 years as part of a regular health exam.  If you are 81 or older, have a CBE every year.  Also consider having a breast X-ray (mammogram) every year.  If you have a family history of breast cancer, talk to your health care provider about genetic screening.  If you are at high risk for breast cancer, talk to your health care provider about having an MRI and a mammogram every year.  Breast cancer gene (BRCA) assessment is recommended for women who have family members with BRCA-related cancers. BRCA-related cancers include: ? Breast. ? Ovarian. ? Tubal. ? Peritoneal cancers.  Results of the assessment will determine the need for genetic counseling and BRCA1 and BRCA2 testing.  Cervical Cancer Your health care provider may recommend that you be screened regularly for cancer of the pelvic organs (ovaries, uterus, and vagina). This screening involves a pelvic examination, including checking for microscopic changes to the surface of your cervix (Pap test). You may be encouraged to have this screening done every 3 years, beginning at age 52.  For women ages 21-65, health care providers may recommend pelvic exams and Pap testing every 3 years, or they may recommend the Pap and pelvic exam, combined with testing for human papilloma virus (HPV), every 5 years. Some types of HPV increase your risk of cervical cancer. Testing for HPV may also be done on women of any age with unclear Pap test results.  Other health care providers may not recommend any screening for nonpregnant women who are considered low risk for pelvic cancer and who do not have symptoms. Ask your health care provider if a screening pelvic exam is right for you.  If you have had past treatment for cervical cancer or a condition that could lead to cancer, you need Pap tests and screening for cancer for at least 20 years after your treatment. If Pap tests have been discontinued, your risk factors (such as having a new sexual partner) need to be reassessed to determine if screening should resume. Some women have medical problems  that increase the chance of getting cervical cancer. In these cases, your health care provider may recommend more frequent screening and Pap tests.  Colorectal Cancer  This type of cancer can be detected and often prevented.  Routine colorectal cancer screening usually begins at 73 years of age and continues through 73 years of age.  Your health care provider may recommend screening at an earlier age if you have risk factors for colon cancer.  Your health care provider may also recommend using home test kits to check for hidden blood in the stool.  A small camera at the end of a tube can be used to examine your colon directly (sigmoidoscopy or colonoscopy). This is done to check for the earliest forms of colorectal cancer.  Routine screening usually begins at age 21.  Direct examination of the colon should be repeated every 5-10 years through 73 years of age. However, you may need to be screened more often if early forms of precancerous polyps or small growths are found.  Skin Cancer  Check your  skin from head to toe regularly.  Tell your health care provider about any new moles or changes in moles, especially if there is a change in a mole's shape or color.  Also tell your health care provider if you have a mole that is larger than the size of a pencil eraser.  Always use sunscreen. Apply sunscreen liberally and repeatedly throughout the day.  Protect yourself by wearing long sleeves, pants, a wide-brimmed hat, and sunglasses whenever you are outside.  Heart disease, diabetes, and high blood pressure  High blood pressure causes heart disease and increases the risk of stroke. High blood pressure is more likely to develop in: ? People who have blood pressure in the high end of the normal range (130-139/85-89 mm Hg). ? People who are overweight or obese. ? People who are African American.  If you are 18-39 years of age, have your blood pressure checked every 3-5 years. If you are  40 years of age or older, have your blood pressure checked every year. You should have your blood pressure measured twice-once when you are at a hospital or clinic, and once when you are not at a hospital or clinic. Record the average of the two measurements. To check your blood pressure when you are not at a hospital or clinic, you can use: ? An automated blood pressure machine at a pharmacy. ? A home blood pressure monitor.  If you are between 55 years and 79 years old, ask your health care provider if you should take aspirin to prevent strokes.  Have regular diabetes screenings. This involves taking a blood sample to check your fasting blood sugar level. ? If you are at a normal weight and have a low risk for diabetes, have this test once every three years after 73 years of age. ? If you are overweight and have a high risk for diabetes, consider being tested at a younger age or more often. Preventing infection Hepatitis B  If you have a higher risk for hepatitis B, you should be screened for this virus. You are considered at high risk for hepatitis B if: ? You were born in a country where hepatitis B is common. Ask your health care provider which countries are considered high risk. ? Your parents were born in a high-risk country, and you have not been immunized against hepatitis B (hepatitis B vaccine). ? You have HIV or AIDS. ? You use needles to inject street drugs. ? You live with someone who has hepatitis B. ? You have had sex with someone who has hepatitis B. ? You get hemodialysis treatment. ? You take certain medicines for conditions, including cancer, organ transplantation, and autoimmune conditions.  Hepatitis C  Blood testing is recommended for: ? Everyone born from 1945 through 1965. ? Anyone with known risk factors for hepatitis C.  Sexually transmitted infections (STIs)  You should be screened for sexually transmitted infections (STIs) including gonorrhea and chlamydia  if: ? You are sexually active and are younger than 73 years of age. ? You are older than 73 years of age and your health care provider tells you that you are at risk for this type of infection. ? Your sexual activity has changed since you were last screened and you are at an increased risk for chlamydia or gonorrhea. Ask your health care provider if you are at risk.  If you do not have HIV, but are at risk, it may be recommended that you take a prescription medicine daily   to prevent HIV infection. This is called pre-exposure prophylaxis (PrEP). You are considered at risk if: ? You are sexually active and do not regularly use condoms or know the HIV status of your partner(s). ? You take drugs by injection. ? You are sexually active with a partner who has HIV.  Talk with your health care provider about whether you are at high risk of being infected with HIV. If you choose to begin PrEP, you should first be tested for HIV. You should then be tested every 3 months for as long as you are taking PrEP. Pregnancy  If you are premenopausal and you may become pregnant, ask your health care provider about preconception counseling.  If you may become pregnant, take 400 to 800 micrograms (mcg) of folic acid every day.  If you want to prevent pregnancy, talk to your health care provider about birth control (contraception). Osteoporosis and menopause  Osteoporosis is a disease in which the bones lose minerals and strength with aging. This can result in serious bone fractures. Your risk for osteoporosis can be identified using a bone density scan.  If you are 83 years of age or older, or if you are at risk for osteoporosis and fractures, ask your health care provider if you should be screened.  Ask your health care provider whether you should take a calcium or vitamin D supplement to lower your risk for osteoporosis.  Menopause may have certain physical symptoms and risks.  Hormone replacement therapy  may reduce some of these symptoms and risks. Talk to your health care provider about whether hormone replacement therapy is right for you. Follow these instructions at home:  Schedule regular health, dental, and eye exams.  Stay current with your immunizations.  Do not use any tobacco products including cigarettes, chewing tobacco, or electronic cigarettes.  If you are pregnant, do not drink alcohol.  If you are breastfeeding, limit how much and how often you drink alcohol.  Limit alcohol intake to no more than 1 drink per day for nonpregnant women. One drink equals 12 ounces of beer, 5 ounces of wine, or 1 ounces of hard liquor.  Do not use street drugs.  Do not share needles.  Ask your health care provider for help if you need support or information about quitting drugs.  Tell your health care provider if you often feel depressed.  Tell your health care provider if you have ever been abused or do not feel safe at home. This information is not intended to replace advice given to you by your health care provider. Make sure you discuss any questions you have with your health care provider. Document Released: 04/14/2011 Document Revised: 03/06/2016 Document Reviewed: 07/03/2015 Elsevier Interactive Patient Education  [employer].

## 2018-01-11 ENCOUNTER — Encounter: Payer: Self-pay | Admitting: Internal Medicine

## 2018-01-11 ENCOUNTER — Ambulatory Visit (INDEPENDENT_AMBULATORY_CARE_PROVIDER_SITE_OTHER): Payer: Medicare HMO | Admitting: Internal Medicine

## 2018-01-11 VITALS — BP 120/66 | HR 76 | Temp 97.7°F | Resp 16 | Ht 62.0 in | Wt 184.0 lb

## 2018-01-11 DIAGNOSIS — E7849 Other hyperlipidemia: Secondary | ICD-10-CM | POA: Diagnosis not present

## 2018-01-11 DIAGNOSIS — Z23 Encounter for immunization: Secondary | ICD-10-CM | POA: Diagnosis not present

## 2018-01-11 DIAGNOSIS — Z0000 Encounter for general adult medical examination without abnormal findings: Secondary | ICD-10-CM

## 2018-01-11 DIAGNOSIS — F329 Major depressive disorder, single episode, unspecified: Secondary | ICD-10-CM | POA: Diagnosis not present

## 2018-01-11 DIAGNOSIS — R7303 Prediabetes: Secondary | ICD-10-CM | POA: Diagnosis not present

## 2018-01-11 DIAGNOSIS — K219 Gastro-esophageal reflux disease without esophagitis: Secondary | ICD-10-CM | POA: Diagnosis not present

## 2018-01-11 DIAGNOSIS — R079 Chest pain, unspecified: Secondary | ICD-10-CM | POA: Diagnosis not present

## 2018-01-11 DIAGNOSIS — Z0001 Encounter for general adult medical examination with abnormal findings: Secondary | ICD-10-CM | POA: Diagnosis not present

## 2018-01-11 DIAGNOSIS — M7989 Other specified soft tissue disorders: Secondary | ICD-10-CM | POA: Diagnosis not present

## 2018-01-11 DIAGNOSIS — E2839 Other primary ovarian failure: Secondary | ICD-10-CM | POA: Diagnosis not present

## 2018-01-11 DIAGNOSIS — F32A Depression, unspecified: Secondary | ICD-10-CM

## 2018-01-11 DIAGNOSIS — R69 Illness, unspecified: Secondary | ICD-10-CM | POA: Diagnosis not present

## 2018-01-11 DIAGNOSIS — F419 Anxiety disorder, unspecified: Secondary | ICD-10-CM

## 2018-01-11 DIAGNOSIS — Z13820 Encounter for screening for osteoporosis: Secondary | ICD-10-CM

## 2018-01-11 NOTE — Assessment & Plan Note (Signed)
Occasional heartburn Will take over-the-counter medication as needed

## 2018-01-11 NOTE — Assessment & Plan Note (Signed)
Atypical chest pain for 2 days.  Worse with movement and deep breaths.  Associated with some nausea. Likely musculoskeletal in nature Chest pain has improved, but she is still experiencing some chest discomfort EKG today:Normal sinus rhythm 60 bpm, normal EKG, no change compared to old EKG from 2011 Anticipate chest pain resolving completely.  If it does not she will let me know

## 2018-01-11 NOTE — Assessment & Plan Note (Signed)
Has increased anxiety related to caring for her mother Take citalopram 20 mg daily and occasionally will take an extra 20 mg, which she finds helpful Overall she feels her anxiety is controlled with the current medication Continue current dose of citalopram Encouraged her to start exercising, which should also help with some of her anxiety

## 2018-01-11 NOTE — Assessment & Plan Note (Addendum)
Stopped the statin Has had achiness in past on statins Not on medication Discussed trying a statin again or Zetia.  Also discussed newer medications that are injections such as Repatha Check lipids, cmp Increase exercise, lose weight and revise diet

## 2018-01-11 NOTE — Assessment & Plan Note (Signed)
Controlled with current medication Continue citalopram at current dose

## 2018-01-11 NOTE — Assessment & Plan Note (Signed)
Check a1c Low sugar / carb diet Stressed regular exercise, weight loss  

## 2018-01-14 ENCOUNTER — Ambulatory Visit (HOSPITAL_COMMUNITY)
Admission: RE | Admit: 2018-01-14 | Discharge: 2018-01-14 | Disposition: A | Discharge status: Home / Self Care | Payer: Medicare HMO | Source: Ambulatory Visit | Attending: Cardiovascular Disease | Admitting: Cardiovascular Disease

## 2018-01-14 DIAGNOSIS — M7989 Other specified soft tissue disorders: Secondary | ICD-10-CM | POA: Diagnosis not present

## 2018-01-14 DIAGNOSIS — R69 Illness, unspecified: Secondary | ICD-10-CM | POA: Diagnosis not present

## 2018-02-04 ENCOUNTER — Other Ambulatory Visit: Payer: Self-pay | Admitting: Internal Medicine

## 2018-02-17 ENCOUNTER — Ambulatory Visit
Admission: RE | Admit: 2018-02-17 | Discharge: 2018-02-17 | Disposition: A | Discharge status: Home / Self Care | Payer: Medicare HMO | Source: Ambulatory Visit | Attending: Internal Medicine | Admitting: Internal Medicine

## 2018-02-17 DIAGNOSIS — Z13820 Encounter for screening for osteoporosis: Secondary | ICD-10-CM

## 2018-02-17 DIAGNOSIS — M85851 Other specified disorders of bone density and structure, right thigh: Secondary | ICD-10-CM | POA: Diagnosis not present

## 2018-02-17 DIAGNOSIS — E2839 Other primary ovarian failure: Secondary | ICD-10-CM

## 2018-02-17 DIAGNOSIS — Z780 Asymptomatic menopausal state: Secondary | ICD-10-CM | POA: Diagnosis not present

## 2018-02-18 ENCOUNTER — Encounter: Payer: Self-pay | Admitting: Internal Medicine

## 2018-02-18 DIAGNOSIS — M8580 Other specified disorders of bone density and structure, unspecified site: Secondary | ICD-10-CM | POA: Insufficient documentation

## 2018-03-18 ENCOUNTER — Ambulatory Visit (INDEPENDENT_AMBULATORY_CARE_PROVIDER_SITE_OTHER): Payer: Medicare HMO | Admitting: *Deleted

## 2018-03-18 VITALS — BP 126/66 | HR 69 | Resp 18 | Ht 62.0 in | Wt 182.0 lb

## 2018-03-18 DIAGNOSIS — Z0000 Encounter for general adult medical examination without abnormal findings: Secondary | ICD-10-CM | POA: Diagnosis not present

## 2018-03-18 NOTE — Progress Notes (Signed)
Subjective:   Pam Butler is a 72 y.o. female who presents for an Initial Medicare Annual Wellness Visit.  Review of Systems    No ROS.  Medicare Wellness Visit. Additional risk factors are reflected in the social history.  Cardiac Risk Factors include: advanced age (>66men, >62 women);dyslipidemia Sleep patterns: is not rested upon awakening, gets up 1 times nightly to void and sleeps 5-6 hours nightly.  Patient reports insomnia issues, discussed recommended sleep tips and stress reduction tips, education was attached to patient's AVS.  Home Safety/Smoke Alarms: Feels safe in home. Smoke alarms in place.  Living environment; residence and Firearm Safety: 1-story house/ trailer, no firearms. Lives with hsband no needs for DME, good support system Seat Belt Safety/Bike Helmet: Wears seat belt.     Objective:    Today's Vitals   03/18/18 1417  BP: 126/66  Pulse: 69  Resp: 18  SpO2: 98%  Weight: 182 lb (82.6 kg)  Height: 5\' 2"  (1.575 m)   Body mass index is 33.29 kg/m.  Advanced Directives 03/18/2018 01/29/2016  Does Patient Have a Medical Advance Directive? No No  Would patient like information on creating a medical advance directive? Yes (ED - Information included in AVS) -    Current Medications (verified) Outpatient Encounter Medications as of 03/18/2018  Medication Sig  . cetirizine (ZYRTEC) 10 MG tablet TAKE 1 TABLET BY MOUTH EVERY DAY  . Cholecalciferol (D3-1000 PO) Take by mouth. Take 2 pills daily  . citalopram (CELEXA) 10 MG tablet TAKE 1 TABLET BY MOUTH EVERY DAY  . Lysine 500 MG TABS Takes one pill daily (Patient taking differently: 1 tablet as needed. Takes one pill daily)  . [DISCONTINUED] citalopram (CELEXA) 40 MG tablet Take 0.5 tablets (20 mg total) by mouth daily. For total of 30 mg daily (Patient not taking: Reported on 03/18/2018)  . [DISCONTINUED] Multiple Vitamins-Minerals (CENTRUM SILVER 50+WOMEN) TABS Take 1 tablet by mouth daily. (Patient not taking:  Reported on 01/11/2018)  . [DISCONTINUED] Omega-3 Fatty Acids (OMEGA 3 PO) Take by mouth daily.   No facility-administered encounter medications on file as of 03/18/2018.     Allergies (verified) Penicillins; Ciprofloxacin; Codeine; and Latex   History: Past Medical History:  Diagnosis Date  . Allergy   . Anxiety   . Depression   . History of bloody stools    in past/   Past Surgical History:  Procedure Laterality Date  . ABDOMINAL HYSTERECTOMY     with bladder tack  . BREAST BIOPSY Right 2001  . CHOLECYSTECTOMY    . TUBAL LIGATION     Family History  Problem Relation Age of Onset  . Other Mother        Prediabetes  . Colitis Mother   . Pneumonia Father   . Heart attack Paternal Grandmother 80  . Heart attack Maternal Uncle 67  . Colon cancer Neg Hx    Social History   Socioeconomic History  . Marital status: Married    Spouse name: Not on file  . Number of children: 2  . Years of education: Not on file  . Highest education level: Not on file  Occupational History  . Not on file  Social Needs  . Financial resource strain: Not hard at all  . Food insecurity:    Worry: Never true    Inability: Never true  . Transportation needs:    Medical: No    Non-medical: No  Tobacco Use  . Smoking status: Never Smoker  .  Smokeless tobacco: Never Used  Substance and Sexual Activity  . Alcohol use: No    Alcohol/week: 0.0 oz  . Drug use: No  . Sexual activity: Never  Lifestyle  . Physical activity:    Days per week: 5 days    Minutes per session: 50 min  . Stress: Rather much  Relationships  . Social connections:    Talks on phone: More than three times a week    Gets together: More than three times a week    Attends religious service: More than 4 times per year    Active member of club or organization: Yes    Attends meetings of clubs or organizations: More than 4 times per year    Relationship status: Married  Other Topics Concern  . Not on file  Social  History Narrative   Exercise: none    Tobacco Counseling Counseling given: Not Answered   Activities of Daily Living In your present state of health, do you have any difficulty performing the following activities: 03/18/2018  Hearing? N  Vision? N  Difficulty concentrating or making decisions? N  Walking or climbing stairs? N  Dressing or bathing? N  Doing errands, shopping? N  Preparing Food and eating ? N  Using the Toilet? N  In the past six months, have you accidently leaked urine? N  Do you have problems with loss of bowel control? N  Managing your Medications? N  Managing your Finances? N  Housekeeping or managing your Housekeeping? N  Some recent data might be hidden     Immunizations and Health Maintenance Immunization History  Administered Date(s) Administered  . Influenza Whole 07/06/2008, 07/19/2010  . Influenza,inj,Quad PF,6+ Mos 08/09/2016  . Pneumococcal Conjugate-13 11/12/2016  . Pneumococcal Polysaccharide-23 01/11/2018  . Td 06/30/2005   Health Maintenance Due  Topic Date Due  . Samul Dada  07/01/2015    Patient Care Team: [name], MD as PCP - General (Internal Medicine)  Indicate any recent Medical Services you may have received from other than [facility] providers in the past year (date may be approximate).     Assessment:   This is a routine wellness examination for Pam Butler. Physical assessment deferred to PCP.   Hearing/Vision screen Hearing Screening Comments: Able to hear conversational tones w/o difficulty. No issues reported.  Passed whisper test Vision Screening Comments: appointment yearly Dr. Bing Plume  Dietary issues and exercise activities discussed: Current Exercise Habits: The patient does not participate in regular exercise at present(chair exercise pamphlets provided), Exercise limited by: orthopedic condition(s)  Diet (meal preparation, eat out, water intake, caffeinated beverages, dairy products, fruits and vegetables): in  general, a "healthy" diet  , well balanced   Reviewed heart healthy diet, Encouraged patient to increase daily water and fluid intake.  Goals    . Patient Stated     I want to start to take one day a week for myself. I will talk to my sister and discuss her staying with mother a certain day every week.      Depression Screen PHQ 2/9 Scores 03/18/2018 11/12/2016 03/10/2013  PHQ - 2 Score 2 1 2   PHQ- 9 Score 7 - 9    Fall Risk Fall Risk  03/18/2018 11/12/2016 03/10/2013  Falls in the past year? Yes No No  Number falls in past yr: 1 - -  Injury with Fall? No - -  Follow up Education provided;Falls prevention discussed - -   Cognitive Function:       Ad8  score reviewed for issues:  Issues making decisions: no  Less interest in hobbies / activities: no  Repeats questions, stories (family complaining): no  Trouble using ordinary gadgets (microwave, computer, phone):no  Forgets the month or year: no  Mismanaging finances: no  Remembering appts: no  Daily problems with thinking and/or memory: no Ad8 score is= 0    Screening Tests Health Maintenance  Topic Date Due  . TETANUS/TDAP  07/01/2015  . INFLUENZA VACCINE  05/13/2018  . COLONOSCOPY  01/29/2019  . MAMMOGRAM  12/03/2019  . DEXA SCAN  02/17/2021  . Hepatitis C Screening  Completed  . PNA vac Low Risk Adult  Completed     Plan:      Continue doing brain stimulating activities (puzzles, reading, adult coloring books, staying active) to keep memory sharp.   Continue to eat heart healthy diet (full of fruits, vegetables, whole grains, lean protein, water--limit salt, fat, and sugar intake) and increase physical activity as tolerated.  I have personally reviewed and noted the following in the patient's chart:   . Medical and social history . Use of alcohol, tobacco or illicit drugs  . Current medications and supplements . Functional ability and status . Nutritional status . Physical activity . Advanced  directives . List of other physicians . Vitals . Screenings to include cognitive, depression, and falls . Referrals and appointments  In addition, I have reviewed and discussed with patient certain preventive protocols, quality metrics, and best practice recommendations. A written personalized care plan for preventive services as well as general preventive health recommendations were provided to patient.     Michiel Cowboy, RN   03/18/2018

## 2018-03-18 NOTE — Patient Instructions (Addendum)
Continue doing brain stimulating activities (puzzles, reading, adult coloring books, staying active) to keep memory sharp.   Continue to eat heart healthy diet (full of fruits, vegetables, whole grains, lean protein, water--limit salt, fat, and sugar intake) and increase physical activity as tolerated.   [name] , Thank you for taking time to come for your Medicare Wellness Visit. I appreciate your ongoing commitment to your health goals. Please review the following plan we discussed and let me know if I can assist you in the future.   These are the goals we discussed: Goals    . Patient Stated     I want to start to take one day a week for myself. I will talk to my sister and discuss her staying with mother a certain day every week.       This is a list of the screening recommended for you and due dates:  Health Maintenance  Topic Date Due  . Tetanus Vaccine  07/01/2015  . Flu Shot  05/13/2018  . Colon Cancer Screening  01/29/2019  . Mammogram  12/03/2019  . DEXA scan (bone density measurement)  02/17/2021  .  Hepatitis C: One time screening is recommended by Center for Disease Control  (CDC) for  adults born from [birth year] through 1966.   Completed  . Pneumonia vaccines  Completed     It is important to avoid accidents which may result in broken bones.  Here are a few ideas on how to make your home safer so you will be less likely to trip or fall.  1. Use nonskid mats or non slip strips in your shower or tub, on your bathroom floor and around sinks.  If you know that you have spilled water, wipe it up! 2. In the bathroom, it is important to have properly installed grab bars on the walls or on the edge of the tub.  Towel racks are NOT strong enough for you to hold onto or to pull on for support. 3. Stairs and hallways should have enough light.  Add lamps or night lights if you need ore light. 4. It is good to have handrails on both sides of the stairs if possible.  Always fix broken  handrails right away. 5. It is important to see the edges of steps.  Paint the edges of outdoor steps white so you can see them better.  Put colored tape on the edge of inside steps. 6. Throw-rugs are dangerous because they can slide.  Removing the rugs is the best idea, but if they must stay, add adhesive carpet tape to prevent slipping. 7. Do not keep things on stairs or in the halls.  Remove small furniture that blocks the halls as it may cause you to trip.  Keep telephone and electrical cords out of the way where you walk. 8. Always were sturdy, rubber-soled shoes for good support.  Never wear just socks, especially on the stairs.  Socks may cause you to slip or fall.  Do not wear full-length housecoats as you can easily trip on the bottom.  9. Place the things you use the most on the shelves that are the easiest to reach.  If you use a stepstool, make sure it is in good condition.  If you feel unsteady, DO NOT climb, ask for help. 10. If a health professional advises you to use a cane or walker, do not be ashamed.  These items can keep you from falling and breaking your bones.

## 2018-03-19 NOTE — Progress Notes (Signed)
Medical screening examination/treatment/procedure(s) were performed by the Wellness Coach, RN. As primary care provider I was immediately available for consulation/collaboration. I agree with above documentation. Kaleena Corrow, NP  

## 2018-05-08 ENCOUNTER — Other Ambulatory Visit: Payer: Self-pay | Admitting: Internal Medicine

## 2018-05-18 DIAGNOSIS — R69 Illness, unspecified: Secondary | ICD-10-CM | POA: Diagnosis not present

## 2018-05-31 DIAGNOSIS — R69 Illness, unspecified: Secondary | ICD-10-CM | POA: Diagnosis not present

## 2018-06-03 DIAGNOSIS — R69 Illness, unspecified: Secondary | ICD-10-CM | POA: Diagnosis not present

## 2018-06-10 DIAGNOSIS — R69 Illness, unspecified: Secondary | ICD-10-CM | POA: Diagnosis not present

## 2018-07-01 DIAGNOSIS — L814 Other melanin hyperpigmentation: Secondary | ICD-10-CM | POA: Diagnosis not present

## 2018-07-01 DIAGNOSIS — D2261 Melanocytic nevi of right upper limb, including shoulder: Secondary | ICD-10-CM | POA: Diagnosis not present

## 2018-07-01 DIAGNOSIS — L820 Inflamed seborrheic keratosis: Secondary | ICD-10-CM | POA: Diagnosis not present

## 2018-07-01 DIAGNOSIS — L570 Actinic keratosis: Secondary | ICD-10-CM | POA: Diagnosis not present

## 2018-07-01 DIAGNOSIS — D225 Melanocytic nevi of trunk: Secondary | ICD-10-CM | POA: Diagnosis not present

## 2018-07-01 DIAGNOSIS — L821 Other seborrheic keratosis: Secondary | ICD-10-CM | POA: Diagnosis not present

## 2018-07-01 DIAGNOSIS — D2262 Melanocytic nevi of left upper limb, including shoulder: Secondary | ICD-10-CM | POA: Diagnosis not present

## 2018-07-11 NOTE — Progress Notes (Signed)
Subjective:    Patient ID: Pam Butler, female    DOB: 02-Mar-1946, 72 y.o.   MRN: [id number]  HPI The patient is here for follow up.  Her mom lives with her and she is her caregiver.  She has a lot of stress related to that.    Anxiety, depression: She is taking her citalopram daily as prescribed. She denies any side effects from the medication. She feels her anxiety is not well controlled.  She denies any concerning depression-feels at times mildly depressed, but feels it is more related to the stress of caring for her mother.  Prediabetes:  She is not compliant with a low sugar/carbohydrate diet.  With the increased stress and anxiety she has been eating more and has not been eating well.  She has gained some weight.  She is not exercising regularly.  Hyperlipidemia: She is no on any medication. She has not tolerated statins in the past due to muscle aches.  She is compliant with a low fat/cholesterol diet. She is not exercising regularly.     Medications and allergies reviewed with patient and updated if appropriate.  Patient Active Problem List   Diagnosis Date Noted  . Osteopenia 02/18/2018  . Chest pain at rest 01/11/2018  . Prediabetes 11/12/2016  . Anxiety 03/08/2010  . IBS 03/08/2010  . Hyperlipidemia 05/05/2008  . Depression 05/05/2008  . GERD 05/05/2008    Current Outpatient Medications on File Prior to Visit  Medication Sig Dispense Refill  . cetirizine (ZYRTEC) 10 MG tablet TAKE 1 TABLET BY MOUTH EVERY DAY 90 tablet 1  . Cholecalciferol (D3-1000 PO) Take by mouth. Take 2 pills daily    . citalopram (CELEXA) 10 MG tablet TAKE 1 TABLET BY MOUTH EVERY DAY 90 tablet 1   No current facility-administered medications on file prior to visit.     Past Medical History:  Diagnosis Date  . Allergy   . Anxiety   . Depression   . History of bloody stools    in past/    Past Surgical History:  Procedure Laterality Date  . ABDOMINAL HYSTERECTOMY     with bladder  tack  . BREAST BIOPSY Right 2001  . CHOLECYSTECTOMY    . TUBAL LIGATION      Social History   Socioeconomic History  . Marital status: Married    Spouse name: Not on file  . Number of children: 2  . Years of education: Not on file  . Highest education level: Not on file  Occupational History  . Not on file  Social Needs  . Financial resource strain: Not hard at all  . Food insecurity:    Worry: Never true    Inability: Never true  . Transportation needs:    Medical: No    Non-medical: No  Tobacco Use  . Smoking status: Never Smoker  . Smokeless tobacco: Never Used  Substance and Sexual Activity  . Alcohol use: No    Alcohol/week: 0.0 standard drinks  . Drug use: No  . Sexual activity: Never  Lifestyle  . Physical activity:    Days per week: 5 days    Minutes per session: 50 min  . Stress: Rather much  Relationships  . Social connections:    Talks on phone: More than three times a week    Gets together: More than three times a week    Attends religious service: More than 4 times per year    Active member of club or  organization: Yes    Attends meetings of clubs or organizations: More than 4 times per year    Relationship status: Married  Other Topics Concern  . Not on file  Social History Narrative   Exercise: none    Family History  Problem Relation Age of Onset  . Other Mother        Prediabetes  . Colitis Mother   . Pneumonia Father   . Heart attack Paternal Grandmother 99  . Heart attack Maternal Uncle 67  . Colon cancer Neg Hx     Review of Systems  Constitutional: Negative for chills and fever.  Respiratory: Negative for cough, shortness of breath and wheezing.   Cardiovascular: Positive for leg swelling (mild). Negative for chest pain and palpitations.  Neurological: Negative for light-headedness and headaches.  Psychiatric/Behavioral: Positive for dysphoric mood. The patient is nervous/anxious.        Objective:   Vitals:   07/13/18  0825  BP: 116/64  Pulse: 70  Resp: 16  Temp: 98.4 F (36.9 C)  SpO2: 98%   BP Readings from Last 3 Encounters:  07/13/18 116/64  03/18/18 126/66  01/11/18 120/66   Wt Readings from Last 3 Encounters:  07/13/18 186 lb (84.4 kg)  03/18/18 182 lb (82.6 kg)  01/11/18 184 lb (83.5 kg)   Body mass index is 34.02 kg/m.   Physical Exam    Constitutional: Appears well-developed and well-nourished. No distress.  HENT:  Head: Normocephalic and atraumatic.  Neck: Neck supple. No tracheal deviation present. No thyromegaly present.  No cervical lymphadenopathy Cardiovascular: Normal rate, regular rhythm and normal heart sounds.   No murmur heard. No carotid bruit .  No edema Pulmonary/Chest: Effort normal and breath sounds normal. No respiratory distress. No has no wheezes. No rales.  Skin: Skin is warm and dry. Not diaphoretic.  Psychiatric: Mildly anxious mood and affect. Behavior is normal.      Assessment & Plan:    See Problem List for Assessment and Plan of chronic medical problems.

## 2018-07-11 NOTE — Patient Instructions (Addendum)
  Tests ordered today. Your results will be released to [facility] (or called to you) after review, usually within 72hours after test completion. If any changes need to be made, you will be notified at that same time.  Flu immunization administered today.    Medications reviewed and updated.  Changes include :   increasing Celexa to 20 mg daily  Your prescription(s) have been submitted to your pharmacy. Please take as directed and contact our office if you believe you are having problem(s) with the medication(s).  Consider Zetia or Fenofibrate for your cholesterol.  These are not statins.     Please followup in 6 months

## 2018-07-13 ENCOUNTER — Other Ambulatory Visit (INDEPENDENT_AMBULATORY_CARE_PROVIDER_SITE_OTHER): Payer: Medicare HMO

## 2018-07-13 ENCOUNTER — Encounter: Payer: Self-pay | Admitting: Internal Medicine

## 2018-07-13 ENCOUNTER — Ambulatory Visit (INDEPENDENT_AMBULATORY_CARE_PROVIDER_SITE_OTHER): Payer: Medicare HMO | Admitting: Internal Medicine

## 2018-07-13 VITALS — BP 116/64 | HR 70 | Temp 98.4°F | Resp 16 | Ht 62.0 in | Wt 186.0 lb

## 2018-07-13 DIAGNOSIS — R69 Illness, unspecified: Secondary | ICD-10-CM | POA: Diagnosis not present

## 2018-07-13 DIAGNOSIS — F329 Major depressive disorder, single episode, unspecified: Secondary | ICD-10-CM | POA: Diagnosis not present

## 2018-07-13 DIAGNOSIS — E7849 Other hyperlipidemia: Secondary | ICD-10-CM

## 2018-07-13 DIAGNOSIS — G8929 Other chronic pain: Secondary | ICD-10-CM | POA: Diagnosis not present

## 2018-07-13 DIAGNOSIS — Z23 Encounter for immunization: Secondary | ICD-10-CM | POA: Diagnosis not present

## 2018-07-13 DIAGNOSIS — R7303 Prediabetes: Secondary | ICD-10-CM | POA: Diagnosis not present

## 2018-07-13 DIAGNOSIS — E669 Obesity, unspecified: Secondary | ICD-10-CM

## 2018-07-13 DIAGNOSIS — M25511 Pain in right shoulder: Secondary | ICD-10-CM

## 2018-07-13 DIAGNOSIS — F419 Anxiety disorder, unspecified: Secondary | ICD-10-CM

## 2018-07-13 DIAGNOSIS — F32A Depression, unspecified: Secondary | ICD-10-CM

## 2018-07-13 LAB — COMPREHENSIVE METABOLIC PANEL
ALBUMIN: 4.5 g/dL (ref 3.5–5.2)
ALT: 16 U/L (ref 0–35)
AST: 18 U/L (ref 0–37)
Alkaline Phosphatase: 60 U/L (ref 39–117)
BUN: 21 mg/dL (ref 6–23)
CALCIUM: 9.5 mg/dL (ref 8.4–10.5)
CHLORIDE: 104 meq/L (ref 96–112)
CO2: 23 mEq/L (ref 19–32)
Creatinine, Ser: 1 mg/dL (ref 0.40–1.20)
GFR: 57.71 mL/min — ABNORMAL LOW (ref >60.00)
Glucose, Bld: 97 mg/dL (ref 70–99)
POTASSIUM: 3.8 meq/L (ref 3.5–5.1)
Sodium: 137 mEq/L (ref 135–145)
Total Bilirubin: 0.4 mg/dL (ref 0.2–1.2)
Total Protein: 7.6 g/dL (ref 6.0–8.3)

## 2018-07-13 LAB — CBC WITH DIFFERENTIAL/PLATELET
BASOS ABS: 0.1 10*3/uL (ref 0.0–0.1)
Basophils Relative: 1.1 % (ref 0.0–3.0)
EOS ABS: 0.2 10*3/uL (ref 0.0–0.7)
Eosinophils Relative: 3.1 % (ref 0.0–5.0)
HCT: 37 % (ref 36.0–46.0)
Hemoglobin: 13 g/dL (ref 12.0–15.0)
LYMPHS ABS: 2.7 10*3/uL (ref 0.7–4.0)
Lymphocytes Relative: 37.7 % (ref 12.0–46.0)
MCHC: 35.1 g/dL (ref 30.0–36.0)
MCV: 87 fl (ref 78.0–100.0)
Monocytes Absolute: 0.7 10*3/uL (ref 0.1–1.0)
Monocytes Relative: 10 % (ref 3.0–12.0)
NEUTROS PCT: 48.1 % (ref 43.0–77.0)
Neutro Abs: 3.5 10*3/uL (ref 1.4–7.7)
PLATELETS: 288 10*3/uL (ref 150.0–400.0)
RBC: 4.26 Mil/uL (ref 3.87–5.11)
RDW: 13.3 % (ref 11.5–15.5)
WBC: 7.3 10*3/uL (ref 4.0–10.5)

## 2018-07-13 LAB — LIPID PANEL
CHOLESTEROL: 266 mg/dL — AB (ref 0–200)
HDL: 35.8 mg/dL — AB (ref >39.00)
Total CHOL/HDL Ratio: 7
Triglycerides: 417 mg/dL — ABNORMAL HIGH (ref 0.0–149.0)

## 2018-07-13 LAB — HEMOGLOBIN A1C: Hgb A1c MFr Bld: 5.5 % (ref 4.6–6.5)

## 2018-07-13 LAB — LDL CHOLESTEROL, DIRECT: LDL DIRECT: 151 mg/dL

## 2018-07-13 MED ORDER — CITALOPRAM HYDROBROMIDE 20 MG PO TABS: 20.0000 mg | ORAL_TABLET | Freq: Every day | ORAL | 3 refills | Status: DC

## 2018-07-13 NOTE — Assessment & Plan Note (Addendum)
Check lipid panel  Not currently on any medication because she did not tolerate statins-discussed considering fenofibrate or Zetia Regular exercise and healthy diet encouraged

## 2018-07-13 NOTE — Assessment & Plan Note (Signed)
BMI 34.02 She has gained weight since she was here last Some of her weight gain is related to stress eating-we will work on controlling her anxiety/stress/depression better Discussed at length starting back with regular exercise, which she has, in the past Discussed lifestyle changes-decreased portions, healthier diet She is motivated to make the change and knows she will feel better Follow-up in 6 months at her next visit, sooner if needed

## 2018-07-13 NOTE — Assessment & Plan Note (Signed)
Increased anxiety and stress related to being the caregiver of her mother who lives with her Anxiety and stress not ideally controlled Increase Celexa to 20 mg daily Discussed the importance of restarting regular exercise, which she has done in the past and she knows this will also help with her depression/anxiety

## 2018-07-13 NOTE — Assessment & Plan Note (Signed)
Not ideally controlled Will increase Celexa 20 mg daily Most of her mild depression is likely related to increased stress and anxiety related to being the caregiver for her mother

## 2018-07-13 NOTE — Assessment & Plan Note (Addendum)
Check a1c Low sugar / carb diet Stressed regular exercise, weight loss-discussed this at length

## 2018-07-27 NOTE — Progress Notes (Signed)
[facility] [address], [place] [address] Phone: [phone] Subjective:    I Pam Butler am serving as a Education administrator for Dr. Hulan Saas.   CC: Right shoulder pain  LNL:[id number]  Pam Butler is a 72 y.o. female coming in with complaint of right shoulder pain. When she reaches she uses the other arm to get her arm higher. No neck pain noted. No numbness and tingling noted. Pain in the morning. Loss of ROM.  Onset- March Location- Lateral bicep area  Character- Sore Aggravating factors- flexion (overhead), adduction Reliving factors- Tylenol Therapies tried-  Severity-7 out of 10     Past Medical History:  Diagnosis Date  . Allergy   . Anxiety   . Depression   . History of bloody stools    in past/   Past Surgical History:  Procedure Laterality Date  . ABDOMINAL HYSTERECTOMY     with bladder tack  . BREAST BIOPSY Right 2001  . CHOLECYSTECTOMY    . TUBAL LIGATION     Social History   Socioeconomic History  . Marital status: Married    Spouse name: Not on file  . Number of children: 2  . Years of education: Not on file  . Highest education level: Not on file  Occupational History  . Not on file  Social Needs  . Financial resource strain: Not hard at all  . Food insecurity:    Worry: Never true    Inability: Never true  . Transportation needs:    Medical: No    Non-medical: No  Tobacco Use  . Smoking status: Never Smoker  . Smokeless tobacco: Never Used  Substance and Sexual Activity  . Alcohol use: No    Alcohol/week: 0.0 standard drinks  . Drug use: No  . Sexual activity: Never  Lifestyle  . Physical activity:    Days per week: 5 days    Minutes per session: 50 min  . Stress: Rather much  Relationships  . Social connections:    Talks on phone: More than three times a week    Gets together: More than three times a week    Attends religious service: More than 4 times per year    Active member of club or  organization: Yes    Attends meetings of clubs or organizations: More than 4 times per year    Relationship status: Married  Other Topics Concern  . Not on file  Social History Narrative   Exercise: none   Allergies  Allergen Reactions  . Penicillins     Hives  . Ciprofloxacin     Itching and hives  . Codeine     Itching(note: She has taken codeine containing cough preparations since)  . Latex     Causes swelling around face   Family History  Problem Relation Age of Onset  . Other Mother        Prediabetes  . Colitis Mother   . Pneumonia Father   . Heart attack Paternal Grandmother 4  . Heart attack Maternal Uncle 67  . Colon cancer Neg Hx       Current Outpatient Medications (Respiratory):  .  cetirizine (ZYRTEC) 10 MG tablet, TAKE 1 TABLET BY MOUTH EVERY DAY    Current Outpatient Medications (Other):  Marland Kitchen  Cholecalciferol (D3-1000 PO), Take by mouth. Take 2 pills daily .  citalopram (CELEXA) 20 MG tablet, Take 1 tablet (20 mg total) by mouth daily.  Past medical history, social, surgical and family history all reviewed in electronic medical record.  No pertanent information unless stated regarding to the chief complaint.   Review of Systems:  No headache, visual changes, nausea, vomiting, diarrhea, constipation, dizziness, abdominal pain, skin rash, fevers, chills, night sweats, weight loss, swollen lymph nodes, body aches, joint swelling, muscle aches, chest pain, shortness of breath, mood changes.   Objective  Blood pressure 132/90, pulse 62, height 5\' 2"  (1.575 m), weight 188 lb (85.3 kg), SpO2 92 %.    General: No apparent distress alert and oriented x3 mood and affect normal, dressed appropriately.  HEENT: Pupils equal, extraocular movements intact  Respiratory: Patient's speak in full sentences and does not appear short of breath  Cardiovascular: No lower extremity edema, non tender, no erythema  Skin: Warm dry intact with no signs of infection or rash  on extremities or on axial skeleton.  Abdomen: Soft nontender  Neuro: Cranial nerves II through XII are intact, neurovascularly intact in all extremities with 2+ DTRs and 2+ pulses.  Lymph: No lymphadenopathy of posterior or anterior cervical chain or axillae bilaterally.  Gait mild antalgic MSK:  Non tender with mild limited range of motion and good stability and symmetric strength and tone of  elbows, wrist, hip, knee and ankles bilaterally.  Shoulder: Right Inspection reveals no abnormalities, atrophy or asymmetry. Palpation is normal with no tenderness over AC joint or bicipital groove. Limited range of motion.  Lacks last 5 degrees of rotation laterally flexed 10 degrees of internal rotation. Rotator cuff strength 4+ out of 5 but approximately the same as the contralateral side No signs of impingement with negative Neer and Hawkin's tests, empty can sign. Speeds and Yergason's tests normal. Mild positive crossover Normal scapular function observed. No painful arc and no drop arm sign. No apprehension sign  MSK US performed of: Right shoulder This study was ordered, performed, and interpreted by Charlann Boxer D.O.  Shoulder:   Supraspinatus: Degenerative tearing noted.  Thickening of the anterior and superior capsule Subscapularis: Mild degenerative changes AC joint: Mild to moderate arthritic changes Glenohumeral Joint:  Appears normal without effusion. Glenoid Labrum:  Intact without visualized tears. 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  This included exercises focusing on stretching, strengthening, with significant focus on eccentric aspects.   Long term goals include an improvement in range of motion, strength, endurance as well as avoiding reinjury. Patient's frequency would include in  1-2 times a day, 3-5 times a week for a duration of 6-12 weeks. Shoulder Exercises that included:  Basic scapular stabilization to include adduction and depression of scapula Scaption, focusing on proper movement and good control Internal and External rotation utilizing a theraband, with elbow tucked at side entire time Rows with theraband which was given   Proper technique shown and discussed handout in great detail with ATC.  All questions were discussed and answered.      Impression and Recommendations:     This case required medical decision making of moderate complexity. The above documentation has been reviewed and is accurate and complete Lyndal Pulley, DO       Note: This dictation was prepared with Dragon dictation along with smaller phrase technology. Any transcriptional errors that result from this process are unintentional.

## 2018-07-28 ENCOUNTER — Encounter: Payer: Self-pay | Admitting: Family Medicine

## 2018-07-28 ENCOUNTER — Ambulatory Visit: Payer: Medicare HMO | Admitting: Family Medicine

## 2018-07-28 ENCOUNTER — Ambulatory Visit: Payer: Self-pay

## 2018-07-28 VITALS — BP 132/90 | HR 62 | Ht 62.0 in | Wt 188.0 lb

## 2018-07-28 DIAGNOSIS — G8929 Other chronic pain: Secondary | ICD-10-CM

## 2018-07-28 DIAGNOSIS — M7501 Adhesive capsulitis of right shoulder: Secondary | ICD-10-CM | POA: Diagnosis not present

## 2018-07-28 DIAGNOSIS — M25511 Pain in right shoulder: Secondary | ICD-10-CM | POA: Diagnosis not present

## 2018-07-28 DIAGNOSIS — M7500 Adhesive capsulitis of unspecified shoulder: Secondary | ICD-10-CM | POA: Insufficient documentation

## 2018-07-28 NOTE — Assessment & Plan Note (Signed)
Frozen shoulder syndrome.  Some mild degenerative tearing of the rotator cuff with some mild underlying arthritis but nothing severe.  Discussed with patient about home exercises and work with Product/process development scientist.  Discussed topical anti-inflammatories and icing regimen.  Discussed which activities of doing which wants to avoid.  We discussed proper lifting mechanics.  Topical anti-inflammatories given.  Discussed the importance of vitamin D.  Follow-up again in 4 to 6 weeks

## 2018-07-28 NOTE — Patient Instructions (Signed)
Good to see you  Ice 20 minutes 2 times daily. Usually after activity and before bed. Exercises 3 times a week.  pennsaid pinkie amount topically 2 times daily as needed.  Keep moving the arm but limit heavy lifting.  Over the counter get  Vitamin D 4000-5000 IU daily  Tart cherry extract any dose at night but capsules See me again in 4-6 weeks

## 2018-08-07 ENCOUNTER — Other Ambulatory Visit: Payer: Self-pay | Admitting: Internal Medicine

## 2018-08-11 DIAGNOSIS — R69 Illness, unspecified: Secondary | ICD-10-CM | POA: Diagnosis not present

## 2018-08-25 ENCOUNTER — Ambulatory Visit: Payer: Medicare HMO | Admitting: Family Medicine

## 2018-11-22 ENCOUNTER — Encounter: Payer: Self-pay | Admitting: Gastroenterology

## 2018-11-22 ENCOUNTER — Other Ambulatory Visit: Payer: Self-pay | Admitting: Internal Medicine

## 2018-11-22 DIAGNOSIS — Z1231 Encounter for screening mammogram for malignant neoplasm of breast: Secondary | ICD-10-CM

## 2018-12-09 DIAGNOSIS — R69 Illness, unspecified: Secondary | ICD-10-CM | POA: Diagnosis not present

## 2018-12-17 ENCOUNTER — Ambulatory Visit
Admission: RE | Admit: 2018-12-17 | Discharge: 2018-12-17 | Disposition: A | Discharge status: Home / Self Care | Payer: Medicare HMO | Source: Ambulatory Visit | Attending: Internal Medicine | Admitting: Internal Medicine

## 2018-12-17 DIAGNOSIS — Z1231 Encounter for screening mammogram for malignant neoplasm of breast: Secondary | ICD-10-CM

## 2018-12-23 ENCOUNTER — Other Ambulatory Visit: Payer: Self-pay

## 2019-01-12 ENCOUNTER — Ambulatory Visit: Payer: Medicare HMO | Admitting: Internal Medicine

## 2019-01-14 ENCOUNTER — Encounter: Payer: Medicare HMO | Admitting: Gastroenterology

## 2019-02-08 ENCOUNTER — Encounter: Payer: Medicare HMO | Admitting: Gastroenterology

## 2019-02-13 NOTE — Progress Notes (Signed)
Virtual Visit via Video Note  I connected with Pam Butler on 02/14/19 at  8:30 AM EDT by a video enabled telemedicine application and verified that I am speaking with the correct person using two identifiers.   I discussed the limitations of evaluation and management by telemedicine and the availability of in person appointments. The patient expressed understanding and agreed to proceed.  The patient is currently at home and I am in the office.    No referring provider.    History of Present Illness: She is here for follow up of her chronic medical conditions.   She is not exercising regularly.    Hyperlipidemia: She is not on any medication. She is compliant with a low fat/cholesterol diet.    Prediabetes:  She is not compliant with a low sugar/carbohydrate diet.  She is not exercising regularly.  Anxiety, depression: She is taking her medication daily as prescribed. She denies any side effects from the medication. She feels her anxiety and depression are well controlled and she is happy with her current dose of medication.   Chest pain: For the past 1-2 months she has been experiencing substernal chest pressure.  She notices it mostly when washing dishes.  She states it is not always exertional.  She wonders if it is positional.  It started around the time that her right shoulder started hurting.  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  She almost did not mention the chest pain because she thought it might be related to her shoulder.  She denies palpitations or shortness of breath.  She does not feel like the chest pain is occurring more frequently.    Review of Systems  Constitutional: Negative for chills and fever.  Respiratory: Negative for cough, shortness of breath and wheezing.   Cardiovascular: Positive for chest pain (pressure in central sternum ) and leg swelling (mild,  chronic). Negative for palpitations.  Neurological: Negative for headaches.  Psychiatric/Behavioral: Positive for depression (controlled). The patient is nervous/anxious (controlled).      Social History   Socioeconomic History  . Marital status: Married    Spouse name: Not on file  . Number of children: 2  . Years of education: Not on file  . Highest education level: Not on file  Occupational History  . Not on file  Social Needs  . Financial resource strain: Not hard at all  . Food insecurity:    Worry: Never true    Inability: Never true  . Transportation needs:    Medical: No    Non-medical: No  Tobacco Use  . Smoking status: Never Smoker  . Smokeless tobacco: Never Used  Substance and Sexual Activity  . Alcohol use: No    Alcohol/week: 0.0 standard drinks  . Drug use: No  . Sexual activity: Never  Lifestyle  . Physical activity:    Days per week: 5 days    Minutes per session: 50 min  . Stress: Rather much  Relationships  . Social connections:    Talks on phone: More than three times a week    Gets together: More than three times a week    Attends religious service: More than 4 times per year    Active member of club or organization: Yes    Attends meetings of clubs or organizations: More than 4 times per year    Relationship status: Married  Other Topics Concern  . Not on file  Social History Narrative   Exercise: none     Observations/Objective: Appears well in NAD  BP 119/71   Pulse 73 Weight 182   Assessment and Plan:  See Problem List for Assessment and Plan of chronic medical problems.   Follow Up Instructions:    I discussed the assessment and treatment plan with the patient. The patient was provided an opportunity to ask questions and all were answered. The patient agreed with the plan and demonstrated an understanding of the instructions.   The patient was advised to call back or seek an in-person evaluation if the symptoms worsen or if  the condition fails to improve as anticipated.    [name], MD

## 2019-02-14 ENCOUNTER — Encounter: Payer: Self-pay | Admitting: Internal Medicine

## 2019-02-14 ENCOUNTER — Ambulatory Visit (INDEPENDENT_AMBULATORY_CARE_PROVIDER_SITE_OTHER): Payer: Medicare HMO | Admitting: Internal Medicine

## 2019-02-14 DIAGNOSIS — E7849 Other hyperlipidemia: Secondary | ICD-10-CM

## 2019-02-14 DIAGNOSIS — R0789 Other chest pain: Secondary | ICD-10-CM | POA: Diagnosis not present

## 2019-02-14 DIAGNOSIS — R7303 Prediabetes: Secondary | ICD-10-CM

## 2019-02-14 DIAGNOSIS — F329 Major depressive disorder, single episode, unspecified: Secondary | ICD-10-CM

## 2019-02-14 DIAGNOSIS — R69 Illness, unspecified: Secondary | ICD-10-CM | POA: Diagnosis not present

## 2019-02-14 DIAGNOSIS — F32A Depression, unspecified: Secondary | ICD-10-CM

## 2019-02-14 DIAGNOSIS — F419 Anxiety disorder, unspecified: Secondary | ICD-10-CM

## 2019-02-14 MED ORDER — CITALOPRAM HYDROBROMIDE 20 MG PO TABS: 20.0000 mg | ORAL_TABLET | Freq: Every day | ORAL | 3 refills | Status: DC

## 2019-02-14 NOTE — Assessment & Plan Note (Signed)
Fairly controlled, stable Continue citalopram 20 mg daily

## 2019-02-14 NOTE — Assessment & Plan Note (Signed)
Midsternum, intermittent over the past 1-2 months Started with her shoulder pain and she feels it is related to her shoulder because it seems to be more positional than exertional Discussed possible referral to cardiology-she would like to monitor for now She will start shoulder exercises at home that she has been doing for her other shoulder and will see sports medicine if needed If chest pain persists or escalates she will let me know so I can refer her to cardiology

## 2019-02-14 NOTE — Assessment & Plan Note (Signed)
Diet controlled at this point because she has not tolerated statins Stressed regular exercise Low-fat/cholesterol diet She has lost a few pounds since she was here last We will recheck her blood work at her next visit-can consider Zetia

## 2019-02-14 NOTE — Assessment & Plan Note (Signed)
Lab Results  Component Value Date   HGBA1C 5.5 07/13/2018    Encouraged low sugar/carbohydrate diet Encourage regular exercise She has lost a couple of pounds-continue weight loss efforts Will recheck A1c in 6 months

## 2019-02-16 DIAGNOSIS — R69 Illness, unspecified: Secondary | ICD-10-CM | POA: Diagnosis not present

## 2019-02-16 DIAGNOSIS — L570 Actinic keratosis: Secondary | ICD-10-CM | POA: Diagnosis not present

## 2019-02-16 DIAGNOSIS — Z85828 Personal history of other malignant neoplasm of skin: Secondary | ICD-10-CM | POA: Diagnosis not present

## 2019-02-23 ENCOUNTER — Other Ambulatory Visit: Payer: Self-pay

## 2019-02-23 ENCOUNTER — Ambulatory Visit: Payer: Medicare HMO

## 2019-02-23 VITALS — Ht 62.0 in | Wt 183.0 lb

## 2019-02-23 DIAGNOSIS — Z86010 Personal history of colonic polyps: Secondary | ICD-10-CM

## 2019-02-23 MED ORDER — NA SULFATE-K SULFATE-MG SULF 17.5-3.13-1.6 GM/177ML PO SOLN: 1.0000 | Freq: Once | ORAL | 0 refills | Status: AC

## 2019-02-23 NOTE — Progress Notes (Signed)
Per pt, no allergies to soy or egg products.Pt not taking any weight loss meds or using  O2 at home.  Pt denies any sedation problems.  Emmi inform was mailed to pt.  The PV was done over the phone due to COVID-19. Address and insurance were verified with the pt.  Paperwork and prep instructions were reviewed and will mail the paperwork to the pt today. Pt was advised to call if she has any questions or changes before her procedure. Pt understood.

## 2019-03-09 ENCOUNTER — Encounter: Payer: Medicare HMO | Admitting: Gastroenterology

## 2019-03-10 ENCOUNTER — Encounter: Payer: Self-pay | Admitting: Gastroenterology

## 2019-03-22 ENCOUNTER — Telehealth: Payer: Self-pay | Admitting: *Deleted

## 2019-03-22 NOTE — Telephone Encounter (Signed)

## 2019-03-24 ENCOUNTER — Other Ambulatory Visit: Payer: Self-pay

## 2019-03-24 ENCOUNTER — Ambulatory Visit (AMBULATORY_SURGERY_CENTER): Payer: Medicare HMO | Admitting: Gastroenterology

## 2019-03-24 ENCOUNTER — Encounter: Payer: Self-pay | Admitting: Gastroenterology

## 2019-03-24 ENCOUNTER — Encounter: Payer: Medicare HMO | Admitting: Gastroenterology

## 2019-03-24 VITALS — BP 140/73 | HR 61 | Temp 98.4°F | Resp 11 | Ht 62.0 in | Wt 183.0 lb

## 2019-03-24 DIAGNOSIS — K635 Polyp of colon: Secondary | ICD-10-CM | POA: Diagnosis not present

## 2019-03-24 DIAGNOSIS — Z86010 Personal history of colonic polyps: Secondary | ICD-10-CM | POA: Diagnosis not present

## 2019-03-24 DIAGNOSIS — D120 Benign neoplasm of cecum: Secondary | ICD-10-CM | POA: Diagnosis not present

## 2019-03-24 DIAGNOSIS — D122 Benign neoplasm of ascending colon: Secondary | ICD-10-CM

## 2019-03-24 DIAGNOSIS — Z1211 Encounter for screening for malignant neoplasm of colon: Secondary | ICD-10-CM | POA: Diagnosis not present

## 2019-03-24 MED ORDER — SODIUM CHLORIDE 0.9 % IV SOLN: 500.0000 mL | Freq: Once | INTRAVENOUS | Status: DC

## 2019-03-24 NOTE — Op Note (Signed)
[facility] Patient Name: Pam Butler Procedure Date: 03/24/2019 10:32 AM MRN: [id number] Endoscopist: Remo Lipps P. Havery Moros , MD Age: 73 Referring MD:  Date of Birth: July 21, 1946 Gender: Female Account #: [id number] Procedure:                Colonoscopy Indications:              Surveillance: Personal history of 53mm adenomatous                            polyp on last colonoscopy 3 years ago Medicines:                Monitored Anesthesia Care Procedure:                Pre-Anesthesia Assessment:                           - Prior to the procedure, a History and Physical                            was performed, and patient medications and                            allergies were reviewed. The patient's tolerance of                            previous anesthesia was also reviewed. The risks                            and benefits of the procedure and the sedation                            options and risks were discussed with the patient.                            All questions were answered, and informed consent                            was obtained. Prior Anticoagulants: The patient has                            taken no previous anticoagulant or antiplatelet                            agents. ASA Grade Assessment: II - A patient with                            mild systemic disease. After reviewing the risks                            and benefits, the patient was deemed in                            satisfactory condition to undergo the procedure.  After obtaining informed consent, the colonoscope                            was passed under direct vision. Throughout the                            procedure, the patient's blood pressure, pulse, and                            oxygen saturations were monitored continuously. The                            Model PCF-H190DL [id number]) scope was introduced                            through the anus  and advanced to the the cecum,                            identified by appendiceal orifice and ileocecal                            valve. The colonoscopy was performed without                            difficulty. The patient tolerated the procedure                            well. The quality of the bowel preparation was                            adequate. The ileocecal valve, appendiceal orifice,                            and rectum were photographed. Scope In: 10:36:27 AM Scope Out: 10:56:26 AM Scope Withdrawal Time: 0 hours 16 minutes 9 seconds  Total Procedure Duration: 0 hours 19 minutes 59 seconds  Findings:                 The perianal and digital rectal examinations were                            normal.                           A 4 mm polyp was found in the cecum. The polyp was                            flat. The polyp was removed with a cold snare.                            Resection and retrieval were complete.                           A diminutive polyp was found in the ascending  colon. The polyp was flat. The polyp was removed                            with a cold snare. Resection and retrieval were                            complete.                           The exam was otherwise without abnormality on                            direct and retroflexion views. Complications:            No immediate complications. Estimated blood loss:                            Minimal. Estimated Blood Loss:     Estimated blood loss was minimal. Impression:               - One 4 mm polyp in the cecum, removed with a cold                            snare. Resected and retrieved.                           - One diminutive polyp in the ascending colon,                            removed with a cold snare. Resected and retrieved.                           - The examination was otherwise normal on direct                            and retroflexion  views. Recommendation:           - Patient has a contact number available for                            emergencies. The signs and symptoms of potential                            delayed complications were discussed with the                            patient. Return to normal activities tomorrow.                            Written discharge instructions were provided to the                            patient.                           - Resume previous diet.                           -  Continue present medications.                           - Await pathology results. Remo Lipps P. Armbruster, MD 03/24/2019 11:00:24 AM This report has been signed electronically.

## 2019-03-24 NOTE — Progress Notes (Signed)
A and O x3. Report to RN. Tolerated MAC anesthesia well.

## 2019-03-24 NOTE — Progress Notes (Signed)
Pt's states no medical or surgical changes since previsit or office visit. 

## 2019-03-24 NOTE — Patient Instructions (Signed)
   Information on polyps given to you today.  Await pathology results on polyps removed    YOU HAD AN ENDOSCOPIC PROCEDURE TODAY AT [facility]:   Refer to the procedure report that was given to you for any specific questions about what was found during the examination.  If the procedure report does not answer your questions, please call your gastroenterologist to clarify.  If you requested that your care partner not be given the details of your procedure findings, then the procedure report has been included in a sealed envelope for you to review at your convenience later.  YOU SHOULD EXPECT: Some feelings of bloating in the abdomen. Passage of more gas than usual.  Walking can help get rid of the air that was put into your GI tract during the procedure and reduce the bloating. If you had a lower endoscopy (such as a colonoscopy or flexible sigmoidoscopy) you may notice spotting of blood in your stool or on the toilet paper. If you underwent a bowel prep for your procedure, you may not have a normal bowel movement for a few days.  Please Note:  You might notice some irritation and congestion in your nose or some drainage.  This is from the oxygen used during your procedure.  There is no need for concern and it should clear up in a day or so.  SYMPTOMS TO REPORT IMMEDIATELY:   Following lower endoscopy (colonoscopy or flexible sigmoidoscopy):  Excessive amounts of blood in the stool  Significant tenderness or worsening of abdominal pains  Swelling of the abdomen that is new, acute  Fever of 100F or higher    For urgent or emergent issues, a gastroenterologist can be reached at any hour by calling [phone].   DIET:  We do recommend a small meal at first, but then you may proceed to your regular diet.  Drink plenty of fluids but you should avoid alcoholic beverages for 24 hours.  ACTIVITY:  You should plan to take it easy for the rest of today and you should NOT  DRIVE or use heavy machinery until tomorrow (because of the sedation medicines used during the test).    FOLLOW UP: Our staff will call the number listed on your records 48-72 hours following your procedure to check on you and address any questions or concerns that you may have regarding the information given to you following your procedure. If we do not reach you, we will leave a message.  We will attempt to reach you two times.  During this call, we will ask if you have developed any symptoms of COVID 19. If you develop any symptoms (ie: fever, flu-like symptoms, shortness of breath, cough etc.) before then, please call [phone].  If you test positive for Covid 19 in the 2 weeks post procedure, please call and report this information to [place].    If any biopsies were taken you will be contacted by phone or by letter within the next 1-3 weeks.  Please call us at [phone] if you have not heard about the biopsies in 3 weeks.    SIGNATURES/CONFIDENTIALITY: You and/or your care partner have signed paperwork which will be entered into your electronic medical record.  These signatures attest to the fact that that the information above on your After Visit Summary has been reviewed and is understood.  Full responsibility of the confidentiality of this discharge information lies with you and/or your care-partner.

## 2019-03-24 NOTE — Progress Notes (Signed)
Called to room to assist during endoscopic procedure.  Patient ID and intended procedure confirmed with present staff. Received instructions for my participation in the procedure from the performing physician.  

## 2019-03-25 ENCOUNTER — Ambulatory Visit (INDEPENDENT_AMBULATORY_CARE_PROVIDER_SITE_OTHER): Payer: Medicare HMO | Admitting: *Deleted

## 2019-03-25 ENCOUNTER — Other Ambulatory Visit: Payer: Self-pay

## 2019-03-25 DIAGNOSIS — Z0000 Encounter for general adult medical examination without abnormal findings: Secondary | ICD-10-CM | POA: Diagnosis not present

## 2019-03-25 NOTE — Progress Notes (Addendum)
Subjective:   Pam Butler is a 73 y.o. female who presents for Medicare Annual (Subsequent) preventive examination.  Review of Systems:  No ROS.  Medicare Wellness Virtual Visit.  Visual/audio telehealth visit, UTA vital signs.   See social history for additional risk factors.   Sleep patterns: has interrupted sleep, gets up 1-2 times nightly to void and sleeps 6-7 hours nightly.    Home Safety/Smoke Alarms: Feels safe in home. Smoke alarms in place.  Living environment; residence and Firearm Safety: 1-story house/ trailer. 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      Objective:     Vitals: There were no vitals taken for this visit.  There is no height or weight on file to calculate BMI.  Advanced Directives 03/25/2019 03/18/2018 01/29/2016  Does Patient Have a Medical Advance Directive? No No No  Would patient like information on creating a medical advance directive? No - Patient declined Yes (ED - Information included in AVS) -    Tobacco Social History   Tobacco Use  Smoking Status Never Smoker  Smokeless Tobacco Never Used     Counseling given: Not Answered  Past Medical History:  Diagnosis Date  . Allergy   . Anxiety   . Cataract    Bil  . Depression   . History of bloody stools    in past/  . History of muscle pain    Bil pain in bil arms and shoulders/ chronic since last year/left shoulder started this year  . Loose stools    on and off/occasional blood in stool   Past Surgical History:  Procedure Laterality Date  . ABDOMINAL HYSTERECTOMY     with bladder tack  . APPENDECTOMY    . BREAST BIOPSY Right 2001   benign  . CHOLECYSTECTOMY    . COLONOSCOPY    . TUBAL LIGATION     Family History  Problem Relation Age of Onset  . Other Mother        Prediabetes  . Colitis Mother   . Pneumonia Father   . Arthritis Sister   . Heart attack Paternal  Grandmother 58  . Heart attack Maternal Uncle 67  . Colon cancer Neg Hx    Social History   Socioeconomic History  . Marital status: Married    Spouse name: Not on file  . Number of children: 2  . Years of education: Not on file  . Highest education level: Not on file  Occupational History  . Occupation: retired  Scientific laboratory technician  . Financial resource strain: Not hard at all  . Food insecurity    Worry: Never true    Inability: Never true  . Transportation needs    Medical: No    Non-medical: No  Tobacco Use  . Smoking status: Never Smoker  . Smokeless tobacco: Never Used  Substance and Sexual Activity  . Alcohol use: No    Alcohol/week: 0.0 standard drinks  . Drug use: No  . Sexual activity: Never  Lifestyle  . Physical activity    Days per week: 0 days    Minutes per session: 0 min  . Stress: Rather much  Relationships  . Social connections    Talks on phone: More than three times a week    Gets together: More than three times a week    Attends religious service: More than 4 times per year  Active member of club or organization: Yes    Attends meetings of clubs or organizations: More than 4 times per year    Relationship status: Married  Other Topics Concern  . Not on file  Social History Narrative   Exercise: none    Outpatient Encounter Medications as of 03/25/2019  Medication Sig  . Acetaminophen (TYLENOL EXTRA STRENGTH PO) Take by mouth. Take one prn  . aspirin EC 325 MG tablet Take 325 mg by mouth as needed.  . Calcium Carb-Cholecalciferol (CALCIUM 600 + D PO) Take by mouth daily.  . calcium carbonate (TUMS - DOSED IN MG ELEMENTAL CALCIUM) 500 MG chewable tablet Chew 1 tablet by mouth as needed for indigestion or heartburn.  . cetirizine (ZYRTEC) 10 MG tablet TAKE 1 TABLET BY MOUTH EVERY DAY  . citalopram (CELEXA) 20 MG tablet Take 1 tablet (20 mg total) by mouth daily.  . Melatonin 10 MG CAPS Take by mouth as needed.  . Multiple Vitamins-Minerals (WOMENS  MULTI VITAMIN & MINERAL PO) Take by mouth. Centrum Womens MVI-Take one daily  . [DISCONTINUED] Cholecalciferol (D3-1000 PO) Take by mouth. Take 1 pill daily   No facility-administered encounter medications on file as of 03/25/2019.     Activities of Daily Living In your present state of health, do you have any difficulty performing the following activities: 03/25/2019  Hearing? N  Vision? N  Difficulty concentrating or making decisions? N  Walking or climbing stairs? N  Dressing or bathing? N  Doing errands, shopping? N  Preparing Food and eating ? N  Using the Toilet? N  In the past six months, have you accidently leaked urine? N  Do you have problems with loss of bowel control? N  Managing your Medications? N  Managing your Finances? N  Housekeeping or managing your Housekeeping? N  Some recent data might be hidden    Patient Care Team: [name], MD as PCP - General (Internal Medicine)    Assessment:   This is a routine wellness examination for Pam Butler. Physical assessment deferred to PCP.  Exercise Activities and Dietary recommendations Current Exercise Habits: The patient does not participate in regular exercise at present, Exercise limited by: orthopedic condition(s)  Diet (meal preparation, eat out, water intake, caffeinated beverages, dairy products, fruits and vegetables): in general, a "healthy" diet  , well balanced   Reviewed heart healthy diet. Encouraged patient to increase daily water and healthy fluid intake.  Goals    . Patient Stated     I want to start to take time for myself. I will talk to my sister and discuss her staying with mother more often.       Fall Risk Fall Risk  03/25/2019 03/18/2018 11/12/2016 03/10/2013  Falls in the past year? 0 Yes No No  Number falls in past yr: 0 1 - -  Injury with Fall? - No - -  Follow up - Education provided;Falls prevention discussed - -    Depression Screen PHQ 2/9 Scores 03/25/2019 07/13/2018 03/18/2018 11/12/2016   PHQ - 2 Score 1 4 2 1   PHQ- 9 Score 3 18 7  -     Cognitive Function       Ad8 score reviewed for issues:  Issues making decisions: no  Less interest in hobbies / activities: no  Repeats questions, stories (family complaining): no  Trouble using ordinary gadgets (microwave, computer, phone):no  Forgets the month or year: no  Mismanaging finances: no  Remembering appts: no  Daily problems with  thinking and/or memory: no Ad8 score is= 0  Immunization History  Administered Date(s) Administered  . Influenza Whole 07/06/2008, 07/19/2010  . Influenza, High Dose Seasonal PF 07/13/2018  . Influenza,inj,Quad PF,6+ Mos 08/09/2016  . Pneumococcal Conjugate-13 11/12/2016  . Pneumococcal Polysaccharide-23 01/11/2018  . Td 06/30/2005   Screening Tests Health Maintenance  Topic Date Due  . TETANUS/TDAP  07/01/2015  . INFLUENZA VACCINE  05/14/2019  . MAMMOGRAM  12/16/2020  . DEXA SCAN  02/17/2021  . COLONOSCOPY  03/23/2022  . Hepatitis C Screening  Completed  . PNA vac Low Risk Adult  Completed      Plan:     Reviewed health maintenance screenings with patient today and relevant education, vaccines, and/or referrals were provided.   Continue to eat heart healthy diet (full of fruits, vegetables, whole grains, lean protein, water--limit salt, fat, and sugar intake) and increase physical activity as tolerated.  Continue doing brain stimulating activities (puzzles, reading, adult coloring books, staying active) to keep memory sharp.   I have personally reviewed and noted the following in the patient's chart:   . Medical and social history . Use of alcohol, tobacco or illicit drugs  . Current medications and supplements . Functional ability and status . Nutritional status . Physical activity . Advanced directives . List of other physicians . Vitals . Screenings to include cognitive, depression, and falls . Referrals and appointments  In addition, I have reviewed and  discussed with patient certain preventive protocols, quality metrics, and best practice recommendations. A written personalized care plan for preventive services as well as general preventive health recommendations were provided to patient.     Michiel Cowboy, RN  03/25/2019   Medical screening examination/treatment/procedure(s) were performed by non-physician practitioner and as supervising physician I was immediately available for consultation/collaboration. I agree with above. [name], MD

## 2019-03-28 ENCOUNTER — Telehealth: Payer: Self-pay

## 2019-03-28 NOTE — Telephone Encounter (Signed)
  Follow up Call-  Call back number 03/24/2019  Post procedure Call Back phone  # [phone]  Permission to leave phone message Yes  Some recent data might be hidden     Patient questions:  Do you have a fever, pain , or abdominal swelling? No. Pain Score  0 *  Have you tolerated food without any problems? Yes.    Have you been able to return to your normal activities? Yes.    Do you have any questions about your discharge instructions: Diet   No. Medications  No. Follow up visit  No.  Do you have questions or concerns about your Care? No.  Actions: * If pain score is 4 or above: No action needed, pain <4.  1. Have you developed a fever since your procedure? no  2.   Have you had an respiratory symptoms (SOB or cough) since your procedure? no  3.   Have you tested positive for COVID 19 since your procedure no  4.   Have you had any family members/close contacts diagnosed with the COVID 19 since your procedure?  No    If yes to any of these questions please route to Joylene John, RN and Alphonsa Gin, RN.

## 2019-03-31 ENCOUNTER — Encounter: Payer: Self-pay | Admitting: Gastroenterology

## 2019-04-11 ENCOUNTER — Encounter: Payer: Self-pay | Admitting: Internal Medicine

## 2019-04-11 ENCOUNTER — Ambulatory Visit (INDEPENDENT_AMBULATORY_CARE_PROVIDER_SITE_OTHER): Payer: Medicare HMO | Admitting: Internal Medicine

## 2019-04-11 ENCOUNTER — Ambulatory Visit: Payer: Self-pay

## 2019-04-11 ENCOUNTER — Other Ambulatory Visit: Payer: Self-pay

## 2019-04-11 ENCOUNTER — Other Ambulatory Visit: Payer: Self-pay | Admitting: Internal Medicine

## 2019-04-11 ENCOUNTER — Other Ambulatory Visit (INDEPENDENT_AMBULATORY_CARE_PROVIDER_SITE_OTHER): Payer: Medicare HMO

## 2019-04-11 ENCOUNTER — Ambulatory Visit (HOSPITAL_COMMUNITY)
Admission: RE | Admit: 2019-04-11 | Discharge: 2019-04-11 | Disposition: A | Discharge status: Home / Self Care | Payer: Medicare HMO | Source: Ambulatory Visit | Attending: Internal Medicine | Admitting: Internal Medicine

## 2019-04-11 VITALS — BP 146/72 | HR 73 | Temp 98.3°F | Resp 18 | Ht 62.0 in | Wt 183.8 lb

## 2019-04-11 DIAGNOSIS — I809 Phlebitis and thrombophlebitis of unspecified site: Secondary | ICD-10-CM

## 2019-04-11 DIAGNOSIS — E7849 Other hyperlipidemia: Secondary | ICD-10-CM

## 2019-04-11 DIAGNOSIS — I82890 Acute embolism and thrombosis of other specified veins: Secondary | ICD-10-CM | POA: Insufficient documentation

## 2019-04-11 LAB — COMPREHENSIVE METABOLIC PANEL
ALT: 14 U/L (ref 0–35)
AST: 16 U/L (ref 0–37)
Albumin: 4.6 g/dL (ref 3.5–5.2)
Alkaline Phosphatase: 62 U/L (ref 39–117)
BUN: 17 mg/dL (ref 6–23)
CO2: 25 mEq/L (ref 19–32)
Calcium: 9.5 mg/dL (ref 8.4–10.5)
Chloride: 102 mEq/L (ref 96–112)
Creatinine, Ser: 0.96 mg/dL (ref 0.40–1.20)
GFR: 56.8 mL/min — ABNORMAL LOW (ref >60.00)
Glucose, Bld: 90 mg/dL (ref 70–99)
Potassium: 3.8 mEq/L (ref 3.5–5.1)
Sodium: 135 mEq/L (ref 135–145)
Total Bilirubin: 0.4 mg/dL (ref 0.2–1.2)
Total Protein: 7.8 g/dL (ref 6.0–8.3)

## 2019-04-11 LAB — LIPID PANEL
Cholesterol: 291 mg/dL — ABNORMAL HIGH (ref 0–200)
HDL: 38 mg/dL — ABNORMAL LOW (ref >39.00)
Total CHOL/HDL Ratio: 8
Triglycerides: 505 mg/dL — ABNORMAL HIGH (ref 0.0–149.0)

## 2019-04-11 LAB — LDL CHOLESTEROL, DIRECT: Direct LDL: 156 mg/dL

## 2019-04-11 MED ORDER — EZETIMIBE 10 MG PO TABS: 10.0000 mg | ORAL_TABLET | Freq: Every day | ORAL | 1 refills | Status: DC

## 2019-04-11 NOTE — Telephone Encounter (Signed)
Appointment made with Dr.Burns for today 6/29.

## 2019-04-11 NOTE — Assessment & Plan Note (Signed)
Symptoms c/w superficial thrombophlebitis We will get a lower extremity ultrasound to confirm and rule out DVT Treatment based on ultrasound results, but most likely will involve NSAIDs, compresses, elevation

## 2019-04-11 NOTE — Progress Notes (Signed)
Subjective:    Patient ID: Pam Butler, female    DOB: [birth year], 73 y.o.   MRN: [id number]  HPI The patient is here for an acute visit.  Right leg pain and swelling: She noticed swelling and pain in her right lower leg 3 days ago.  It only hurts when she pushes on it or bumps it.  She denies pain with walking and at rest.  She denies redness.  The swelling is slightly worse - it appears larger in diameter than it was three days ago.  She denies redness, fever or chills.  She denies injuries to the leg, long card rides and new activities.    Hyperlipidemia: She is not currently on any medication and has not tolerated statins in the past.  We discussed possibly starting Zetia at her last visit she would like to consider that.  She states her lifestyle has not changed much and she needs to do something for cholesterol.  She would like to have her cholesterol checked now to see what it is as well.  Medications and allergies reviewed with patient and updated if appropriate.  Patient Active Problem List   Diagnosis Date Noted  . Chest pressure 02/14/2019  . 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 Obesity (BMI 30.0-34.9) 07/13/2018  . Osteopenia 02/18/2018  . Prediabetes 11/12/2016  . Anxiety 03/08/2010  . IBS 03/08/2010  . Hyperlipidemia 05/05/2008  . Depression 05/05/2008  . GERD 05/05/2008    Current Outpatient Medications on File Prior to Visit  Medication Sig Dispense Refill  . Acetaminophen (TYLENOL EXTRA STRENGTH PO) Take by mouth. Take one prn    . aspirin EC 325 MG tablet Take 325 mg by mouth as needed.    . Calcium Carb-Cholecalciferol (CALCIUM 600 + D PO) Take by mouth daily.    . calcium carbonate (TUMS - DOSED IN MG ELEMENTAL CALCIUM) 500 MG chewable tablet Chew 1 tablet by mouth as needed for indigestion or heartburn.    . cetirizine (ZYRTEC) 10 MG tablet TAKE 1 TABLET BY MOUTH EVERY DAY 90 tablet 1  . citalopram (CELEXA) 20 MG tablet Take 1 tablet (20 mg total) by mouth  daily. 90 tablet 3  . Melatonin 10 MG CAPS Take by mouth as needed.    . Multiple Vitamins-Minerals (WOMENS MULTI VITAMIN & MINERAL PO) Take by mouth. Centrum Womens MVI-Take one daily     No current facility-administered medications on file prior to visit.     Past Medical History:  Diagnosis Date  . Allergy   . Anxiety   . Cataract    Bil  . Depression   . History of bloody stools    in past/  . History of muscle pain    Bil pain in bil arms and shoulders/ chronic since last year/left shoulder started this year  . Loose stools    on and off/occasional blood in stool    Past Surgical History:  Procedure Laterality Date  . ABDOMINAL HYSTERECTOMY     with bladder tack  . APPENDECTOMY    . BREAST BIOPSY Right 2001   benign  . CHOLECYSTECTOMY    . COLONOSCOPY    . TUBAL LIGATION      Social History   Socioeconomic History  . Marital status: Married    Spouse name: Not on file  . Number of children: 2  . Years of education: Not on file  . Highest education level: Not on file  Occupational History  . Occupation: retired  Social Needs  . Financial resource strain: Not hard at all  . Food insecurity    Worry: Never true    Inability: Never true  . Transportation needs    Medical: No    Non-medical: No  Tobacco Use  . Smoking status: Never Smoker  . Smokeless tobacco: Never Used  Substance and Sexual Activity  . Alcohol use: No    Alcohol/week: 0.0 standard drinks  . Drug use: No  . Sexual activity: Never  Lifestyle  . Physical activity    Days per week: 0 days    Minutes per session: 0 min  . Stress: Rather much  Relationships  . Social connections    Talks on phone: More than three times a week    Gets together: More than three times a week    Attends religious service: More than 4 times per year    Active member of club or organization: Yes    Attends meetings of clubs or organizations: More than 4 times per year    Relationship status: Married   Other Topics Concern  . Not on file  Social History Narrative   Exercise: none    Family History  Problem Relation Age of Onset  . Other Mother        Prediabetes  . Colitis Mother   . Pneumonia Father   . Arthritis Sister   . Heart attack Paternal Grandmother 20  . Heart attack Maternal Uncle 67  . Colon cancer Neg Hx     Review of Systems  Constitutional: Negative for chills and fever.  Respiratory: Negative for cough, shortness of breath and wheezing.   Cardiovascular: Positive for leg swelling (chronic - no change). Negative for chest pain and palpitations.  Skin: Negative for color change and wound.       Objective:   Vitals:   04/11/19 1315  BP: (!) 146/72  Pulse: 73  Resp: 18  Temp: 98.3 F (36.8 C)  SpO2: 98%   BP Readings from Last 3 Encounters:  04/11/19 (!) 146/72  03/24/19 140/73  07/28/18 132/90   Wt Readings from Last 3 Encounters:  04/11/19 183 lb 12.8 oz (83.4 kg)  03/24/19 183 lb (83 kg)  02/23/19 183 lb (83 kg)   Body mass index is 33.62 kg/m.   Physical Exam Constitutional:      General: She is not in acute distress.    Appearance: Normal appearance. She is not ill-appearing.  HENT:     Head: Normocephalic and atraumatic.  Cardiovascular:     Comments: Tender, firm varicose veins right mid lower leg - medial-posterior aspect Musculoskeletal:     Right lower leg: No edema.     Left lower leg: No edema.  Skin:    General: Skin is warm and dry.     Findings: Erythema (mild erythema right lower medial mid leg associated with slight warmth) present.  Neurological:     Mental Status: She is alert.            Assessment & Plan:    See Problem List for Assessment and Plan of chronic medical problems.

## 2019-04-11 NOTE — Telephone Encounter (Signed)
US shows superficial blood clot    Start diclofenac twice daily x 2 weeks ( pending) and stop aspirin 81 mg for now ---  then restart 81 mg Aspirin daily   Warm or cold compresses  Elevate leg  Can wear compression sock   Call if no improvement

## 2019-04-11 NOTE — Patient Instructions (Addendum)
Have the ultrasound today.  We will call you with the results.    Have blood work today.  Start taking zetia.     Thrombophlebitis Thrombophlebitis is a condition in which a blood clot forms in a vein. This can happen in your arms or legs, or in the area between your neck and groin (torso). When this condition happens in a vein that is close to the surface of the body (superficial thrombophlebitis), it is usually not serious.However, when the condition happens in a vein that is deep inside the body (deep vein thrombosis, DVT), it can cause serious problems. What are the causes? This condition may be caused by:  Damage to a vein.  Inflammation of the veins.  A condition that causes blood to clot more easily.  Reduced blood flow through the veins. What increases the risk? The following factors may make you more likely to develop this condition:  Having a condition that makes blood thicker or more likely to clot.  Having an infection.  Having major surgery.  Experiencing a traumatic injury or a broken bone.  Having a catheter in a vein (central line).  Having a condition in which valves in the veins do not work properly, causing blood to collect (pool) in the veins (chronic venous insufficiency).  An inactive (sedentary) lifestyle.  Pregnancy or having recently given birth.  Cancer.  Older age, especially being 78 or older.  Obesity.  Smoking.  Taking medicines that contain estrogen, such as birth control pills.  Having varicose veins.  Using drugs that are injected into the veins (intravenous, IV). What are the signs or symptoms? The main symptoms of this condition are:  Swelling and pain in an arm or leg. If the affected vein is in the leg, you may feel pain while standing or walking.  Warmth or redness in an arm or leg. Other symptoms include:  Low-grade fever.  Muscle aches.  A bulging vein (venous distension). In some cases, there are no symptoms. How  is this diagnosed? This condition may be diagnosed based on:  Your symptoms and medical history.  A physical exam.  Tests, such as: ? Blood tests. ? A test that uses sound waves to make images (ultrasound). How is this treated? Treatment depends on how severe the condition is and which area of the body is affected. Treatment may include:  Applying a warm compress or heating pad to affected areas.  Wearing compression stockings to help prevent blood clots and reduce swelling in your legs.  Raising (elevating) the affected arm or leg above the level of your heart.  Medicines, such as: ? Anti-inflammatory medicines, such as ibuprofen. ? Blood thinners (anticoagulants), such as heparin. ? Antibiotic medicine, if you have an infection.  Removing an IV that may be causing the problem. In rare cases, surgery may be needed to:  Remove a damaged section of a vein.  Place a filter in a large vein to catch blood clots before they reach the lungs. Follow these instructions at home: Medicines  Take over-the-counter and prescription medicines only as told by your health care provider.  If you were prescribed an antibiotic, take it as told by your health care provider. Do not stop using the antibiotic even if you feel better. Managing pain, stiffness, and swelling   If directed, put heat on the affected area as often as told by your health care provider. Use the heat source that your health care provider recommends, such as a moist heat pack  or a heating pad. ? Place a towel between your skin and the heat source. ? Leave the heat on for 20-30 minutes. ? Remove the heat if your skin turns bright red. This is especially important if you are not able to feel pain, heat, or cold. You may have a greater risk of getting burned.  Elevate the affected area above the level of your heart while you are sitting or lying down. Activity  Return to your normal activities as told by your health care  provider. Ask your health care provider what activities are safe for you.  Avoid sitting or lying down for long periods. If possible, stand up and walk around regularly. If you are taking blood thinners:  Take your medicine exactly as told, at the same time every day.  Avoid activities that could cause injury or bruising, and follow instructions about how to prevent falls.  Wear a medical alert bracelet or carry a card that lists what medicines you take. General instructions  Drink enough fluid to keep your urine pale yellow.  Wear compression stockings as told by your health care provider.  Do not use any products that contain nicotine or tobacco, such as cigarettes and e-cigarettes. If you need help quitting, ask your health care provider.  Keep all follow-up visits as told by your health care provider. This is important. Contact a health care provider if:  You miss a dose of your blood thinner, if applicable.  Your symptoms do not improve.  You have unusual bruising.  You have nausea, vomiting, or diarrhea that lasts for more than one day. Get help right away if:  You have any of these problems: ? New or worse pain, swelling, or redness in an arm or leg. ? Numbness or tingling in an arm or leg. ? Shortness of breath. ? Chest pain. ? Severe pain in your abdomen. ? Fast breathing. ? A fast or irregular heartbeat. ? Blood in your vomit, stool, or urine. ? A severe headache or confusion. ? A cut that does not stop bleeding.  You feel light-headed or dizzy.  You cough up blood.  You have a serious fall or accident, or you hit your head. These symptoms may represent a serious problem that is an emergency. Do not wait to see if the symptoms will go away. Get medical help right away. Call your local emergency services (911 in the [place] Do not drive yourself to the hospital. Summary  Thrombophlebitis is a condition in which a blood clot forms in a vein. This can happen in  a vein close to the surface of the body or a vein deep inside the body.  This condition can cause serious problems when it happens in a vein deep inside the body (deep vein thrombosis, DVT).  The main symptom of this condition is swelling and pain around the affected vein.  Treatment may include warm compresses, anti-inflammatory medicines, or blood thinners. This information is not intended to replace advice given to you by your health care provider. Make sure you discuss any questions you have with your health care provider. Document Released: 03/25/2017 Document Revised: 01/19/2019 Document Reviewed: 03/25/2017 Elsevier Patient Education  2020 [employer].

## 2019-04-11 NOTE — Assessment & Plan Note (Signed)
Statin intolerant Check lipid panel, CMP She is willing to try Zetia Recheck lipid panel, hepatic function 4-6 weeks after starting Zetia

## 2019-04-11 NOTE — Telephone Encounter (Signed)
Returned call to patient. Patient states that Friday evening a large 2" or grater lump came up on the inside of her right leg between her knee and ankle. She states that the area my look a little darker in color to the rest of her leg but not red.  She states that is is very painful to touch. It is a 2-3 with gentle touch.  She sees no evidence of a bug bite. She has no other symptoms. Care advice read to patient. Patient verbalized understanding of instructions. Call transferred to office for scheduling.  Reason for Disposition . [1] Thigh, calf, or ankle swelling AND [2] only 1 side  Answer Assessment - Initial Assessment Questions 1. ONSET: "When did the swelling start?" (e.g., minutes, hours, days)     Friday night 2. LOCATION: "What part of the leg is swollen?"  "Are both legs swollen or just one leg?"     Inside rt leg lower about half way down to ankle 3. SEVERITY: "How bad is the swelling?" (e.g., localized; mild, moderate, severe)  - Localized - small area of swelling localized to one leg  - MILD pedal edema - swelling limited to foot and ankle, pitting edema < 1/4 inch (6 mm) deep, rest and elevation eliminate most or all swelling  - MODERATE edema - swelling of lower leg to knee, pitting edema > 1/4 inch (6 mm) deep, rest and elevation only partially reduce swelling  - SEVERE edema - swelling extends above knee, facial or hand swelling present     2''or more 4. REDNESS: "Does the swelling look red or infected?"     Darker in area 5. PAIN: "Is the swelling painful to touch?" If so, ask: "How painful is it?"   (Scale 1-10; mild, moderate or severe)     2-3 with light touch 6. FEVER: "Do you have a fever?" If so, ask: "What is it, how was it measured, and when did it start?"      No 7. CAUSE: "What do you think is causing the leg swelling?"     unsure 8. MEDICAL HISTORY: "Do you have a history of heart failure, kidney disease, liver failure, or cancer?"     no 9. RECURRENT SYMPTOM:  "Have you had leg swelling before?" If so, ask: "When was the last time?" "What happened that time?"     Friday night 10. OTHER SYMPTOMS: "Do you have any other symptoms?" (e.g., chest pain, difficulty breathing)      no 11. PREGNANCY: "Is there any chance you are pregnant?" "When was your last menstrual period?"       N/A  Protocols used: LEG SWELLING AND EDEMA-A-AH

## 2019-04-11 NOTE — Progress Notes (Signed)
Lower extremity venous has been completed.   Preliminary results in CV Proc.   Abram Sander 04/11/2019 2:18 PM

## 2019-04-12 MED ORDER — DICLOFENAC SODIUM 75 MG PO TBEC: 75.0000 mg | DELAYED_RELEASE_TABLET | Freq: Two times a day (BID) | ORAL | 0 refills | Status: DC

## 2019-04-14 ENCOUNTER — Other Ambulatory Visit: Payer: Self-pay | Admitting: Internal Medicine

## 2019-07-13 DIAGNOSIS — R69 Illness, unspecified: Secondary | ICD-10-CM | POA: Diagnosis not present

## 2019-08-18 DIAGNOSIS — L814 Other melanin hyperpigmentation: Secondary | ICD-10-CM | POA: Diagnosis not present

## 2019-08-18 DIAGNOSIS — L820 Inflamed seborrheic keratosis: Secondary | ICD-10-CM | POA: Diagnosis not present

## 2019-08-18 DIAGNOSIS — L438 Other lichen planus: Secondary | ICD-10-CM | POA: Diagnosis not present

## 2019-08-18 DIAGNOSIS — D225 Melanocytic nevi of trunk: Secondary | ICD-10-CM | POA: Diagnosis not present

## 2019-08-22 NOTE — Patient Instructions (Addendum)
  Tests ordered today. Your results will be released to [place] (or called to you) after review.  If any changes need to be made, you will be notified at that same time.  Flu immunization administered today.    Medications reviewed and updated.  Changes include :   None    Please followup in 6 months

## 2019-08-22 NOTE — Progress Notes (Signed)
Subjective:    Patient ID: Pam Butler, female    DOB: 10/09/1946, 73 y.o.   MRN: [id number]  HPI The patient is here for follow up.  She is not exercising regularly.     Prediabetes:  She is compliant with a low sugar/carbohydrate diet.  She is not exercising regularly.  Hyperlipidemia: She is taking her medication daily. She is compliant with a low fat/cholesterol diet. She denies myalgias.   Anxiety, Depression: she has increased stress with taking care of her mother.  She is taking her medication daily as prescribed. She denies any side effects from the medication. She feels her depression and anxiety are well controlled and she is happy with her current dose of medication. She does feel overwhelmed recently.     She has b/l shoulder pain and left wrist pain.  She has seen Dr Tamala Julian and thinks she needs to go back.     Medications and allergies reviewed with patient and updated if appropriate.  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 Take one prn     aspirin EC 325 MG tablet Take 325 mg by mouth as needed.     Calcium Carb-Cholecalciferol (CALCIUM 600 + D PO) Take by mouth daily.     calcium carbonate (TUMS - DOSED IN MG ELEMENTAL CALCIUM) 500 MG chewable tablet Chew 1 tablet by mouth as needed for indigestion or heartburn.     cetirizine (ZYRTEC) 10 MG tablet TAKE 1 TABLET BY MOUTH EVERY DAY 90 tablet 1   citalopram (CELEXA) 20 MG tablet Take 1 tablet (20 mg total) by mouth daily. 90 tablet 3   ezetimibe  (ZETIA) 10 MG tablet Take 1 tablet (10 mg total) by mouth daily. 90 tablet 1   Melatonin 10 MG CAPS Take by mouth as needed.     Multiple Vitamins-Minerals (WOMENS MULTI VITAMIN & MINERAL PO) Take by mouth. Centrum Womens MVI-Take one daily     No current facility-administered medications on file prior to visit.     Past Medical History:  Diagnosis Date   Allergy    Anxiety    Cataract    Bil   Depression    History of bloody stools    in past/   History of muscle pain    Bil pain in bil arms and shoulders/ chronic since last year/left shoulder started this year   Loose stools    on and off/occasional blood in stool    Past Surgical History:  Procedure Laterality Date   ABDOMINAL HYSTERECTOMY     with bladder tack   APPENDECTOMY     BREAST BIOPSY Right 2001   benign   CHOLECYSTECTOMY     COLONOSCOPY     TUBAL LIGATION      Social History   Socioeconomic History   Marital status: Married    Spouse name: Not on file   Number of children: 2   Years of education: Not on file   Highest education level: Not on file  Occupational History   Occupation: retired  Social Designer, fashion/clothing strain: Not hard at all   Food insecurity    Worry: Never true    Inability: Never true   Transportation needs    Medical: No    Non-medical: No  Tobacco Use   Smoking status: Never Smoker   Smokeless tobacco: Never Used  Substance and Sexual Activity   Alcohol use: No    Alcohol/week: 0.0 standard drinks   Drug use: No   Sexual activity: Never  Lifestyle   Physical activity    Days per week: 0 days    Minutes per session: 0 min   Stress: Rather much  Relationships   Social connections    Talks on phone: More than three times a week    Gets together: More than three times a week    Attends religious service: More than 4 times per year    Active member of club or organization: Yes    Attends meetings of clubs or organizations: More  than 4 times per year    Relationship status: Married  Other Topics Concern   Not on file  Social History Narrative   Exercise: none    Family History  Problem Relation Age of Onset   Other Mother        Prediabetes   Colitis Mother    Pneumonia Father    Arthritis Sister    Heart attack Paternal Grandmother 47   Heart attack Maternal Uncle 32   Colon cancer Neg Hx     Review of Systems  Constitutional: Negative for chills and fever.  Respiratory: Positive for shortness of breath (some with exertion - ? worse). Negative for cough and wheezing.   Cardiovascular: Positive for leg swelling (mild). Negative for chest pain and palpitations.  Neurological: Negative for light-headedness and headaches.  Psychiatric/Behavioral: Positive for dysphoric mood. The patient is nervous/anxious.        Objective:   Vitals:   08/23/19 1322  BP: 122/70  Pulse: 70  Resp: 16  Temp: 98.2 F (36.8 C)  SpO2: 99%   BP Readings from Last 3 Encounters:  08/23/19 122/70  04/11/19 (!) 146/72  03/24/19 140/73   Wt Readings from Last 3 Encounters:  08/23/19 174 lb 12.8 oz (79.3 kg)  04/11/19 183 lb 12.8 oz (83.4 kg)  03/24/19 183 lb (83 kg)   Body mass index is 31.97 kg/m.   Physical Exam    Constitutional: Appears well-developed and well-nourished. No distress.  HENT:  Head: Normocephalic and atraumatic.  Neck: Neck supple. No tracheal deviation present. No thyromegaly present.  No cervical lymphadenopathy Cardiovascular: Normal rate, regular rhythm and normal heart sounds.   No murmur heard. No carotid bruit .  No edema Pulmonary/Chest: Effort normal and breath sounds normal. No respiratory distress. No has no wheezes. No rales.  Skin: Skin is warm and dry. Not diaphoretic.  Psychiatric: Normal mood and affect. Behavior is normal.      Assessment & Plan:    See Problem List for Assessment and Plan of chronic medical problems.

## 2019-08-23 ENCOUNTER — Other Ambulatory Visit: Payer: Self-pay

## 2019-08-23 ENCOUNTER — Encounter: Payer: Self-pay | Admitting: Internal Medicine

## 2019-08-23 ENCOUNTER — Ambulatory Visit (INDEPENDENT_AMBULATORY_CARE_PROVIDER_SITE_OTHER): Payer: Medicare HMO | Admitting: Internal Medicine

## 2019-08-23 ENCOUNTER — Other Ambulatory Visit (INDEPENDENT_AMBULATORY_CARE_PROVIDER_SITE_OTHER): Payer: Medicare HMO

## 2019-08-23 VITALS — BP 122/70 | HR 70 | Temp 98.2°F | Resp 16 | Ht 62.0 in | Wt 174.8 lb

## 2019-08-23 DIAGNOSIS — R7303 Prediabetes: Secondary | ICD-10-CM | POA: Diagnosis not present

## 2019-08-23 DIAGNOSIS — Z23 Encounter for immunization: Secondary | ICD-10-CM | POA: Diagnosis not present

## 2019-08-23 DIAGNOSIS — I809 Phlebitis and thrombophlebitis of unspecified site: Secondary | ICD-10-CM

## 2019-08-23 DIAGNOSIS — F419 Anxiety disorder, unspecified: Secondary | ICD-10-CM | POA: Diagnosis not present

## 2019-08-23 DIAGNOSIS — F32A Depression, unspecified: Secondary | ICD-10-CM

## 2019-08-23 DIAGNOSIS — R69 Illness, unspecified: Secondary | ICD-10-CM | POA: Diagnosis not present

## 2019-08-23 DIAGNOSIS — E7849 Other hyperlipidemia: Secondary | ICD-10-CM

## 2019-08-23 DIAGNOSIS — F329 Major depressive disorder, single episode, unspecified: Secondary | ICD-10-CM | POA: Diagnosis not present

## 2019-08-23 LAB — CBC WITH DIFFERENTIAL/PLATELET
Basophils Absolute: 0.1 10*3/uL (ref 0.0–0.1)
Basophils Relative: 1.1 % (ref 0.0–3.0)
Eosinophils Absolute: 0.2 10*3/uL (ref 0.0–0.7)
Eosinophils Relative: 2.2 % (ref 0.0–5.0)
HCT: 36.3 % (ref 36.0–46.0)
Hemoglobin: 12.4 g/dL (ref 12.0–15.0)
Lymphocytes Relative: 31.7 % (ref 12.0–46.0)
Lymphs Abs: 2.9 10*3/uL (ref 0.7–4.0)
MCHC: 34.3 g/dL (ref 30.0–36.0)
MCV: 88.1 fl (ref 78.0–100.0)
Monocytes Absolute: 0.8 10*3/uL (ref 0.1–1.0)
Monocytes Relative: 8.2 % (ref 3.0–12.0)
Neutro Abs: 5.2 10*3/uL (ref 1.4–7.7)
Neutrophils Relative %: 56.8 % (ref 43.0–77.0)
Platelets: 292 10*3/uL (ref 150.0–400.0)
RBC: 4.12 Mil/uL (ref 3.87–5.11)
RDW: 13.2 % (ref 11.5–15.5)
WBC: 9.2 10*3/uL (ref 4.0–10.5)

## 2019-08-23 LAB — COMPREHENSIVE METABOLIC PANEL
ALT: 16 U/L (ref 0–35)
AST: 18 U/L (ref 0–37)
Albumin: 4.7 g/dL (ref 3.5–5.2)
Alkaline Phosphatase: 64 U/L (ref 39–117)
BUN: 23 mg/dL (ref 6–23)
CO2: 24 mEq/L (ref 19–32)
Calcium: 10.2 mg/dL (ref 8.4–10.5)
Chloride: 104 mEq/L (ref 96–112)
Creatinine, Ser: 0.93 mg/dL (ref 0.40–1.20)
GFR: 58.86 mL/min — ABNORMAL LOW (ref >60.00)
Glucose, Bld: 95 mg/dL (ref 70–99)
Potassium: 4.2 mEq/L (ref 3.5–5.1)
Sodium: 137 mEq/L (ref 135–145)
Total Bilirubin: 0.4 mg/dL (ref 0.2–1.2)
Total Protein: 7.8 g/dL (ref 6.0–8.3)

## 2019-08-23 LAB — LIPID PANEL
Cholesterol: 248 mg/dL — ABNORMAL HIGH (ref 0–200)
HDL: 39 mg/dL — ABNORMAL LOW (ref >39.00)
NonHDL: 208.57
Total CHOL/HDL Ratio: 6
Triglycerides: 398 mg/dL — ABNORMAL HIGH (ref 0.0–149.0)
VLDL: 79.6 mg/dL — ABNORMAL HIGH (ref 0.0–40.0)

## 2019-08-23 LAB — TSH: TSH: 2.92 u[IU]/mL (ref 0.35–4.50)

## 2019-08-23 LAB — HEMOGLOBIN A1C: Hgb A1c MFr Bld: 5.3 % (ref 4.6–6.5)

## 2019-08-23 LAB — LDL CHOLESTEROL, DIRECT: Direct LDL: 140 mg/dL

## 2019-08-23 NOTE — Assessment & Plan Note (Signed)
Controlled, stable Continue current dose of medication- celexa 20 mg daily

## 2019-08-23 NOTE — Addendum Note (Signed)
Addended by: Delice Bison E on: 08/23/2019 02:22 PM   Modules accepted: Orders

## 2019-08-23 NOTE — Assessment & Plan Note (Addendum)
Check lipid panel  Continue daily zetia Regular exercise and healthy diet encouraged  

## 2019-08-23 NOTE — Assessment & Plan Note (Signed)
Check a1c Low sugar / carb diet Stressed regular exercise   

## 2019-08-24 ENCOUNTER — Other Ambulatory Visit: Payer: Self-pay | Admitting: Internal Medicine

## 2019-09-29 ENCOUNTER — Other Ambulatory Visit: Payer: Self-pay | Admitting: Internal Medicine

## 2019-10-28 DIAGNOSIS — L820 Inflamed seborrheic keratosis: Secondary | ICD-10-CM | POA: Diagnosis not present

## 2019-11-14 ENCOUNTER — Other Ambulatory Visit: Payer: Self-pay | Admitting: Internal Medicine

## 2019-11-14 DIAGNOSIS — Z1231 Encounter for screening mammogram for malignant neoplasm of breast: Secondary | ICD-10-CM

## 2019-11-22 DIAGNOSIS — R69 Illness, unspecified: Secondary | ICD-10-CM | POA: Diagnosis not present

## 2019-12-28 ENCOUNTER — Other Ambulatory Visit: Payer: Self-pay | Admitting: Internal Medicine

## 2020-01-04 ENCOUNTER — Ambulatory Visit
Admission: RE | Admit: 2020-01-04 | Discharge: 2020-01-04 | Disposition: A | Discharge status: Home / Self Care | Payer: Medicare HMO | Source: Ambulatory Visit | Attending: Internal Medicine | Admitting: Internal Medicine

## 2020-01-04 ENCOUNTER — Other Ambulatory Visit: Payer: Self-pay

## 2020-01-04 DIAGNOSIS — Z1231 Encounter for screening mammogram for malignant neoplasm of breast: Secondary | ICD-10-CM

## 2020-01-10 DIAGNOSIS — R69 Illness, unspecified: Secondary | ICD-10-CM | POA: Diagnosis not present

## 2020-01-23 DIAGNOSIS — Z0000 Encounter for general adult medical examination without abnormal findings: Secondary | ICD-10-CM | POA: Diagnosis not present

## 2020-02-20 NOTE — Patient Instructions (Addendum)
Consider getting the shingles vaccine and tetanus vaccines at your pharmacy.    Think about seeing a urogynecologist.    Blood work was ordered.    All other Health Maintenance issues reviewed.   All recommended immunizations and age-appropriate screenings are up-to-date or discussed.  No immunization administered today.   Medications reviewed and updated.  Changes include :   none    Please followup in 6 months    Health Maintenance, Female Adopting a healthy lifestyle and getting preventive care are important in promoting health and wellness. Ask your health care provider about:  The right schedule for you to have regular tests and exams.  Things you can do on your own to prevent diseases and keep yourself healthy. What should I know about diet, weight, and exercise? Eat a healthy diet   Eat a diet that includes plenty of vegetables, fruits, low-fat dairy products, and lean protein.  Do not eat a lot of foods that are high in solid fats, added sugars, or sodium. Maintain a healthy weight Body mass index (BMI) is used to identify weight problems. It estimates body fat based on height and weight. Your health care provider can help determine your BMI and help you achieve or maintain a healthy weight. Get regular exercise Get regular exercise. This is one of the most important things you can do for your health. Most adults should:  Exercise for at least 150 minutes each week. The exercise should increase your heart rate and make you sweat (moderate-intensity exercise).  Do strengthening exercises at least twice a week. This is in addition to the moderate-intensity exercise.  Spend less time sitting. Even light physical activity can be beneficial. Watch cholesterol and blood lipids Have your blood tested for lipids and cholesterol at 75 years of age, then have this test every 5 years. Have your cholesterol levels checked more often if:  Your lipid or cholesterol levels are  high.  You are older than 75 years of age.  You are at high risk for heart disease. What should I know about cancer screening? Depending on your health history and family history, you may need to have cancer screening at various ages. This may include screening for:  Breast cancer.  Cervical cancer.  Colorectal cancer.  Skin cancer.  Lung cancer. What should I know about heart disease, diabetes, and high blood pressure? Blood pressure and heart disease  High blood pressure causes heart disease and increases the risk of stroke. This is more likely to develop in people who have high blood pressure readings, are of African descent, or are overweight.  Have your blood pressure checked: ? Every 3-5 years if you are 61-22 years of age. ? Every year if you are 11 years old or older. Diabetes Have regular diabetes screenings. This checks your fasting blood sugar level. Have the screening done:  Once every three years after age 67 if you are at a normal weight and have a low risk for diabetes.  More often and at a younger age if you are overweight or have a high risk for diabetes. What should I know about preventing infection? Hepatitis B If you have a higher risk for hepatitis B, you should be screened for this virus. Talk with your health care provider to find out if you are at risk for hepatitis B infection. Hepatitis C Testing is recommended for:  Everyone born from [birth year] through 1965.  Anyone with known risk factors for hepatitis C. Sexually transmitted infections (STIs)  Get screened for STIs, including gonorrhea and chlamydia, if: ? You are sexually active and are younger than 75 years of age. ? You are older than 75 years of age and your health care provider tells you that you are at risk for this type of infection. ? Your sexual activity has changed since you were last screened, and you are at increased risk for chlamydia or gonorrhea. Ask your health care provider if you  are at risk.  Ask your health care provider about whether you are at high risk for HIV. Your health care provider may recommend a prescription medicine to help prevent HIV infection. If you choose to take medicine to prevent HIV, you should first get tested for HIV. You should then be tested every 3 months for as long as you are taking the medicine. Pregnancy  If you are about to stop having your period (premenopausal) and you may become pregnant, seek counseling before you get pregnant.  Take 400 to 800 micrograms (mcg) of folic acid every day if you become pregnant.  Ask for birth control (contraception) if you want to prevent pregnancy. Osteoporosis and menopause Osteoporosis is a disease in which the bones lose minerals and strength with aging. This can result in bone fractures. If you are 85 years old or older, or if you are at risk for osteoporosis and fractures, ask your health care provider if you should:  Be screened for bone loss.  Take a calcium or vitamin D supplement to lower your risk of fractures.  Be given hormone replacement therapy (HRT) to treat symptoms of menopause. Follow these instructions at home: Lifestyle  Do not use any products that contain nicotine or tobacco, such as cigarettes, e-cigarettes, and chewing tobacco. If you need help quitting, ask your health care provider.  Do not use street drugs.  Do not share needles.  Ask your health care provider for help if you need support or information about quitting drugs. Alcohol use  Do not drink alcohol if: ? Your health care provider tells you not to drink. ? You are pregnant, may be pregnant, or are planning to become pregnant.  If you drink alcohol: ? Limit how much you use to 0-1 drink a day. ? Limit intake if you are breastfeeding.  Be aware of how much alcohol is in your drink. In the [place] one drink equals one 12 oz bottle of beer (355 mL), one 5 oz glass of wine (148 mL), or one 1 oz glass of hard  liquor (44 mL). General instructions  Schedule regular health, dental, and eye exams.  Stay current with your vaccines.  Tell your health care provider if: ? You often feel depressed. ? You have ever been abused or do not feel safe at home. Summary  Adopting a healthy lifestyle and getting preventive care are important in promoting health and wellness.  Follow your health care provider's instructions about healthy diet, exercising, and getting tested or screened for diseases.  Follow your health care provider's instructions on monitoring your cholesterol and blood pressure. This information is not intended to replace advice given to you by your health care provider. Make sure you discuss any questions you have with your health care provider. Document Revised: 09/22/2018 Document Reviewed: 09/22/2018 Elsevier Patient Education  2020 [employer].

## 2020-02-20 NOTE — Progress Notes (Signed)
Subjective:    Patient ID: Pam Butler, female    DOB: 09-15-1946, 74 y.o.   MRN: [id number]  HPI She is here for a physical exam.   She still has b/l shoulder pain.  She has left wrist and left 3rd finger pain.   Medications and allergies reviewed with patient and updated if appropriate.  Patient Active Problem List   Diagnosis Date Noted  . 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 Chest pressure 02/14/2019  . 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 Obesity (BMI 30.0-34.9) 07/13/2018  . Osteopenia 02/18/2018  . Prediabetes 11/12/2016  . Anxiety 03/08/2010  . IBS 03/08/2010  . Hyperlipidemia 05/05/2008  . Depression 05/05/2008  . GERD 05/05/2008    Current Outpatient Medications on File Prior to Visit  Medication Sig Dispense Refill  . Acetaminophen (TYLENOL EXTRA STRENGTH PO) Take by mouth. Take one prn    . aspirin EC 325 MG tablet Take 325 mg by mouth as needed.    . Calcium Carb-Cholecalciferol (CALCIUM 600 + D PO) Take by mouth daily.    . calcium carbonate (TUMS - DOSED IN MG ELEMENTAL CALCIUM) 500 MG chewable tablet Chew 1 tablet by mouth as needed for indigestion or heartburn.    . cetirizine (ZYRTEC) 10 MG tablet TAKE 1 TABLET BY MOUTH EVERY DAY 90 tablet 1  . citalopram (CELEXA) 20 MG tablet Take 1 tablet (20 mg total) by mouth daily. 90 tablet 3  . ezetimibe (ZETIA) 10 MG tablet TAKE 1 TABLET BY MOUTH EVERY DAY 90 tablet 1  . Melatonin 10 MG CAPS Take by mouth as needed.    . Multiple Vitamins-Minerals (WOMENS MULTI VITAMIN & MINERAL PO) Take by mouth. Centrum Womens MVI-Take one daily     No current facility-administered medications on file prior to visit.    Past Medical History:  Diagnosis Date  . Allergy   . Anxiety   . Cataract    Bil  . Depression   . History of bloody stools    in past/  . History of muscle pain    Bil pain in bil arms and shoulders/ chronic since last year/left shoulder started this year  . Loose stools    on and off/occasional blood in  stool    Past Surgical History:  Procedure Laterality Date  . ABDOMINAL HYSTERECTOMY     with bladder tack  . APPENDECTOMY    . BREAST BIOPSY Right 2001   benign  . CHOLECYSTECTOMY    . COLONOSCOPY    . TUBAL LIGATION      Social History   Socioeconomic History  . Marital status: Married    Spouse name: Not on file  . Number of children: 2  . Years of education: Not on file  . Highest education level: Not on file  Occupational History  . Occupation: retired  Tobacco Use  . Smoking status: Never Smoker  . Smokeless tobacco: Never Used  Substance and Sexual Activity  . Alcohol use: No    Alcohol/week: 0.0 standard drinks  . Drug use: No  . Sexual activity: Never  Other Topics Concern  . Not on file  Social History Narrative   Exercise: none   Social Determinants of Health   Financial Resource Strain:   . Difficulty of Paying Living Expenses:   Food Insecurity:   . Worried About Charity fundraiser in the Last Year:   . Arboriculturist in the Last Year:   Transportation Needs:   .  Lack of Transportation (Medical):   Marland Kitchen Lack of Transportation (Non-Medical):   Physical Activity: Inactive  . Days of Exercise per Week: 0 days  . Minutes of Exercise per Session: 0 min  Stress:   . Feeling of Stress :   Social Connections:   . Frequency of Communication with Friends and Family:   . Frequency of Social Gatherings with Friends and Family:   . Attends Religious Services:   . Active Member of Clubs or Organizations:   . Attends Archivist Meetings:   Marland Kitchen Marital Status:     Family History  Problem Relation Age of Onset  . Other Mother        Prediabetes  . Colitis Mother   . Pneumonia Father   . Arthritis Sister   . Heart attack Paternal Grandmother 56  . Heart attack Maternal Uncle 67  . Colon cancer Neg Hx     Review of Systems  Constitutional: Negative for chills and fever.  Eyes: Negative for visual disturbance.  Respiratory: Positive for  shortness of breath. Negative for cough and wheezing.   Cardiovascular: Positive for leg swelling (resolves over night). Negative for chest pain and palpitations.  Gastrointestinal: Positive for anal bleeding (occ - hemorrhoidal), constipation (intermittent), diarrhea (intermittent) and nausea (resolves with eating). Negative for abdominal pain and blood in stool.       Occ gerd - takes tums  Genitourinary: Positive for urgency (with some urge incontinence). Negative for frequency.  Musculoskeletal: Positive for arthralgias.  Skin: Negative for color change and rash.  Neurological: Negative for headaches.  Psychiatric/Behavioral: Positive for dysphoric mood. The patient is nervous/anxious.        Objective:   Vitals:   02/21/20 1351  BP: 134/80  Pulse: 64  Resp: 16  Temp: 98.1 F (36.7 C)  SpO2: 99%   Filed Weights   02/21/20 1351  Weight: 184 lb (83.5 kg)   Body mass index is 33.65 kg/m.  BP Readings from Last 3 Encounters:  02/21/20 134/80  08/23/19 122/70  04/11/19 (!) 146/72    Wt Readings from Last 3 Encounters:  02/21/20 184 lb (83.5 kg)  08/23/19 174 lb 12.8 oz (79.3 kg)  04/11/19 183 lb 12.8 oz (83.4 kg)     Physical Exam Constitutional: She appears well-developed and well-nourished. No distress.  HENT:  Head: Normocephalic and atraumatic.  Right Ear: External ear normal. Normal ear canal and TM Left Ear: External ear normal.  Normal ear canal and TM Mouth/Throat: Oropharynx is clear and moist.  Eyes: Conjunctivae and EOM are normal.  Neck: Neck supple. No tracheal deviation present. No thyromegaly present.  No carotid bruit  Cardiovascular: Normal rate, regular rhythm and normal heart sounds.   No murmur heard.  No edema. Pulmonary/Chest: Effort normal and breath sounds normal. No respiratory distress. She has no wheezes. She has no rales.  Breast: deferred   Abdominal: Soft. She exhibits no distension. There is no tenderness.  Lymphadenopathy: She  has no cervical adenopathy.  Skin: Skin is warm and dry. She is not diaphoretic.  Psychiatric: She has a normal mood and affect. Her behavior is normal.        Assessment & Plan:   Physical exam: Screening blood work    ordered Immunizations  Discussed tdap, shingrix, had covid Colonoscopy  Up to date  Mammogram  Up to date  Gyn  n/a Dexa  Up to date  Eye exams  Up to date  Exercise   Riding stationary bike  some Weight  Encouraged weight loss Substance abuse   none  See Problem List for Assessment and Plan of chronic medical problems.      This visit occurred during the SARS-CoV-2 public health emergency.  Safety protocols were in place, including screening questions prior to the visit, additional usage of staff PPE, and extensive cleaning of exam room while observing appropriate contact time as indicated for disinfecting solutions.

## 2020-02-21 ENCOUNTER — Encounter: Payer: Self-pay | Admitting: Internal Medicine

## 2020-02-21 ENCOUNTER — Other Ambulatory Visit: Payer: Self-pay

## 2020-02-21 ENCOUNTER — Ambulatory Visit (INDEPENDENT_AMBULATORY_CARE_PROVIDER_SITE_OTHER): Payer: Medicare HMO | Admitting: Internal Medicine

## 2020-02-21 VITALS — BP 134/80 | HR 64 | Temp 98.1°F | Resp 16 | Ht 62.0 in | Wt 184.0 lb

## 2020-02-21 DIAGNOSIS — Z0000 Encounter for general adult medical examination without abnormal findings: Secondary | ICD-10-CM | POA: Diagnosis not present

## 2020-02-21 DIAGNOSIS — K219 Gastro-esophageal reflux disease without esophagitis: Secondary | ICD-10-CM | POA: Diagnosis not present

## 2020-02-21 DIAGNOSIS — M85851 Other specified disorders of bone density and structure, right thigh: Secondary | ICD-10-CM

## 2020-02-21 DIAGNOSIS — R32 Unspecified urinary incontinence: Secondary | ICD-10-CM | POA: Diagnosis not present

## 2020-02-21 DIAGNOSIS — E669 Obesity, unspecified: Secondary | ICD-10-CM | POA: Diagnosis not present

## 2020-02-21 DIAGNOSIS — J309 Allergic rhinitis, unspecified: Secondary | ICD-10-CM

## 2020-02-21 DIAGNOSIS — E7849 Other hyperlipidemia: Secondary | ICD-10-CM | POA: Diagnosis not present

## 2020-02-21 DIAGNOSIS — R7303 Prediabetes: Secondary | ICD-10-CM | POA: Diagnosis not present

## 2020-02-21 DIAGNOSIS — E66811 Obesity, class 1: Secondary | ICD-10-CM

## 2020-02-21 DIAGNOSIS — R69 Illness, unspecified: Secondary | ICD-10-CM | POA: Diagnosis not present

## 2020-02-21 DIAGNOSIS — F419 Anxiety disorder, unspecified: Secondary | ICD-10-CM | POA: Diagnosis not present

## 2020-02-21 DIAGNOSIS — F3289 Other specified depressive episodes: Secondary | ICD-10-CM

## 2020-02-21 NOTE — Assessment & Plan Note (Signed)
Chronic Check a1c Low sugar / carb diet Stressed regular exercise  

## 2020-02-21 NOTE — Assessment & Plan Note (Signed)
Chronic Taking zyrtec daily continue

## 2020-02-21 NOTE — Assessment & Plan Note (Addendum)
Chronic S/p sling years ago Has urge incontinence Goes to the bathroom frequently to avoid a full bladder

## 2020-02-21 NOTE — Assessment & Plan Note (Signed)
occ gerd Takes tums as needed

## 2020-02-21 NOTE — Assessment & Plan Note (Signed)
Chronic Check lipid panel  Continue daily zetia Regular exercise and healthy diet encouraged  

## 2020-02-21 NOTE — Assessment & Plan Note (Signed)
Chronic Encouraged regular exercise Encouraged healthy diet Advised working on weight loss

## 2020-02-21 NOTE — Assessment & Plan Note (Signed)
Chronic dexa up to date Taking calcium and vitamin d Encouraged regular exercise

## 2020-02-21 NOTE — Assessment & Plan Note (Signed)
Chronic Controlled, stable Continue current dose of medication - celexa 20 mg daily, sometimes 40 mg 2-3 times a week Continue above

## 2020-02-23 ENCOUNTER — Other Ambulatory Visit: Payer: Self-pay

## 2020-02-23 ENCOUNTER — Other Ambulatory Visit (INDEPENDENT_AMBULATORY_CARE_PROVIDER_SITE_OTHER): Payer: Medicare HMO

## 2020-02-23 DIAGNOSIS — E7849 Other hyperlipidemia: Secondary | ICD-10-CM

## 2020-02-23 DIAGNOSIS — Z0000 Encounter for general adult medical examination without abnormal findings: Secondary | ICD-10-CM | POA: Diagnosis not present

## 2020-02-23 DIAGNOSIS — R7303 Prediabetes: Secondary | ICD-10-CM | POA: Diagnosis not present

## 2020-02-23 LAB — COMPREHENSIVE METABOLIC PANEL
ALT: 15 U/L (ref 0–35)
AST: 17 U/L (ref 0–37)
Albumin: 4.2 g/dL (ref 3.5–5.2)
Alkaline Phosphatase: 55 U/L (ref 39–117)
BUN: 20 mg/dL (ref 6–23)
CO2: 24 mEq/L (ref 19–32)
Calcium: 9.2 mg/dL (ref 8.4–10.5)
Chloride: 106 mEq/L (ref 96–112)
Creatinine, Ser: 0.9 mg/dL (ref 0.40–1.20)
GFR: 61.05 mL/min (ref >60.00)
Glucose, Bld: 93 mg/dL (ref 70–99)
Potassium: 3.7 mEq/L (ref 3.5–5.1)
Sodium: 139 mEq/L (ref 135–145)
Total Bilirubin: 0.4 mg/dL (ref 0.2–1.2)
Total Protein: 7 g/dL (ref 6.0–8.3)

## 2020-02-23 LAB — LIPID PANEL
Cholesterol: 221 mg/dL — ABNORMAL HIGH (ref 0–200)
HDL: 36.3 mg/dL — ABNORMAL LOW (ref >39.00)
Total CHOL/HDL Ratio: 6
Triglycerides: 472 mg/dL — ABNORMAL HIGH (ref 0.0–149.0)

## 2020-02-23 LAB — CBC WITH DIFFERENTIAL/PLATELET
Basophils Absolute: 0.1 10*3/uL (ref 0.0–0.1)
Basophils Relative: 0.8 % (ref 0.0–3.0)
Eosinophils Absolute: 0.3 10*3/uL (ref 0.0–0.7)
Eosinophils Relative: 3.9 % (ref 0.0–5.0)
HCT: 35.5 % — ABNORMAL LOW (ref 36.0–46.0)
Hemoglobin: 12.6 g/dL (ref 12.0–15.0)
Lymphocytes Relative: 41.2 % (ref 12.0–46.0)
Lymphs Abs: 2.9 10*3/uL (ref 0.7–4.0)
MCHC: 35.5 g/dL (ref 30.0–36.0)
MCV: 88.1 fl (ref 78.0–100.0)
Monocytes Absolute: 0.7 10*3/uL (ref 0.1–1.0)
Monocytes Relative: 9.2 % (ref 3.0–12.0)
Neutro Abs: 3.2 10*3/uL (ref 1.4–7.7)
Neutrophils Relative %: 44.9 % (ref 43.0–77.0)
Platelets: 257 10*3/uL (ref 150.0–400.0)
RBC: 4.03 Mil/uL (ref 3.87–5.11)
RDW: 13.1 % (ref 11.5–15.5)
WBC: 7.1 10*3/uL (ref 4.0–10.5)

## 2020-02-23 LAB — TSH: TSH: 2.81 u[IU]/mL (ref 0.35–4.50)

## 2020-02-23 LAB — LDL CHOLESTEROL, DIRECT: Direct LDL: 107 mg/dL

## 2020-02-23 LAB — HEMOGLOBIN A1C: Hgb A1c MFr Bld: 5.6 % (ref 4.6–6.5)

## 2020-03-21 ENCOUNTER — Other Ambulatory Visit: Payer: Self-pay

## 2020-03-21 ENCOUNTER — Ambulatory Visit: Payer: Self-pay

## 2020-03-21 ENCOUNTER — Ambulatory Visit: Payer: Medicare HMO | Admitting: Family Medicine

## 2020-03-21 ENCOUNTER — Ambulatory Visit (INDEPENDENT_AMBULATORY_CARE_PROVIDER_SITE_OTHER): Payer: Medicare HMO

## 2020-03-21 VITALS — BP 114/76 | HR 73 | Ht 62.0 in | Wt 184.0 lb

## 2020-03-21 DIAGNOSIS — M25512 Pain in left shoulder: Secondary | ICD-10-CM

## 2020-03-21 DIAGNOSIS — M778 Other enthesopathies, not elsewhere classified: Secondary | ICD-10-CM

## 2020-03-21 DIAGNOSIS — G8929 Other chronic pain: Secondary | ICD-10-CM | POA: Diagnosis not present

## 2020-03-21 DIAGNOSIS — M19011 Primary osteoarthritis, right shoulder: Secondary | ICD-10-CM | POA: Diagnosis not present

## 2020-03-21 DIAGNOSIS — M25562 Pain in left knee: Secondary | ICD-10-CM

## 2020-03-21 DIAGNOSIS — M25532 Pain in left wrist: Secondary | ICD-10-CM

## 2020-03-21 DIAGNOSIS — M25511 Pain in right shoulder: Secondary | ICD-10-CM

## 2020-03-21 DIAGNOSIS — M7989 Other specified soft tissue disorders: Secondary | ICD-10-CM | POA: Diagnosis not present

## 2020-03-21 DIAGNOSIS — M1712 Unilateral primary osteoarthritis, left knee: Secondary | ICD-10-CM | POA: Diagnosis not present

## 2020-03-21 NOTE — Progress Notes (Signed)
Pam Butler [place] Phone: [phone] Subjective:   Fontaine No, am serving as a scribe for Dr. Hulan Saas. This visit occurred during the SARS-CoV-2 public health emergency.  Safety protocols were in place, including screening questions prior to the visit, additional usage of staff PPE, and extensive cleaning of exam room while observing appropriate contact time as indicated for disinfecting solutions.   I'm seeing this patient by the request  of:  Pam Rail, MD  CC: Bilateral shoulder pain  LKG:[id number]  Pam Butler is a 74 y.o. female coming in with complaint of right shoulder adhesive capsulitis. Seen in 2019 for similar pain. Left shoulder is bothering her as well.  Patient states that it is affecting daily activities.  Even lifting her hands above her head for long amount of time causes increasing discomfort.  Sometimes needs to use her right arm to pick up her left arm.  Feels like she has had decreasing range of motion.  Patient was lost to follow-up on the right side previously.  States that continues to have same symptoms on that side as well but maybe not as severe as it was 2 years ago  Having left wrist pain for 6 months after taking off her top and over extending wrist. Pain radiates down into 3rd metacarpal. Unable to close hand in the mornings. Has sharp pain with certain unidentified movements.   Also having left knee pain. No injury. Feels a catching in the knee intermittently.  Not a large problem but just because she is here thought she would discuss it      Past Medical History:  Diagnosis Date  . Allergy   . Anxiety   . Cataract    Bil  . Depression   . History of bloody stools    in past/  . History of muscle pain    Bil pain in bil arms and shoulders/ chronic since last year/left shoulder started this year  . Loose stools    on and off/occasional blood in stool   Past Surgical  History:  Procedure Laterality Date  . ABDOMINAL HYSTERECTOMY     with bladder tack  . APPENDECTOMY    . BREAST BIOPSY Right 2001   benign  . CHOLECYSTECTOMY    . COLONOSCOPY    . TUBAL LIGATION     Social History   Socioeconomic History  . Marital status: Married    Spouse name: Not on file  . Number of children: 2  . Years of education: Not on file  . Highest education level: Not on file  Occupational History  . Occupation: retired  Tobacco Use  . Smoking status: Never Smoker  . Smokeless tobacco: Never Used  Vaping Use  . Vaping Use: Never used  Substance and Sexual Activity  . Alcohol use: No    Alcohol/week: 0.0 standard drinks  . Drug use: No  . Sexual activity: Never  Other Topics Concern  . Not on file  Social History Narrative   Exercise: none   Social Determinants of Health   Financial Resource Strain:   . Difficulty of Paying Living Expenses:   Food Insecurity:   . Worried About Charity fundraiser in the Last Year:   . Arboriculturist in the Last Year:   Transportation Needs:   . Film/video editor (Medical):   Marland Kitchen Lack of Transportation (Non-Medical):   Physical Activity: Inactive  . Days  of Exercise per Week: 0 days  . Minutes of Exercise per Session: 0 min  Stress:   . Feeling of Stress :   Social Connections:   . Frequency of Communication with Friends and Family:   . Frequency of Social Gatherings with Friends and Family:   . Attends Religious Services:   . Active Member of Clubs or Organizations:   . Attends Archivist Meetings:   Marland Kitchen Marital Status:    Allergies  Allergen Reactions  . Latex     Causes swelling around face  . Penicillins     Hives  . Ciprofloxacin     Itching and hives  . Codeine     Itching(note: She has taken codeine containing cough preparations since)   Family History  Problem Relation Age of Onset  . Other Mother        Prediabetes  . Colitis Mother   . Pneumonia Father   . Arthritis Sister     . Heart attack Paternal Grandmother 35  . Heart attack Maternal Uncle 67  . Colon cancer Neg Hx      Current Outpatient Medications (Cardiovascular):  .  ezetimibe (ZETIA) 10 MG tablet, TAKE 1 TABLET BY MOUTH EVERY DAY  Current Outpatient Medications (Respiratory):  .  cetirizine (ZYRTEC) 10 MG tablet, TAKE 1 TABLET BY MOUTH EVERY DAY  Current Outpatient Medications (Analgesics):  Marland Kitchen  Acetaminophen (TYLENOL EXTRA STRENGTH PO), Take by mouth. Take one prn .  aspirin EC 325 MG tablet, Take 325 mg by mouth as needed.   Current Outpatient Medications (Other):  Marland Kitchen  Calcium Carb-Cholecalciferol (CALCIUM 600 + D PO), Take by mouth daily. .  calcium carbonate (TUMS - DOSED IN MG ELEMENTAL CALCIUM) 500 MG chewable tablet, Chew 1 tablet by mouth as needed for indigestion or heartburn. .  citalopram (CELEXA) 20 MG tablet, Take 1 tablet (20 mg total) by mouth daily. .  Melatonin 10 MG CAPS, Take by mouth as needed. .  Multiple Vitamins-Minerals (WOMENS MULTI VITAMIN & MINERAL PO), Take by mouth. Centrum Womens MVI-Take one daily   Reviewed prior external information including notes and imaging from  primary care provider As well as notes that were available from care everywhere and other healthcare systems.  Past medical history, social, surgical and family history all reviewed in electronic medical record.  No pertanent information unless stated regarding to the chief complaint.   Review of Systems:  No headache, visual changes, nausea, vomiting, diarrhea, constipation, dizziness, abdominal pain, skin rash, fevers, chills, night sweats, weight loss, swollen lymph nodes, , joint swelling, chest pain, shortness of breath, mood changes. POSITIVE muscle aches, body aches  Objective  Blood pressure 114/76, pulse 73, height 5\' 2"  (1.575 m), weight 184 lb (83.5 kg), SpO2 98 %.   General: No apparent distress alert and oriented x3 mood and affect normal, dressed appropriately.  HEENT: Pupils  equal, extraocular movements intact  Respiratory: Patient's speak in full sentences and does not appear short of breath  Cardiovascular: Trace lower extremity edema, non tender, no erythema  Neuro: Cranial nerves II through XII are intact, neurovascularly intact in all extremities with 2+ DTRs and 2+ pulses.  Gait mild antalgic MSK: Bilateral shoulder exam showed the patient does have atrophy.  Some mild increase in limited range of motion when it comes to patient's left shoulder in internal and external range of motion.  3+ out of 5 strength on the left and 4 out of 5 strength on the right.  Left  wrist exam shows the patient does have swelling on the dorsal aspect of the wrist.  Some mild weakness noted with dorsi flexion against resistance.  Good grip strength though.  Limited musculoskeletal ultrasound was performed and interpreted b Lyndal Pulley  .Limited ultrasound of patient's left wrist shows that patient did have a tendon injury with hypoechoic changes within the tendon sheath consistent with a chronic tendinitis.  Mild scar tissue formation in the area as well.  This is in the third dorsal compartment  Procedure: Real-time Ultrasound Guided Injection of right glenohumeral joint Device: GE Logiq Q7  Ultrasound guided injection is preferred based studies that show increased duration, increased effect, greater accuracy, decreased procedural pain, increased response rate with ultrasound guided versus blind injection.  Verbal informed consent obtained.  Time-out conducted.  Noted no overlying erythema, induration, or other signs of local infection.  Skin prepped in a sterile fashion.  Local anesthesia: Topical Ethyl chloride.  With sterile technique and under real time ultrasound guidance:  Joint visualized.  23g 1  inch needle inserted posterior approach. Pictures taken for needle placement. Patient did have injection of 2 cc of 1% lidocaine, 2 cc of 0.5% Marcaine, and 1.0 cc of Kenalog  40 mg/dL. Completed without difficulty  Pain immediately resolved suggesting accurate placement of the medication.  Advised to call if fevers/chills, erythema, induration, drainage, or persistent bleeding.  Images permanently stored and available for review in the ultrasound unit.  Impression: Technically successful ultrasound guided injection.  Procedure: Real-time Ultrasound Guided Injection of left glenohumeral joint Device: GE Logiq E  Ultrasound guided injection is preferred based studies that show increased duration, increased effect, greater accuracy, decreased procedural pain, increased response rate with ultrasound guided versus blind injection.  Verbal informed consent obtained.  Time-out conducted.  Noted no overlying erythema, induration, or other signs of local infection.  Skin prepped in a sterile fashion.  Local anesthesia: Topical Ethyl chloride.  With sterile technique and under real time ultrasound guidance:  Joint visualized.  21g 2 inch needle inserted posterior approach. Pictures taken for needle placement. Patient did have injection of 2 cc of 0.5% Marcaine, and 1cc of Kenalog 40 mg/dL. Completed without difficulty  Pain immediately resolved suggesting accurate placement of the medication.  Advised to call if fevers/chills, erythema, induration, drainage, or persistent bleeding.  Images permanently stored and available for review in the ultrasound unit.  Impression: Technically successful ultrasound guided injection.     Impression and Recommendations:     The above documentation has been reviewed and is accurate and complete Lyndal Pulley, DO       Note: This dictation was prepared with Dragon dictation along with smaller phrase technology. Any transcriptional errors that result from this process are unintentional.

## 2020-03-21 NOTE — Patient Instructions (Addendum)
Wrist brace day and night for 2 weeks, then nightly for 2 weeks Exercises for shoulder  xray today See me again in 2 months

## 2020-03-22 ENCOUNTER — Encounter: Payer: Self-pay | Admitting: Family Medicine

## 2020-03-22 DIAGNOSIS — M25511 Pain in right shoulder: Secondary | ICD-10-CM | POA: Insufficient documentation

## 2020-03-22 DIAGNOSIS — M778 Other enthesopathies, not elsewhere classified: Secondary | ICD-10-CM | POA: Insufficient documentation

## 2020-03-22 NOTE — Assessment & Plan Note (Signed)
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  I do think that this is contributing to some of the aches and pains.  Given a brace today to wear day and night but I think will be beneficial.  Discussed icing regimen and home exercises discussed topical anti-inflammatories.  I do believe patient should do relatively well with conservative therapy follow-up again in 6 weeks.

## 2020-03-22 NOTE — Assessment & Plan Note (Signed)
Appears to be a chronic problem.  I do believe the patient does have some rotator cuff arthropathy but did not further evaluate with ultrasound today.  Instead decided to just do injections.  Patient tolerated the procedure well, discussed icing regimen and home exercises.  Trial of topical anti-inflammatories given.  Patient is to increase activity slowly x-rays in future.  Follow-up again in 6 weeks

## 2020-03-30 ENCOUNTER — Other Ambulatory Visit: Payer: Self-pay

## 2020-03-30 ENCOUNTER — Encounter: Payer: Self-pay | Admitting: Family Medicine

## 2020-03-30 ENCOUNTER — Ambulatory Visit: Payer: Medicare HMO | Admitting: Family Medicine

## 2020-03-30 VITALS — BP 124/80 | HR 61 | Ht 62.0 in | Wt 181.8 lb

## 2020-03-30 DIAGNOSIS — S39012A Strain of muscle, fascia and tendon of lower back, initial encounter: Secondary | ICD-10-CM | POA: Diagnosis not present

## 2020-03-30 MED ORDER — CYCLOBENZAPRINE HCL 5 MG PO TABS: 5.0000 mg | ORAL_TABLET | Freq: Every evening | ORAL | 0 refills | Status: DC | PRN

## 2020-03-30 NOTE — Progress Notes (Signed)
° °  I, Wendy Poet, LAT, ATC, am serving as scribe for Dr. Lynne Leader.  Pam Butler is a 74 y.o. female who presents to [facility] today for back/side pain.  She was last seen by Dr. Tamala Julian on 03/21/20 for her L knee, L wrist and B shoulders and had B shoulder injections.  Since then, pt reports R lower back pain that radiating into her R abdomen that began on Tuesday.  She states that she took Tylenol and laid down for a few hours and then had much less pain.  She reports some con't soreness and intermittent sharper pain since.  The only noted aggravating factor was L cervical rotation and R arm elevation overhead that increased her R lower back pain.   Pertinent review of systems: No fevers or chills  Relevant historical information: Hyperlipidemia and depression IBS   Exam:  BP 124/80 (BP Location: Right Arm, Patient Position: Sitting, Cuff Size: Normal)    Pulse 61    Ht 5\' 2"  (1.575 m)    Wt 181 lb 12.8 oz (82.5 kg)    SpO2 98%    BMI 33.25 kg/m  General: Well Developed, well nourished, and in no acute distress.   MSK: L-spine nontender midline. Nontender paraspinal musculature. Normal motion minimal pain with right lateral flexion. Lower extremity strength reflexes and sensation are equal normal throughout distally. Abdomen nontender palpation.  No CVA angle tenderness to percussion. Normal active bowel sounds.  No rebound or guarding. Skin: No rash    Assessment and Plan: 74 y.o. female with right low back pain.  Unclear etiology however lumbosacral strain and spasm is most likely explanation.  Shingles is a possibility but much less given her improving symptoms.  UTI is incredibly unlikely.  Plan for watchful waiting with use of heating pad and Flexeril as needed.  Prescribed Flexeril low-dose and caution against confusion or sedation with this medication.  I do not think that she will actually need it but its approaching the weekend and she is worried  about pain returning and not having anything to do for pain.   PDMP not reviewed this encounter. No orders of the defined types were placed in this encounter.  Meds ordered this encounter  Medications   cyclobenzaprine (FLEXERIL) 5 MG tablet    Sig: Take 1 tablet (5 mg total) by mouth at bedtime as needed for muscle spasms.    Dispense:  20 tablet    Refill:  0     Discussed warning signs or symptoms. Please see discharge instructions. Patient expresses understanding.   The above documentation has been reviewed and is accurate and complete Lynne Leader, M.D.

## 2020-03-30 NOTE — Patient Instructions (Signed)
Thank you for coming in today. I think this is muscle spasm.  Use heating pad as needed.  Ok to use tylenol.  Ok to use muscle spasm medicine sparingly at bedtime as needed.  Recheck back as needed.

## 2020-04-09 ENCOUNTER — Other Ambulatory Visit: Payer: Self-pay | Admitting: Family Medicine

## 2020-04-12 ENCOUNTER — Other Ambulatory Visit: Payer: Self-pay | Admitting: Internal Medicine

## 2020-04-25 DIAGNOSIS — H04123 Dry eye syndrome of bilateral lacrimal glands: Secondary | ICD-10-CM | POA: Diagnosis not present

## 2020-04-25 DIAGNOSIS — H25813 Combined forms of age-related cataract, bilateral: Secondary | ICD-10-CM | POA: Diagnosis not present

## 2020-04-26 ENCOUNTER — Other Ambulatory Visit: Payer: Self-pay | Admitting: Internal Medicine

## 2020-05-14 NOTE — Progress Notes (Signed)
Subjective:    Patient ID: Pam Butler, female    DOB: 1945/12/24, 74 y.o.   MRN: [id number]  HPI The patient is here for an acute visit.   She wants to discuss cardiovascular disease.  She lifeline screening in April.  She is concerned about some of the values.  She had a very low vitamin D, and elevated TSH and an elevated high-sensitivity CRP.  She was also concerned of some of the other values were of concern.  She is still very anxious - her mom lives with her and it causes a lot of stress.  She has been celexa for years.  On occasion she does take an extra Celexa and that does seem to help.  She feels very irritable and agitated.    Medications and allergies reviewed with patient and updated if appropriate.  Patient Active Problem List   Diagnosis Date Noted  . Bilateral shoulder pain 03/22/2020  . Left wrist tendinitis 03/22/2020  . Urinary incontinence 02/21/2020  . Allergic rhinitis 02/21/2020  . 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 Chest pressure 02/14/2019  . 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 Obesity (BMI 30.0-34.9) 07/13/2018  . Osteopenia 02/18/2018  . Prediabetes 11/12/2016  . Anxiety 03/08/2010  . IBS 03/08/2010  . Hyperlipidemia 05/05/2008  . Depression 05/05/2008  . GERD 05/05/2008    Current Outpatient Medications on File Prior to Visit  Medication Sig Dispense Refill  . Acetaminophen (TYLENOL EXTRA STRENGTH PO) Take by mouth. Take one prn    . aspirin EC 325 MG tablet Take 325 mg by mouth as needed.    . Calcium Carb-Cholecalciferol (CALCIUM 600 + D PO) Take by mouth daily.    . calcium carbonate (TUMS - DOSED IN MG ELEMENTAL CALCIUM) 500 MG chewable tablet Chew 1 tablet by mouth as needed for indigestion or heartburn.    . cetirizine (ZYRTEC) 10 MG tablet TAKE 1 TABLET BY MOUTH EVERY DAY 90 tablet 1  . chlorhexidine (PERIDEX) 0.12 % solution SMARTSIG:0.5 Ounce(s) By Mouth Twice Daily    . citalopram (CELEXA) 20 MG tablet TAKE 1 TABLET BY MOUTH EVERY DAY  90 tablet 3  . cyclobenzaprine (FLEXERIL) 5 MG tablet Take 1 tablet (5 mg total) by mouth at bedtime as needed for muscle spasms. 20 tablet 0  . ezetimibe (ZETIA) 10 MG tablet TAKE 1 TABLET BY MOUTH EVERY DAY 90 tablet 1  . Melatonin 10 MG CAPS Take by mouth as needed.    . Multiple Vitamins-Minerals (WOMENS MULTI VITAMIN & MINERAL PO) Take by mouth. Centrum Womens MVI-Take one daily     No current facility-administered medications on file prior to visit.    Past Medical History:  Diagnosis Date  . Allergy   . Anxiety   . Cataract    Bil  . Depression   . History of bloody stools    in past/  . History of muscle pain    Bil pain in bil arms and shoulders/ chronic since last year/left shoulder started this year  . Loose stools    on and off/occasional blood in stool    Past Surgical History:  Procedure Laterality Date  . ABDOMINAL HYSTERECTOMY     with bladder tack  . APPENDECTOMY    . BREAST BIOPSY Right 2001   benign  . CHOLECYSTECTOMY    . COLONOSCOPY    . TUBAL LIGATION      Social History   Socioeconomic History  . Marital status: Married    Spouse name:  Not on file  . Number of children: 2  . Years of education: Not on file  . Highest education level: Not on file  Occupational History  . Occupation: retired  Tobacco Use  . Smoking status: Never Smoker  . Smokeless tobacco: Never Used  Vaping Use  . Vaping Use: Never used  Substance and Sexual Activity  . Alcohol use: No    Alcohol/week: 0.0 standard drinks  . Drug use: No  . Sexual activity: Never  Other Topics Concern  . Not on file  Social History Narrative   Exercise: none   Social Determinants of Health   Financial Resource Strain:   . Difficulty of Paying Living Expenses:   Food Insecurity:   . Worried About Charity fundraiser in the Last Year:   . Arboriculturist in the Last Year:   Transportation Needs:   . Film/video editor (Medical):   Marland Kitchen Lack of Transportation (Non-Medical):     Physical Activity:   . Days of Exercise per Week:   . Minutes of Exercise per Session:   Stress:   . Feeling of Stress :   Social Connections:   . Frequency of Communication with Friends and Family:   . Frequency of Social Gatherings with Friends and Family:   . Attends Religious Services:   . Active Member of Clubs or Organizations:   . Attends Archivist Meetings:   Marland Kitchen Marital Status:     Family History  Problem Relation Age of Onset  . Other Mother        Prediabetes  . Colitis Mother   . Pneumonia Father   . Arthritis Sister   . Heart attack Paternal Grandmother 39  . Heart attack Maternal Uncle 67  . Colon cancer Neg Hx     Review of Systems  Constitutional: Negative for fever.  Respiratory: Positive for shortness of breath. Negative for cough and wheezing.   Cardiovascular: Negative for chest pain, palpitations and leg swelling.  Gastrointestinal:       Occ GERD  Neurological: Negative for light-headedness and headaches (occ sharp pain in head).       Objective:   Vitals:   05/15/20 1403  BP: 128/70  Pulse: 84  Temp: 98.1 F (36.7 C)  SpO2: 98%   BP Readings from Last 3 Encounters:  05/15/20 128/70  03/30/20 124/80  03/21/20 114/76   Wt Readings from Last 3 Encounters:  05/15/20 184 lb (83.5 kg)  03/30/20 181 lb 12.8 oz (82.5 kg)  03/21/20 184 lb (83.5 kg)   Body mass index is 33.65 kg/m.   Physical Exam    Constitutional: Appears well-developed and well-nourished. No distress.  Head: Normocephalic and atraumatic.  Neck: Neck supple. No tracheal deviation present. No thyromegaly present.  No cervical lymphadenopathy Cardiovascular: Normal rate, regular rhythm and normal heart sounds.  No murmur heard. No carotid bruit .  No edema Pulmonary/Chest: Effort normal and breath sounds normal. No respiratory distress. No has no wheezes. No rales.  Skin: Skin is warm and dry. Not diaphoretic.  Psychiatric: Anxious mood and affect. Behavior is  normal.       Assessment & Plan:    See Problem List for Assessment and Plan of chronic medical problems.    This visit occurred during the SARS-CoV-2 public health emergency.  Safety protocols were in place, including screening questions prior to the visit, additional usage of staff PPE, and extensive cleaning of exam room while observing appropriate contact  time as indicated for disinfecting solutions.

## 2020-05-15 ENCOUNTER — Ambulatory Visit (INDEPENDENT_AMBULATORY_CARE_PROVIDER_SITE_OTHER): Payer: Medicare HMO | Admitting: Internal Medicine

## 2020-05-15 ENCOUNTER — Other Ambulatory Visit: Payer: Self-pay

## 2020-05-15 ENCOUNTER — Encounter: Payer: Self-pay | Admitting: Internal Medicine

## 2020-05-15 VITALS — BP 128/70 | HR 84 | Temp 98.1°F | Ht 62.0 in | Wt 184.0 lb

## 2020-05-15 DIAGNOSIS — E7849 Other hyperlipidemia: Secondary | ICD-10-CM

## 2020-05-15 DIAGNOSIS — M2550 Pain in unspecified joint: Secondary | ICD-10-CM | POA: Diagnosis not present

## 2020-05-15 DIAGNOSIS — E559 Vitamin D deficiency, unspecified: Secondary | ICD-10-CM | POA: Diagnosis not present

## 2020-05-15 DIAGNOSIS — F419 Anxiety disorder, unspecified: Secondary | ICD-10-CM | POA: Diagnosis not present

## 2020-05-15 DIAGNOSIS — R7982 Elevated C-reactive protein (CRP): Secondary | ICD-10-CM | POA: Diagnosis not present

## 2020-05-15 DIAGNOSIS — R69 Illness, unspecified: Secondary | ICD-10-CM | POA: Diagnosis not present

## 2020-05-15 MED ORDER — CALCIUM CARBONATE 600 MG PO TABS: 600.0000 mg | ORAL_TABLET | Freq: Every day | ORAL | Status: AC

## 2020-05-15 MED ORDER — SERTRALINE HCL 50 MG PO TABS: 50.0000 mg | ORAL_TABLET | Freq: Every day | ORAL | 5 refills | Status: DC

## 2020-05-15 MED ORDER — VITAMIN D 50 MCG (2000 UT) PO CAPS: ORAL_CAPSULE | ORAL | 0 refills | Status: AC

## 2020-05-15 NOTE — Patient Instructions (Addendum)
°  Blood work was ordered.  Have it done fasting.    Medications reviewed and updated.  Changes include :   Take citalopram every other day for one week then stop.  Start sertraline 50mg  nightly.     Your prescription(s) have been submitted to your pharmacy. Please take as directed and contact our office if you believe you are having problem(s) with the medication(s).   Follow up in November as scheduled.

## 2020-05-15 NOTE — Assessment & Plan Note (Addendum)
Had high hs-CRP with lifeline screening in April On zetia - has not tolerated statins Not currently exercising, overweight and not eating properly Stressed the importance of lifestyle changes Hs-crp today

## 2020-05-15 NOTE — Assessment & Plan Note (Signed)
Chronic Not controlled Stop celexa - take every other day for 1-2 weeks Start sertraline 50 mg at night

## 2020-05-15 NOTE — Assessment & Plan Note (Addendum)
Taking vitamin d daily-currently taking 2000 units daily plus what ever is in her multivitamin  level at lifeline screening was 10 Recheck vitamin d level

## 2020-05-15 NOTE — Assessment & Plan Note (Addendum)
Chronic Taking zetia  Check lipids, which has improved with Zetia.  She has not tolerated statins in the past Has high triglycerides - can consider vascepa Stressed lifestyle changes.  She is not exercising and I stressed the importance of regular exercise.  She is not compliant with a low sugar/carb diet and I also stressed the importance of this. Advised weight loss Will work on lifestyle changes and if that is not effective in lowering her triglycerides will need to consider medication

## 2020-05-16 ENCOUNTER — Other Ambulatory Visit: Payer: Self-pay

## 2020-05-16 ENCOUNTER — Encounter: Payer: Self-pay | Admitting: Family Medicine

## 2020-05-16 ENCOUNTER — Ambulatory Visit: Payer: Medicare HMO | Admitting: Family Medicine

## 2020-05-16 VITALS — BP 126/72 | HR 72 | Ht 62.0 in | Wt 185.0 lb

## 2020-05-16 DIAGNOSIS — M778 Other enthesopathies, not elsewhere classified: Secondary | ICD-10-CM

## 2020-05-16 DIAGNOSIS — M25512 Pain in left shoulder: Secondary | ICD-10-CM | POA: Diagnosis not present

## 2020-05-16 DIAGNOSIS — M25532 Pain in left wrist: Secondary | ICD-10-CM | POA: Diagnosis not present

## 2020-05-16 DIAGNOSIS — E559 Vitamin D deficiency, unspecified: Secondary | ICD-10-CM | POA: Diagnosis not present

## 2020-05-16 DIAGNOSIS — M2550 Pain in unspecified joint: Secondary | ICD-10-CM | POA: Diagnosis not present

## 2020-05-16 DIAGNOSIS — M25511 Pain in right shoulder: Secondary | ICD-10-CM | POA: Diagnosis not present

## 2020-05-16 DIAGNOSIS — R7982 Elevated C-reactive protein (CRP): Secondary | ICD-10-CM | POA: Diagnosis not present

## 2020-05-16 DIAGNOSIS — G8929 Other chronic pain: Secondary | ICD-10-CM | POA: Diagnosis not present

## 2020-05-16 DIAGNOSIS — E7849 Other hyperlipidemia: Secondary | ICD-10-CM | POA: Diagnosis not present

## 2020-05-16 LAB — CBC WITH DIFFERENTIAL/PLATELET
Absolute Monocytes: 800 cells/uL (ref 200–950)
Basophils Absolute: 56 cells/uL (ref 0–200)
Basophils Relative: 0.6 %
Eosinophils Absolute: 409 cells/uL (ref 15–500)
Eosinophils Relative: 4.4 %
HCT: 37 % (ref 35.0–45.0)
Hemoglobin: 12.6 g/dL (ref 11.7–15.5)
Lymphs Abs: 2511 cells/uL (ref 850–3900)
MCH: 30.1 pg (ref 27.0–33.0)
MCHC: 34.1 g/dL (ref 32.0–36.0)
MCV: 88.5 fL (ref 80.0–100.0)
MPV: 9.8 fL (ref 7.5–12.5)
Monocytes Relative: 8.6 %
Neutro Abs: 5524 cells/uL (ref 1500–7800)
Neutrophils Relative %: 59.4 %
Platelets: 284 10*3/uL (ref 140–400)
RBC: 4.18 10*6/uL (ref 3.80–5.10)
RDW: 12.6 % (ref 11.0–15.0)
Total Lymphocyte: 27 %
WBC: 9.3 10*3/uL (ref 3.8–10.8)

## 2020-05-16 LAB — SEDIMENTATION RATE: Sed Rate: 22 mm/h (ref 0–30)

## 2020-05-16 MED ORDER — PREDNISONE 20 MG PO TABS: 20.0000 mg | ORAL_TABLET | Freq: Two times a day (BID) | ORAL | 0 refills | Status: DC

## 2020-05-16 NOTE — Assessment & Plan Note (Signed)
Patient continues to have what seems to be more of a tendinitis.  We will start with formal physical therapy.  X-ray showed very minimal arthritic changes at this time.  May need consider the possibility of injection if this does not make improvement.  Otherwise we will need to consider advanced imaging.  Patient is in agreement with the plan and will follow up again in 6 to 8 weeks.  Inflammatory markers ordered today as well

## 2020-05-16 NOTE — Progress Notes (Signed)
[place] [address] [place] Phone: [phone] Subjective:   I Pam Butler am serving as a Education administrator for Dr. Hulan Saas.  This visit occurred during the SARS-CoV-2 public health emergency.  Safety protocols were in place, including screening questions prior to the visit, additional usage of staff PPE, and extensive cleaning of exam room while observing appropriate contact time as indicated for disinfecting solutions.   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  I do think that this is contributing to some of the aches and pains.  Given a brace today to wear day and night but I think will be beneficial.  Discussed icing regimen and home exercises discussed topical anti-inflammatories.  I do believe patient should do relatively well with conservative therapy follow-up again in 6 weeks.  Appears to be a chronic problem.  I do believe the patient does have some rotator cuff arthropathy but did not further evaluate with ultrasound today.  Instead decided to just do injections.  Patient tolerated the procedure well, discussed icing regimen and home exercises.  Trial of topical anti-inflammatories given.  Patient is to increase activity slowly x-rays in future.  Follow-up again in 6 weeks  Update 05/16/2020 Pam Butler is a 74 y.o. female coming in with complaint of bilateral shoulder and left wrist pain. Patient states she is doing better. States she still has some issues.  Patient feels that the left wrist seems to be the most complaining aspect of it at this time.  Still having pain when she moves it and different positions.    Past Medical History:  Diagnosis Date  . Allergy   . Anxiety   . Cataract    Bil  . Depression   . History of bloody stools    in past/  . History of muscle pain    Bil pain in bil arms  and shoulders/ chronic since last year/left shoulder started this year  . Loose stools    on and off/occasional blood in stool   Past Surgical History:  Procedure Laterality Date  . ABDOMINAL HYSTERECTOMY     with bladder tack  . APPENDECTOMY    . BREAST BIOPSY Right 2001   benign  . CHOLECYSTECTOMY    . COLONOSCOPY    . TUBAL LIGATION     Social History   Socioeconomic History  . Marital status: Married    Spouse name: Not on file  . Number of children: 2  . Years of education: Not on file  . Highest education level: Not on file  Occupational History  . Occupation: retired  Tobacco Use  . Smoking status: Never Smoker  . Smokeless tobacco: Never Used  Vaping Use  . Vaping Use: Never used  Substance and Sexual Activity  . Alcohol use: No    Alcohol/week: 0.0 standard drinks  . Drug use: No  . Sexual activity: Never  Other Topics Concern  . Not on file  Social History Narrative   Exercise: none   Social Determinants of Health   Financial Resource Strain:   . Difficulty of Paying Living Expenses:   Food Insecurity:   . Worried About Charity fundraiser in the Last Year:   . Arboriculturist in the Last Year:   Transportation Needs:   . Lack of Transportation (  Medical):   Marland Kitchen Lack of Transportation (Non-Medical):   Physical Activity:   . Days of Exercise per Week:   . Minutes of Exercise per Session:   Stress:   . Feeling of Stress :   Social Connections:   . Frequency of Communication with Friends and Family:   . Frequency of Social Gatherings with Friends and Family:   . Attends Religious Services:   . Active Member of Clubs or Organizations:   . Attends Archivist Meetings:   Marland Kitchen Marital Status:    Allergies  Allergen Reactions  . Latex     Causes swelling around face  . Penicillins     Hives  . Ciprofloxacin     Itching and hives  . Codeine     Itching(note: She has taken codeine containing cough preparations since)   Family History    Problem Relation Age of Onset  . Other Mother        Prediabetes  . Colitis Mother   . Pneumonia Father   . Arthritis Sister   . Heart attack Paternal Grandmother 54  . Heart attack Maternal Uncle 67  . Colon cancer Neg Hx      Current Outpatient Medications (Cardiovascular):  .  ezetimibe (ZETIA) 10 MG tablet, TAKE 1 TABLET BY MOUTH EVERY DAY  Current Outpatient Medications (Respiratory):  .  cetirizine (ZYRTEC) 10 MG tablet, TAKE 1 TABLET BY MOUTH EVERY DAY  Current Outpatient Medications (Analgesics):  Marland Kitchen  Acetaminophen (TYLENOL EXTRA STRENGTH PO), Take by mouth. Take one prn .  aspirin EC 325 MG tablet, Take 325 mg by mouth as needed.   Current Outpatient Medications (Other):  .  calcium carbonate (CALCIUM 600) 600 MG TABS tablet, Take 1 tablet (600 mg total) by mouth daily with breakfast. .  calcium carbonate (TUMS - DOSED IN MG ELEMENTAL CALCIUM) 500 MG chewable tablet, Chew 1 tablet by mouth as needed for indigestion or heartburn. .  chlorhexidine (PERIDEX) 0.12 % solution, SMARTSIG:0.5 Ounce(s) By Mouth Twice Daily .  Cholecalciferol (VITAMIN D) 50 MCG (2000 UT) CAPS, 1 cap daily .  cyclobenzaprine (FLEXERIL) 5 MG tablet, Take 1 tablet (5 mg total) by mouth at bedtime as needed for muscle spasms. .  Melatonin 10 MG CAPS, Take by mouth as needed. .  Multiple Vitamins-Minerals (WOMENS MULTI VITAMIN & MINERAL PO), Take by mouth. Centrum Womens MVI-Take one daily .  sertraline (ZOLOFT) 50 MG tablet, Take 1 tablet (50 mg total) by mouth at bedtime.   Reviewed prior external information including notes and imaging from  primary care provider As well as notes that were available from care everywhere and other healthcare systems.  Past medical history, social, surgical and family history all reviewed in electronic medical record.  No pertanent information unless stated regarding to the chief complaint.   Review of Systems:  No headache, visual changes, nausea, vomiting,  diarrhea, constipation, dizziness, abdominal pain, skin rash, fevers, chills, night sweats, weight loss, swollen lymph nodes, body aches, joint swelling, chest pain, shortness of breath, mood changes. POSITIVE muscle aches  Objective  There were no vitals taken for this visit.   General: No apparent distress alert and oriented x3 mood and affect normal, dressed appropriately.  HEENT: Pupils equal, extraocular movements intact  Respiratory: Patient's speak in full sentences and does not appear short of breath  Cardiovascular: No lower extremity edema, non tender, no erythema  Neuro: Cranial nerves II through XII are intact, neurovascularly intact in all extremities with 2+ DTRs  and 2+ pulses.  Gait normal with good balance and coordination.  MSK: Bilateral shoulder still show that patient has positive impingement but improvement in range of motion.  4+ out of 5 strength of the rotator cuffs but symmetric.    Wrist: Left Inspection mild swelling on the dorsal aspect of the wrist but does seem to be improved Patient still has some decreased range of motion secondary to some mild arthritic changes in the wrist patient is more tender still with the third compartment of the wrist No snuffbox tenderness. No tenderness over Canal of [place]. Strength 5/5 in all directions mild pain noted with ulnar deviation Negative Finkelstein, tinel's and phalens. Negative Watson's test.     Impression and Recommendations:     The above documentation has been reviewed and is accurate and complete Lyndal Pulley, DO       Note: This dictation was prepared with Dragon dictation along with smaller phrase technology. Any transcriptional errors that result from this process are unintentional.

## 2020-05-16 NOTE — Assessment & Plan Note (Signed)
Bilateral injections given recent improvement but continues to have some difficulty and will refer to formal physical therapy.  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  Worsening pain and possible MRI is necessary but hopefully patient will do well.  Discussed some laboratory work-up which we did add inflammatory markers to some of patient's primary care labs.  Follow-up with me again in 6 to 8 weeks

## 2020-05-16 NOTE — Addendum Note (Signed)
Addended by: Steward Ros on: 05/16/2020 01:41 PM   Modules accepted: Orders

## 2020-05-16 NOTE — Patient Instructions (Addendum)
Good to see you Prednisone 20 mg start 40 mg tomorrow PT [address] for wrist Wrist brace some during the day and at night Labs today  See me again in 6 weeks

## 2020-05-17 LAB — LIPID PANEL
Cholesterol: 259 mg/dL — ABNORMAL HIGH (ref <200)
HDL: 44 mg/dL — ABNORMAL LOW (ref >=50)
Non-HDL Cholesterol (Calc): 215 mg/dL (calc) — ABNORMAL HIGH (ref <130)
Total CHOL/HDL Ratio: 5.9 (calc) — ABNORMAL HIGH (ref <5.0)
Triglycerides: 491 mg/dL — ABNORMAL HIGH (ref <150)

## 2020-05-17 LAB — HIGH SENSITIVITY CRP: hs-CRP: >10 mg/L — ABNORMAL HIGH

## 2020-05-17 LAB — VITAMIN D 25 HYDROXY (VIT D DEFICIENCY, FRACTURES): Vit D, 25-Hydroxy: 31 ng/mL (ref 30–100)

## 2020-05-18 ENCOUNTER — Other Ambulatory Visit: Payer: Self-pay | Admitting: Internal Medicine

## 2020-05-18 ENCOUNTER — Ambulatory Visit: Payer: Medicare HMO | Admitting: Family Medicine

## 2020-05-18 MED ORDER — ROSUVASTATIN CALCIUM 5 MG PO TABS: 5.0000 mg | ORAL_TABLET | ORAL | 3 refills | Status: DC

## 2020-05-18 NOTE — Progress Notes (Signed)
cr

## 2020-06-09 ENCOUNTER — Other Ambulatory Visit: Payer: Self-pay | Admitting: Internal Medicine

## 2020-06-28 ENCOUNTER — Other Ambulatory Visit: Payer: Self-pay | Admitting: Internal Medicine

## 2020-07-02 ENCOUNTER — Other Ambulatory Visit: Payer: Self-pay | Admitting: Internal Medicine

## 2020-07-03 ENCOUNTER — Ambulatory Visit: Payer: Medicare HMO | Admitting: Family Medicine

## 2020-07-03 ENCOUNTER — Other Ambulatory Visit: Payer: Self-pay

## 2020-07-03 ENCOUNTER — Encounter: Payer: Self-pay | Admitting: Family Medicine

## 2020-07-03 VITALS — BP 128/70 | HR 75 | Ht 62.0 in | Wt 180.0 lb

## 2020-07-03 DIAGNOSIS — M2550 Pain in unspecified joint: Secondary | ICD-10-CM | POA: Diagnosis not present

## 2020-07-03 DIAGNOSIS — M25512 Pain in left shoulder: Secondary | ICD-10-CM | POA: Diagnosis not present

## 2020-07-03 DIAGNOSIS — G8929 Other chronic pain: Secondary | ICD-10-CM

## 2020-07-03 DIAGNOSIS — M778 Other enthesopathies, not elsewhere classified: Secondary | ICD-10-CM | POA: Diagnosis not present

## 2020-07-03 DIAGNOSIS — M25511 Pain in right shoulder: Secondary | ICD-10-CM | POA: Diagnosis not present

## 2020-07-03 NOTE — Patient Instructions (Addendum)
Labs today  Keep watching it give another 5-6 weeks for exercises If issues at follow up we will talk about imaging or injection

## 2020-07-03 NOTE — Assessment & Plan Note (Signed)
Patient continues to have more of a tendinitis noted of the wrist.  We discussed with patient in great length.  We do see noted patient continues to have calcific changes in the wrist area itself.  Patient did have bilateral shoulder and a little concern for the possibility for autoimmune or polymyalgia rheumatica electively removed secondary to the proximal weakness of the shoulders initially.  I do believe patient is getting better at this time and does have some underlying arthritic changes.  We discussed the potential for injections and patient never did follow through with the physical therapy.  Patient at this point would like to continue with conservative therapy for another 6 weeks.  At that time if continuing to have pain I would like to consider the possibility of injections or advanced imaging of the wrist

## 2020-07-03 NOTE — Progress Notes (Signed)
[place] [address] [place] Phone: [phone] Subjective:   I Pam Butler am serving as a Education administrator for Dr. Hulan Saas.  This visit occurred during the SARS-CoV-2 public health emergency.  Safety protocols were in place, including screening questions prior to the visit, additional usage of staff PPE, and extensive cleaning of exam room while observing appropriate contact time as indicated for disinfecting solutions.   I'm seeing this patient by the request  of:  [name], MD  CC: Bilateral shoulder pain and left wrist pain follow-up  [id number]   05/16/2020 Patient continues to have what seems to be more of a tendinitis.  We will start with formal physical therapy.  X-ray showed very minimal arthritic changes at this time.  May need consider the possibility of injection if this does not make improvement.  Otherwise we will need to consider advanced imaging.  Patient is in agreement with the plan and will follow up again in 6 to 8 weeks.  Inflammatory markers ordered today as well  Bilateral injections given recent improvement but continues to have some difficulty and will refer to formal physical therapy.  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  Worsening pain and possible MRI is necessary but hopefully patient will do well.  Discussed some laboratory work-up which we did add inflammatory markers to some of patient's primary care labs.  Follow-up with me again in 6 to 8 weeks  Update 07/03/2020 Pam Butler is a 74 y.o. female coming in with complaint of left wrist and bilateral shoulder pain. Patient states her shoulders are making improvements. ROM increased. Left wrist is still painful. Pushing up on her hand hurts her wrist.  Patient states that daily activities though she does relatively well most of the time.  Continues to try to stay as active as possible.  Patient denies any significant nighttime awakening secondary  to the pain.  Patient at last exam was given the injections.  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 Allergy   . Anxiety   . Cataract    Bil  . Depression   . History of bloody stools    in past/  . History of muscle pain    Bil pain in bil arms and shoulders/ chronic since last year/left shoulder started this year  . Loose stools    on and off/occasional blood in stool   Past Surgical History:  Procedure Laterality Date  . ABDOMINAL HYSTERECTOMY     with bladder tack  . APPENDECTOMY    . BREAST BIOPSY Right 2001   benign  . CHOLECYSTECTOMY    . COLONOSCOPY    . TUBAL LIGATION     Social History   Socioeconomic History  . Marital status: Married    Spouse name: Not on file  . Number of children: 2  . Years of education: Not on file  . Highest education level: Not on file  Occupational History  . Occupation: retired  Tobacco Use  . Smoking status: Never Smoker  . Smokeless tobacco: Never Used  Vaping Use  . Vaping Use: Never used  Substance and Sexual Activity  . Alcohol use: No    Alcohol/week: 0.0 standard drinks  . Drug use: No  . Sexual activity: Never  Other Topics Concern  . Not on file  Social History Narrative  Exercise: none   Social Determinants of Health   Financial Resource Strain:   . Difficulty of Paying Living Expenses: Not on file  Food Insecurity:   . Worried About Charity fundraiser in the Last Year: Not on file  . Ran Out of Food in the Last Year: Not on file  Transportation Needs:   . Lack of Transportation (Medical): Not on file  . Lack of Transportation (Non-Medical): Not on file  Physical Activity:   . Days of Exercise per Week: Not on file  . Minutes of Exercise per Session: Not on file  Stress:   . Feeling of Stress : Not on file  Social Connections:   . Frequency of Communication with Friends and Family: Not on file  .  Frequency of Social Gatherings with Friends and Family: Not on file  . Attends Religious Services: Not on file  . Active Member of Clubs or Organizations: Not on file  . Attends Archivist Meetings: Not on file  . Marital Status: Not on file   Allergies  Allergen Reactions  . Latex     Causes swelling around face  . Penicillins     Hives  . Ciprofloxacin     Itching and hives  . Codeine     Itching(note: She has taken codeine containing cough preparations since)   Family History  Problem Relation Age of Onset  . Other Mother        Prediabetes  . Colitis Mother   . Pneumonia Father   . Arthritis Sister   . Heart attack Paternal Grandmother 33  . Heart attack Maternal Uncle 67  . Colon cancer Neg Hx     Current Outpatient Medications (Endocrine & Metabolic):  .  predniSONE (DELTASONE) 20 MG tablet, Take 1 tablet (20 mg total) by mouth 2 (two) times daily.  Current Outpatient Medications (Cardiovascular):  .  ezetimibe (ZETIA) 10 MG tablet, TAKE 1 TABLET BY MOUTH EVERY DAY .  rosuvastatin (CRESTOR) 5 MG tablet, TAKE 1 TABLET (5 MG TOTAL) BY MOUTH 3 (THREE) TIMES A WEEK.  Current Outpatient Medications (Respiratory):  .  cetirizine (ZYRTEC) 10 MG tablet, TAKE 1 TABLET BY MOUTH EVERY DAY  Current Outpatient Medications (Analgesics):  Marland Kitchen  Acetaminophen (TYLENOL EXTRA STRENGTH PO), Take by mouth. Take one prn .  aspirin EC 325 MG tablet, Take 325 mg by mouth as needed.   Current Outpatient Medications (Other):  .  calcium carbonate (CALCIUM 600) 600 MG TABS tablet, Take 1 tablet (600 mg total) by mouth daily with breakfast. .  calcium carbonate (TUMS - DOSED IN MG ELEMENTAL CALCIUM) 500 MG chewable tablet, Chew 1 tablet by mouth as needed for indigestion or heartburn. .  chlorhexidine (PERIDEX) 0.12 % solution, SMARTSIG:0.5 Ounce(s) By Mouth Twice Daily .  Cholecalciferol (VITAMIN D) 50 MCG (2000 UT) CAPS, 1 cap daily .  cyclobenzaprine (FLEXERIL) 5 MG tablet, Take  1 tablet (5 mg total) by mouth at bedtime as needed for muscle spasms. .  Melatonin 10 MG CAPS, Take by mouth as needed. .  Multiple Vitamins-Minerals (WOMENS MULTI VITAMIN & MINERAL PO), Take by mouth. Centrum Womens MVI-Take one daily .  sertraline (ZOLOFT) 50 MG tablet, TAKE 1 TABLET BY MOUTH EVERYDAY AT BEDTIME   Reviewed prior external information including notes and imaging from  primary care provider As well as notes that were available from care everywhere and other healthcare systems.  Past medical history, social, surgical and family history all reviewed in electronic  medical record.  No pertanent information unless stated regarding to the chief complaint.   Review of Systems:  No headache, visual changes, nausea, vomiting, diarrhea, constipation, dizziness, abdominal pain, skin rash, fevers, chills, night sweats, weight loss, swollen lymph nodes, body aches, positive muscle aches , body aches  Objective  Blood pressure 128/70, pulse 75, height 5\' 2"  (1.575 m), weight 180 lb (81.6 kg), SpO2 97 %.   General: No apparent distress alert and oriented x3 mood and affect normal, dressed appropriately.  Musculoskeletal exam shows the patient does have arthritic changes in multiple joints. Shoulders bilaterally does have some mild atrophy.  Significant improvement though in active range of motion.  Still has some crepitus.  Positive crossover and positive impingement noted.   Left wrist exam still has some mild swelling of the dorsal aspect of the wrist.  Patient does have very mild limited range of motion in extension.  Patient does have good grip strength noted.  Neurovascularly intact distally.  Mild pain over the TFCC.   Impression and Recommendations:     The above documentation has been reviewed and is accurate and complete Lyndal Pulley, DO       Note: This dictation was prepared with Dragon dictation along with smaller phrase technology. Any transcriptional errors that  result from this process are unintentional.

## 2020-07-03 NOTE — Assessment & Plan Note (Signed)
Patient did respond very well to the injections.  Does have known arthritic changes.  Mostly of the acromioclavicular joint.  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  Is making improvement and will continue to monitor follow-up again in 6 weeks.  Differential does include the possibility of polymyalgia rheumatica laboratory work-up ordered today.

## 2020-07-04 LAB — SEDIMENTATION RATE: Sed Rate: 9 mm/h (ref 0–30)

## 2020-07-06 LAB — CBC WITH DIFFERENTIAL/PLATELET
Absolute Monocytes: 871 cells/uL (ref 200–950)
Basophils Absolute: 53 cells/uL (ref 0–200)
Basophils Relative: 0.6 %
Eosinophils Absolute: 229 cells/uL (ref 15–500)
Eosinophils Relative: 2.6 %
HCT: 37.3 % (ref 35.0–45.0)
Hemoglobin: 12.9 g/dL (ref 11.7–15.5)
Lymphs Abs: 1954 cells/uL (ref 850–3900)
MCH: 31 pg (ref 27.0–33.0)
MCHC: 34.6 g/dL (ref 32.0–36.0)
MCV: 89.7 fL (ref 80.0–100.0)
MPV: 9.9 fL (ref 7.5–12.5)
Monocytes Relative: 9.9 %
Neutro Abs: 5694 cells/uL (ref 1500–7800)
Neutrophils Relative %: 64.7 %
Platelets: 276 10*3/uL (ref 140–400)
RBC: 4.16 10*6/uL (ref 3.80–5.10)
RDW: 12.9 % (ref 11.0–15.0)
Total Lymphocyte: 22.2 %
WBC: 8.8 10*3/uL (ref 3.8–10.8)

## 2020-07-06 LAB — COMPREHENSIVE METABOLIC PANEL
AG Ratio: 2 (calc) (ref 1.0–2.5)
ALT: 21 U/L (ref 6–29)
AST: 25 U/L (ref 10–35)
Albumin: 4.9 g/dL (ref 3.6–5.1)
Alkaline phosphatase (APISO): 56 U/L (ref 37–153)
BUN/Creatinine Ratio: 15 (calc) (ref 6–22)
BUN: 17 mg/dL (ref 7–25)
CO2: 26 mmol/L (ref 20–32)
Calcium: 10.5 mg/dL — ABNORMAL HIGH (ref 8.6–10.4)
Chloride: 104 mmol/L (ref 98–110)
Creat: 1.12 mg/dL — ABNORMAL HIGH (ref 0.60–0.93)
Globulin: 2.5 g/dL (calc) (ref 1.9–3.7)
Glucose, Bld: 86 mg/dL (ref 65–99)
Potassium: 4.4 mmol/L (ref 3.5–5.3)
Sodium: 139 mmol/L (ref 135–146)
Total Bilirubin: 0.4 mg/dL (ref 0.2–1.2)
Total Protein: 7.4 g/dL (ref 6.1–8.1)

## 2020-07-06 LAB — C-REACTIVE PROTEIN: CRP: 9.9 mg/L — ABNORMAL HIGH (ref <8.0)

## 2020-07-06 LAB — FERRITIN: Ferritin: 177 ng/mL (ref 16–288)

## 2020-07-06 LAB — URIC ACID: Uric Acid, Serum: 5.9 mg/dL (ref 2.5–7.0)

## 2020-07-06 LAB — ANA: Anti Nuclear Antibody (ANA): POSITIVE — AB

## 2020-07-06 LAB — CK: Total CK: 59 U/L (ref 29–143)

## 2020-07-06 LAB — ANTI-NUCLEAR AB-TITER (ANA TITER): ANA Titer 1: 1:160 {titer} — ABNORMAL HIGH

## 2020-07-06 LAB — PTH, INTACT AND CALCIUM
Calcium: 10.5 mg/dL — ABNORMAL HIGH (ref 8.6–10.4)
PTH: 21 pg/mL (ref 14–64)

## 2020-07-06 LAB — CALCIUM, IONIZED: Calcium, Ion: 5.59 mg/dL (ref 4.8–5.6)

## 2020-08-14 ENCOUNTER — Ambulatory Visit: Payer: Medicare HMO | Admitting: Family Medicine

## 2020-08-14 ENCOUNTER — Ambulatory Visit: Payer: Self-pay

## 2020-08-14 ENCOUNTER — Encounter: Payer: Self-pay | Admitting: Family Medicine

## 2020-08-14 ENCOUNTER — Other Ambulatory Visit: Payer: Self-pay

## 2020-08-14 VITALS — BP 102/80 | HR 62 | Ht 62.0 in | Wt 176.0 lb

## 2020-08-14 DIAGNOSIS — M2550 Pain in unspecified joint: Secondary | ICD-10-CM

## 2020-08-14 DIAGNOSIS — G8929 Other chronic pain: Secondary | ICD-10-CM | POA: Diagnosis not present

## 2020-08-14 DIAGNOSIS — R7982 Elevated C-reactive protein (CRP): Secondary | ICD-10-CM

## 2020-08-14 DIAGNOSIS — M778 Other enthesopathies, not elsewhere classified: Secondary | ICD-10-CM | POA: Diagnosis not present

## 2020-08-14 DIAGNOSIS — R768 Other specified abnormal immunological findings in serum: Secondary | ICD-10-CM | POA: Diagnosis not present

## 2020-08-14 DIAGNOSIS — M25512 Pain in left shoulder: Secondary | ICD-10-CM

## 2020-08-14 DIAGNOSIS — M25511 Pain in right shoulder: Secondary | ICD-10-CM

## 2020-08-14 DIAGNOSIS — M25532 Pain in left wrist: Secondary | ICD-10-CM

## 2020-08-14 LAB — C-REACTIVE PROTEIN: CRP: 1 mg/dL (ref 0.5–20.0)

## 2020-08-14 NOTE — Patient Instructions (Addendum)
Good to see you Pam Butler for wrist and shoulder pain Labs today Take time for yourself I know it is stressful See me again in 7-8 weeks

## 2020-08-14 NOTE — Progress Notes (Signed)
[facility] [address] [place] Phone: [phone] Subjective:   I [name] am serving as a Education administrator for Dr. Hulan Saas.  This visit occurred during the SARS-CoV-2 public health emergency.  Safety protocols were in place, including screening questions prior to the visit, additional usage of staff PPE, and extensive cleaning of exam room while observing appropriate contact time as indicated for disinfecting solutions.   I'm seeing this patient by the request  of:  [name], MD  CC: Bilateral shoulder pain and wrist pain follow-up  [id number]   07/03/2020 Patient did respond very well to the injections.  Does have known arthritic changes.  Mostly of the acromioclavicular joint.  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  Is making improvement and will continue to monitor follow-up again in 6 weeks.  Differential does include the possibility of polymyalgia rheumatica laboratory work-up ordered today.  Patient continues to have more of a tendinitis noted of the wrist.  We discussed with patient in great length.  We do see noted patient continues to have calcific changes in the wrist area itself.  Patient did have bilateral shoulder and a little concern for the possibility for autoimmune or polymyalgia rheumatica electively removed secondary to the proximal weakness of the shoulders initially.  I do believe patient is getting better at this time and does have some underlying arthritic changes.  We discussed the potential for injections and patient never did follow through with the physical therapy.  Patient at this point would like to continue with conservative therapy for another 6 weeks.  At that time if continuing to have pain I would like to consider the possibility of injections or advanced imaging of the wrist  Update 08/14/2020 LALANIA HASEMAN is a 74 y.o. female  coming in with complaint of bilateral shoulder and left wrist pain. Patient states the shoulders are painful when she is active. Difficulty doing ADLs. Wrist will catch and doesn't hurt unless she does something wrong.  Patient states that she does feel like she is about 50 to 60% better at this time since the injections and doing the exercises. Patient also had laboratory work-up at last exam that did show that patient had hypercalcemia as well as an elevated CRP.  Unable to contact patient because her phone number was wrong.  Positive ANA with a 1-160 titer     Past Medical History:  Diagnosis Date  . Allergy   . Anxiety   . Cataract    Bil  . Depression   . History of bloody stools    in past/  . History of muscle pain    Bil pain in bil arms and shoulders/ chronic since last year/left shoulder started this year  . Loose stools    on and off/occasional blood in stool   Past Surgical History:  Procedure Laterality Date  . ABDOMINAL HYSTERECTOMY     with bladder tack  . APPENDECTOMY    . BREAST BIOPSY Right 2001   benign  . CHOLECYSTECTOMY    . COLONOSCOPY    . TUBAL LIGATION     Social History   Socioeconomic History  . Marital status: Married    Spouse name: Not on file  . Number of children: 2  . Years of education: Not on file  . Highest education level: Not on file  Occupational History  . Occupation: retired  Tobacco Use  . Smoking status: Never Smoker  . Smokeless tobacco: Never Used  Vaping Use  . Vaping Use: Never used  Substance and Sexual Activity  . Alcohol use: No    Alcohol/week: 0.0 standard drinks  . Drug use: No  . Sexual activity: Never  Other Topics Concern  . Not on file  Social History Narrative   Exercise: none   Social Determinants of Health   Financial Resource Strain:   . Difficulty of Paying Living Expenses: Not on file  Food Insecurity:   . Worried About Charity fundraiser in the Last Year: Not on file  . Ran Out of Food in the  Last Year: Not on file  Transportation Needs:   . Lack of Transportation (Medical): Not on file  . Lack of Transportation (Non-Medical): Not on file  Physical Activity:   . Days of Exercise per Week: Not on file  . Minutes of Exercise per Session: Not on file  Stress:   . Feeling of Stress : Not on file  Social Connections:   . Frequency of Communication with Friends and Family: Not on file  . Frequency of Social Gatherings with Friends and Family: Not on file  . Attends Religious Services: Not on file  . Active Member of Clubs or Organizations: Not on file  . Attends Archivist Meetings: Not on file  . Marital Status: Not on file   Allergies  Allergen Reactions  . Latex     Causes swelling around face  . Penicillins     Hives  . Ciprofloxacin     Itching and hives  . Codeine     Itching(note: She has taken codeine containing cough preparations since)   Family History  Problem Relation Age of Onset  . Other Mother        Prediabetes  . Colitis Mother   . Pneumonia Father   . Arthritis Sister   . Heart attack Paternal Grandmother 68  . Heart attack Maternal Uncle 67  . Colon cancer Neg Hx     Current Outpatient Medications (Endocrine & Metabolic):  .  predniSONE (DELTASONE) 20 MG tablet, Take 1 tablet (20 mg total) by mouth 2 (two) times daily.  Current Outpatient Medications (Cardiovascular):  .  ezetimibe (ZETIA) 10 MG tablet, TAKE 1 TABLET BY MOUTH EVERY DAY .  rosuvastatin (CRESTOR) 5 MG tablet, TAKE 1 TABLET (5 MG TOTAL) BY MOUTH 3 (THREE) TIMES A WEEK.  Current Outpatient Medications (Respiratory):  .  cetirizine (ZYRTEC) 10 MG tablet, TAKE 1 TABLET BY MOUTH EVERY DAY  Current Outpatient Medications (Analgesics):  Marland Kitchen  Acetaminophen (TYLENOL EXTRA STRENGTH PO), Take by mouth. Take one prn .  aspirin EC 325 MG tablet, Take 325 mg by mouth as needed.   Current Outpatient Medications (Other):  .  calcium carbonate (CALCIUM 600) 600 MG TABS tablet,  Take 1 tablet (600 mg total) by mouth daily with breakfast. .  calcium carbonate (TUMS - DOSED IN MG ELEMENTAL CALCIUM) 500 MG chewable tablet, Chew 1 tablet by mouth as needed for indigestion or heartburn. .  chlorhexidine (PERIDEX) 0.12 % solution, SMARTSIG:0.5 Ounce(s) By Mouth Twice Daily .  Cholecalciferol (VITAMIN D) 50 MCG (2000 UT) CAPS, 1 cap daily .  cyclobenzaprine (FLEXERIL) 5 MG tablet, Take 1 tablet (5 mg total) by mouth at bedtime as needed for muscle spasms. .  Melatonin 10 MG CAPS, Take by mouth as needed. .  Multiple Vitamins-Minerals (WOMENS MULTI VITAMIN & MINERAL PO),  Take by mouth. Centrum Womens MVI-Take one daily .  sertraline (ZOLOFT) 50 MG tablet, TAKE 1 TABLET BY MOUTH EVERYDAY AT BEDTIME   Reviewed prior external information including notes and imaging from  primary care provider As well as notes that were available from care everywhere and other healthcare systems.  Past medical history, social, surgical and family history all reviewed in electronic medical record.  No pertanent information unless stated regarding to the chief complaint.   Review of Systems:  No headache, visual changes, nausea, vomiting, diarrhea, constipation, dizziness, abdominal pain, skin rash, fevers, chills, night sweats, weight loss, swollen lymph nodes, body aches, joint swelling, chest pain, shortness of breath, mood changes. POSITIVE muscle aches  Objective  Blood pressure 102/80, pulse 62, height 5\' 2"  (1.575 m), weight 176 lb (79.8 kg), SpO2 97 %.   General: No apparent distress alert and oriented x3 mood and affect normal, dressed appropriately.  Patient is very anxious HEENT: Pupils equal, extraocular movements intact  Respiratory: Patient's speak in full sentences and does not appear short of breath  Cardiovascular: No lower extremity edema, non tender, no erythema  Arthritic changes of multiple joints.  Patient does have some mild weakness of the right rotator cuff still noted.   Some mild limited range of motion in all planes. Patient does have have some mild improvement in range of motion of the left shoulder for sure.  Very mild positive impingement noted.  Left wrist exam shows the patient does have some mild swelling noted over the dorsal aspect of wrist.  Improvement in range of motion though.  No erythema or warmness to the wrist.  Good grip strength noted.  Limited musculoskeletal ultrasound was performed and interpreted by Lyndal Pulley  Limited ultrasound of patient's left wrist shows the patient still has some mild hypoechoic changes within the third dorsal compartment of the wrist.  Of note significant improvement from previous exam with no increase in Doppler flow.  Significant decrease in calcific changes that was noted as well. Impression: Improvement of the wrist tendinitis.   Impression and Recommendations:     The above documentation has been reviewed and is accurate and complete Lyndal Pulley, DO

## 2020-08-14 NOTE — Assessment & Plan Note (Addendum)
Patient responded somewhat to the injections previously.  We will hold on injections today and start with formal physical therapy.  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  Patient has not been able to care for herself for quite some time.  We did spend a significant amount of time greater than 35 minutes talking about her family.  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  Also patient's granddaughters were in a very serious motor vehicle accident recently and she has had a lot of stress with this.  Also patient's husband has had had surgical intervention recently.  Multiple other things have caused her to not be able to focus on herself.  Patient is going to start with the physical therapy where patient can actually focus on herself at this time.  Follow-up with me again in 7 to 8 weeks.  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

## 2020-08-14 NOTE — Assessment & Plan Note (Signed)
Improvement noted at this time with more the bracing.  Start with formal physical therapy.  Patient does have some elevated calcium level does not like to recheck at this time as well.  Discussed icing regimen.  Follow-up with me again in 4 to 8 weeks

## 2020-08-14 NOTE — Assessment & Plan Note (Signed)
Recheck CRP 1 more time.

## 2020-08-14 NOTE — Assessment & Plan Note (Signed)
Patient would like to hold on referral to rheumatology at this time once again secondary to her other social determinants of health and taking care of family members including her mother.

## 2020-08-14 NOTE — Assessment & Plan Note (Signed)
Hypercalcemia at this point.  We will recheck and see if anything else is still elevated.

## 2020-08-15 LAB — CALCIUM, IONIZED: Calcium, Ion: 4.9 mg/dL (ref 4.8–5.6)

## 2020-08-15 LAB — PTH, INTACT AND CALCIUM
Calcium: 9.5 mg/dL (ref 8.6–10.4)
PTH: 43 pg/mL (ref 14–64)

## 2020-08-22 ENCOUNTER — Ambulatory Visit: Payer: Medicare HMO | Admitting: Internal Medicine

## 2020-10-10 ENCOUNTER — Ambulatory Visit: Payer: Self-pay

## 2020-10-10 ENCOUNTER — Other Ambulatory Visit: Payer: Self-pay

## 2020-10-10 ENCOUNTER — Ambulatory Visit: Payer: Medicare HMO | Admitting: Family Medicine

## 2020-10-10 ENCOUNTER — Encounter: Payer: Self-pay | Admitting: Family Medicine

## 2020-10-10 ENCOUNTER — Ambulatory Visit (INDEPENDENT_AMBULATORY_CARE_PROVIDER_SITE_OTHER): Payer: Medicare HMO

## 2020-10-10 VITALS — BP 150/90 | HR 66 | Ht 62.0 in | Wt 172.0 lb

## 2020-10-10 DIAGNOSIS — M25512 Pain in left shoulder: Secondary | ICD-10-CM

## 2020-10-10 DIAGNOSIS — M778 Other enthesopathies, not elsewhere classified: Secondary | ICD-10-CM

## 2020-10-10 DIAGNOSIS — M25511 Pain in right shoulder: Secondary | ICD-10-CM

## 2020-10-10 DIAGNOSIS — G8929 Other chronic pain: Secondary | ICD-10-CM | POA: Diagnosis not present

## 2020-10-10 DIAGNOSIS — M542 Cervicalgia: Secondary | ICD-10-CM | POA: Diagnosis not present

## 2020-10-10 DIAGNOSIS — M25532 Pain in left wrist: Secondary | ICD-10-CM

## 2020-10-10 DIAGNOSIS — R768 Other specified abnormal immunological findings in serum: Secondary | ICD-10-CM

## 2020-10-10 DIAGNOSIS — M7989 Other specified soft tissue disorders: Secondary | ICD-10-CM | POA: Diagnosis not present

## 2020-10-10 NOTE — Assessment & Plan Note (Addendum)
Patient has had this previously positive.  We have discussed the referral to rheumatology again.  Patient wants to continue with what she is doing at this time.  I do feel that further work-up may be necessary and has had signs and symptoms consistent with an polymyalgia rheumatica.  Total time with patient today 33 minutes

## 2020-10-10 NOTE — Assessment & Plan Note (Addendum)
Continued swelling noted of the tendon at this time.  We discussed the hypoechoic changes and increasing in Doppler flow.  Patient still does not want an injection and has been noncompliant on the home exercises.  Patient did have a lot of personal things going on.  Encouraged her to start with physical therapy again.  Patient will increase activity slowly. She declined MRI as well   Follow-up with me again 7 to 8 weeks.

## 2020-10-10 NOTE — Progress Notes (Signed)
[facility] [address] [place] [address] Phone: [phone] Subjective:   I Pam Butler am serving as a Neurosurgeon for Dr. Antoine Primas.  This visit occurred during the SARS-CoV-2 public health emergency.  Safety protocols were in place, including screening questions prior to the visit, additional usage of staff PPE, and extensive cleaning of exam room while observing appropriate contact time as indicated for disinfecting solutions.   I'm seeing this patient by the request  of:  Pincus Sanes, MD  CC: Wrist pain shoulder pain follow-up  [id number]   08/14/2020 Patient responded somewhat to the injections previously.  We will hold on injections today and start with formal physical therapy.  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  Patient has not been able to care for herself for quite some time.  We did spend a significant amount of time greater than 35 minutes talking about her family.  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  Also patient's granddaughters were in a very serious motor vehicle accident recently and she has had a lot of stress with this.  Also patient's husband has had had surgical intervention recently.  Multiple other things have caused her to not be able to focus on herself.  Patient is going to start with the physical therapy where patient can actually focus on herself at this time.  Follow-up with me again in 7 to 8 weeks.  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   Update 10/10/2020 Pam Butler is a 74 y.o. female coming in with complaint of bilateral shoulder pain. Patient states her shoulders are  painful. Left hand/ wrist is getting worse and the pain radiates down the forearm. Tinging in the finger tips. Supination is more painful. States the pain is sharp.  Patient has been noncompliant.  Did not start with formal physical therapy, has not been doing the home exercises and did help her son move.  Patient did have a lot of health issues initially.  This was more of her family members and did not have the chance to take care of herself. Patient understands that she is somewhat her own worsening.  He knows she needs to be doing more for herself and feels like she is now at a place where she can do that     Past Medical History:  Diagnosis Date  . Allergy   . Anxiety   . Cataract    Bil  . Depression   . History of bloody stools    in past/  . History of muscle pain    Bil pain in bil arms and shoulders/ chronic since last year/left shoulder started this year  . Loose stools    on and off/occasional blood in stool   Past Surgical History:  Procedure Laterality Date  . ABDOMINAL HYSTERECTOMY     with bladder tack  . APPENDECTOMY    . BREAST BIOPSY Right 2001   benign  . CHOLECYSTECTOMY    . COLONOSCOPY    . TUBAL LIGATION     Social History   Socioeconomic History  . Marital status: Married    Spouse name: Not on file  .  Number of children: 2  . Years of education: Not on file  . Highest education level: Not on file  Occupational History  . Occupation: retired  Tobacco Use  . Smoking status: Never Smoker  . Smokeless tobacco: Never Used  Vaping Use  . Vaping Use: Never used  Substance and Sexual Activity  . Alcohol use: No    Alcohol/week: 0.0 standard drinks  . Drug use: No  . Sexual activity: Never  Other Topics Concern  . Not on file  Social History Narrative   Exercise: none   Social Determinants of Health   Financial Resource Strain: Not on file  Food Insecurity: Not on file  Transportation Needs: Not on file  Physical Activity: Not on file   Stress: Not on file  Social Connections: Not on file   Allergies  Allergen Reactions  . Latex     Causes swelling around face  . Penicillins     Hives  . Ciprofloxacin     Itching and hives  . Codeine     Itching(note: She has taken codeine containing cough preparations since)   Family History  Problem Relation Age of Onset  . Other Mother        Prediabetes  . Colitis Mother   . Pneumonia Father   . Arthritis Sister   . Heart attack Paternal Grandmother 47  . Heart attack Maternal Uncle 67  . Colon cancer Neg Hx     Current Outpatient Medications (Endocrine & Metabolic):  .  predniSONE (DELTASONE) 20 MG tablet, Take 1 tablet (20 mg total) by mouth 2 (two) times daily.  Current Outpatient Medications (Cardiovascular):  .  ezetimibe (ZETIA) 10 MG tablet, TAKE 1 TABLET BY MOUTH EVERY DAY .  rosuvastatin (CRESTOR) 5 MG tablet, TAKE 1 TABLET (5 MG TOTAL) BY MOUTH 3 (THREE) TIMES A WEEK.  Current Outpatient Medications (Respiratory):  .  cetirizine (ZYRTEC) 10 MG tablet, TAKE 1 TABLET BY MOUTH EVERY DAY  Current Outpatient Medications (Analgesics):  Marland Kitchen  Acetaminophen (TYLENOL EXTRA STRENGTH PO), Take by mouth. Take one prn .  aspirin EC 325 MG tablet, Take 325 mg by mouth as needed.   Current Outpatient Medications (Other):  .  calcium carbonate (CALCIUM 600) 600 MG TABS tablet, Take 1 tablet (600 mg total) by mouth daily with breakfast. .  calcium carbonate (TUMS - DOSED IN MG ELEMENTAL CALCIUM) 500 MG chewable tablet, Chew 1 tablet by mouth as needed for indigestion or heartburn. .  chlorhexidine (PERIDEX) 0.12 % solution, SMARTSIG:0.5 Ounce(s) By Mouth Twice Daily .  Cholecalciferol (VITAMIN D) 50 MCG (2000 UT) CAPS, 1 cap daily .  cyclobenzaprine (FLEXERIL) 5 MG tablet, Take 1 tablet (5 mg total) by mouth at bedtime as needed for muscle spasms. .  Melatonin 10 MG CAPS, Take by mouth as needed. .  Multiple Vitamins-Minerals (WOMENS MULTI VITAMIN & MINERAL PO), Take by  mouth. Centrum Womens MVI-Take one daily .  sertraline (ZOLOFT) 50 MG tablet, TAKE 1 TABLET BY MOUTH EVERYDAY AT BEDTIME   Reviewed prior external information including notes and imaging from  primary care provider As well as notes that were available from care everywhere and other healthcare systems.  Past medical history, social, surgical and family history all reviewed in electronic medical record.  No pertanent information unless stated regarding to the chief complaint.   Review of Systems:  No headache, visual changes, nausea, vomiting, diarrhea, constipation, dizziness, abdominal pain, skin rash, fevers, chills, night sweats, weight loss, swollen lymph nodes,  joint swelling, chest pain, shortness of breath, mood changes. POSITIVE muscle aches, body aches  Objective  Blood pressure (!) 150/90, pulse 66, height 5\' 2"  (1.575 m), weight 172 lb (78 kg), SpO2 98 %.   General: No apparent distress alert and oriented x3 mood and affect normal, dressed appropriately.  HEENT: Pupils equal, extraocular movements intact  Respiratory: Patient's speak in full sentences and does not appear short of breath  Cardiovascular: No lower extremity edema, non tender, no erythema  Patient's neck does have some mild loss of lordosis.  Bilateral shoulder still show that patient does have some limited range of motion.  Does have some weakness of the rotator cuff bilaterally.  Patient passively though does have fairly good range of motion with mild crepitus. Left wrist shows swelling again on the dorsal aspect of the wrist.  Negative Tinel's.  Pain no with extension of the wrist.  Patient does have some limited range of motion of the left wrist compared to previous exam cannot tell if it is secondary to voluntary guarding or just stiffness overall.  Limited musculoskeletal ultrasound was performed and interpreted by Lyndal Pulley  Limited ultrasound of patient's left wrist shows the patient has an increase  hypoechoic changes within the third and middle second and first compartments of the dorsal wrist.  Increase in Doppler flow as well.  Is consistent with more of a diffuse tendinitis. Impression: Worsening tendinitis of the wrist did not evaluate for underlying arthritis    Impression and Recommendations:     The above documentation has been reviewed and is accurate and complete Lyndal Pulley, DO

## 2020-10-10 NOTE — Assessment & Plan Note (Signed)
Patient has responded fairly well to the injections previously.  Still has some weakness.  Seems to be more of a rotator cuff syndrome.  We discussed advanced imaging but patient would not want any type of surgical intervention.  Has not been doing the home exercises and has been noncompliant otherwise.  Patient is encouraged to start with formal physical therapy and we will see how patient improves.  Follow-up with me again 6 to 8 weeks if no improvement I would like to consider nerve conduction tests and then potentially advanced imaging.

## 2020-10-10 NOTE — Patient Instructions (Addendum)
Good to see you So because you have not done anything we have suggested yet Lets do PT please call them to get it set up ASAP [phone] Voltaren Gel 2 times a day to the wrist Icing 20 mins 2 times a day I do want improvement of range of motion in wrist. Wear the brace less Left wrist and neck xray today See me again in 7-8 weeks if not improved may need to consider MRI of the wrist. We will check the shoulders

## 2020-10-11 ENCOUNTER — Other Ambulatory Visit: Payer: Self-pay

## 2020-10-11 DIAGNOSIS — M778 Other enthesopathies, not elsewhere classified: Secondary | ICD-10-CM

## 2020-10-11 DIAGNOSIS — G8929 Other chronic pain: Secondary | ICD-10-CM

## 2020-10-11 NOTE — Progress Notes (Signed)
Phone is disconnected.

## 2020-10-29 ENCOUNTER — Ambulatory Visit: Payer: Medicare HMO

## 2020-11-03 ENCOUNTER — Other Ambulatory Visit: Payer: Self-pay | Admitting: Internal Medicine

## 2020-11-09 ENCOUNTER — Telehealth: Payer: Self-pay | Admitting: Internal Medicine

## 2020-11-09 ENCOUNTER — Other Ambulatory Visit: Payer: Self-pay | Admitting: Internal Medicine

## 2020-11-09 NOTE — Progress Notes (Signed)
  Chronic Care Management   Note  11/09/2020 Name: Pam [facility] MRN: [id number] DOB: 06-07-1946  Pam Butler is a 75 y.o. year old female who is a primary care patient of Burns, Claudina Lick, MD. I reached out to Pam Butler by phone today in response to a referral sent by [name]'s PCP, [name], MD.   [name] was given information about Chronic Care Management services today including:  1. CCM service includes personalized support from designated clinical staff supervised by her physician, including individualized plan of care and coordination with other care providers 2. 24/7 contact phone numbers for assistance for urgent and routine care needs. 3. Service will only be billed when office clinical staff spend 20 minutes or more in a month to coordinate care. 4. Only one practitioner may furnish and bill the service in a calendar month. 5. The patient may stop CCM services at any time (effective at the end of the month) by phone call to the office staff.   Patient agreed to services and verbal consent obtained.   Follow up plan:   [name]

## 2020-11-13 ENCOUNTER — Other Ambulatory Visit: Payer: Self-pay

## 2020-11-13 ENCOUNTER — Encounter: Payer: Self-pay | Admitting: Physical Therapy

## 2020-11-13 ENCOUNTER — Ambulatory Visit: Payer: Medicare HMO | Attending: Family Medicine | Admitting: Physical Therapy

## 2020-11-13 DIAGNOSIS — G8929 Other chronic pain: Secondary | ICD-10-CM | POA: Diagnosis not present

## 2020-11-13 DIAGNOSIS — M25532 Pain in left wrist: Secondary | ICD-10-CM

## 2020-11-13 DIAGNOSIS — M25562 Pain in left knee: Secondary | ICD-10-CM | POA: Insufficient documentation

## 2020-11-13 NOTE — Therapy (Signed)
[place], [place], [address] Phone: [phone]   Fax:  [phone]  Physical Therapy Evaluation  Patient Details  Name: Pam Butler MRN: [id number] Date of Birth: 10-24-1945 Referring Provider (PT): Hulan Saas, DO   Encounter Date: 11/13/2020   PT End of Session - 11/13/20 1307    Visit Number 1    Number of Visits 12    Date for PT Re-Evaluation 12/25/20    Authorization Type [employer] MCR, progress note by visit 59, recheck FOTO by visit 6, no ionto    PT Start Time 1100    PT Stop Time 1147    PT Time Calculation (min) 47 min    Activity Tolerance Patient limited by pain    Behavior During Therapy [facility] for tasks assessed/performed           Past Medical History:  Diagnosis Date  . Allergy   . Anxiety   . Cataract    Bil  . Depression   . History of bloody stools    in past/  . History of muscle pain    Bil pain in bil arms and shoulders/ chronic since last year/left shoulder started this year  . Loose stools    on and off/occasional blood in stool    Past Surgical History:  Procedure Laterality Date  . ABDOMINAL HYSTERECTOMY     with bladder tack  . APPENDECTOMY    . BREAST BIOPSY Right 2001   benign  . CHOLECYSTECTOMY    . COLONOSCOPY    . TUBAL LIGATION      There were no vitals filed for this visit.    Subjective Assessment - 11/13/20 1253    Subjective Pt. is a 75 y/o female referred to PT for c/o chronic left wrist and shoulder pain. For wrist she reports initial onset over a year ago-she has chronic shoulder pain issues bilaterally and strained her wrist with compensatory excessive wrist extension while trying to reach her shoulders up for dressing. Pain has persisted with primary symptoms on dorsal aspect of left wrist but also extending into extensor region of forearm. She also notes pain at times along ventral aspect of wrist as well. She reports wrist/arm feels like it's  "waking up" after arm asleep at times otherwise no overt parasthesias noted. US revealed tendinitis-see chart copy of [place] as well as X-ray reports. For knee pt. reports >1year history of insidious onset anterior knee pain. She had X-rays last year with minimal degenerative changes noted. Left knee pain is exacerbated with activities including stair navigation and prolonged walking.    Pertinent History chronic shoulder pain, history of anxiety and depression    Limitations Standing;Lifting;House hold activities;Walking    Diagnostic tests [place], X-rays    Patient Stated Goals Get wrist and knee better    Currently in Pain? Yes    Pain Score 2     Pain Location Wrist    Pain Orientation Left    Pain Descriptors / Indicators Sharp    Pain Type Chronic pain    Pain Onset More than a month ago    Pain Frequency Constant    Aggravating Factors  gripping, wrist use    Pain Relieving Factors rest    Effect of Pain on Daily Activities limits ability gripping activiites such as opening jars              [facility] PT Assessment - 11/13/20 0001      Assessment  Medical Diagnosis Left wrist pain, chronic left knee pain    Referring Provider (PT) Hulan Saas, DO    Onset Date/Surgical Date 11/14/19   estimated per subjective report of symptoms for at least a year   Hand Dominance Right    Next MD Visit 11/29/2019    Prior Therapy none      Precautions   Precautions None      Restrictions   Weight Bearing Restrictions No      Balance Screen   Has the patient fallen in the past 6 months No      [place] residence    Living Arrangements Spouse/significant other;Parent   assists her mother as caregiver   Type of [place] Access Level entry    Home Layout --   2 steps inside house     Prior Function   Level of Independence Independent with basic ADLs;Independent with community mobility without device      Cognition   Overall Cognitive Status  Within Functional Limits for tasks assessed      Observation/Other Assessments   Focus on Therapeutic Outcomes (FOTO)  50% function      Observation/Other Assessments-Edema    Edema Circumferential      Circumferential Edema   Circumferential - Right 43 cm   mid-patella   Circumferential - Left  45 cm   mid-patella     Sensation   Light Touch Appears Intact      ROM / Strength   AROM / PROM / Strength AROM;Strength      AROM   Overall AROM Comments left wrist AROM limited as noted with pain, bilateral hip AROM/PROM grossly WFL    AROM Assessment Site Wrist;Knee    Right/Left Wrist Right    Right Wrist Extension 82 Degrees    Right Wrist Flexion 85 Degrees    Left Wrist Extension 44 Degrees    Left Wrist Flexion 48 Degrees    Right/Left Knee Right;Left    Right Knee Extension 0    Right Knee Flexion 135    Left Knee Extension 0    Left Knee Flexion 125      Strength   Overall Strength Comments left wrist strength grossly 4/5 with pain with resisted wrist extension, right wrist strength grossly 5/5, left grip 7 lbs., right grip 40 lbs. with grip dynamonometer, bilat. elbow strength 5/5    Strength Assessment Site Hip;Knee    Right/Left Hip Right;Left    Right Hip Flexion 5/5    Right Hip Extension 4+/5    Right Hip External Rotation  5/5    Right Hip Internal Rotation 5/5    Right Hip ABduction 4+/5    Right Hip ADduction 5/5    Left Hip Flexion 4+/5    Left Hip Extension 4+/5    Left Hip External Rotation 5/5    Left Hip Internal Rotation 5/5    Left Hip ABduction 4/5    Left Hip ADduction 5/5    Right/Left Knee Right;Left    Right Knee Flexion 5/5    Right Knee Extension 5/5    Left Knee Flexion 5/5    Left Knee Extension 5/5      Flexibility   Soft Tissue Assessment /Muscle Length --   SLR 70 deg right, 80 deg left-pt. had noted some tightness/discomfort recently in right hamstring region     Special Tests   Other special tests attempted assessment of UE  neural tension but unable to perform ULTT due to shoulder ROM limitations                      Objective measurements completed on examination: See above findings.       [facility] Adult PT Treatment/Exercise - 11/13/20 0001      Exercises   Exercises --   HEP handout review-see chart copy                 PT Education - 11/13/20 1307    Education Details symptom etiology, pain education, brief HEP review, POC    Person(s) Educated Patient    Methods Explanation;Demonstration;Verbal cues;Handout    Comprehension Verbalized understanding            PT Short Term Goals - 11/13/20 1319      PT SHORT TERM GOAL #1   Title Independent with initial HEP    Baseline HEP handout at eval    Time 3    Period Weeks    Status New      PT SHORT TERM GOAL #2   Title Increase left grip strength to 15 lbs. or greater to improve ability for gripping for chores at home    Baseline 7 lbs.    Time 3    Period Weeks    Status New             PT Long Term Goals - 11/13/20 1320      PT LONG TERM GOAL #1   Title Improve FOTO outcome measure score to 63% or greater function    Baseline 50%    Time 6    Period Weeks    Status New    Target Date 12/25/20      PT LONG TERM GOAL #2   Title Left wrist strength at least grossly 4+/5 and left hip strength at leats 4+/5 to 5/5 to improve ability for gripping and lifing activities for chores and to improve ability for stair navigation    Baseline left wrist grossly 4/5, see objective for hip MMTs    Time 6    Period Weeks    Status New    Target Date 12/25/20      PT LONG TERM GOAL #3   Title Navigate stairs as needed for home and community mobility with left knee pain decreased at least 50% or greater from baseline    Baseline no pain at rest at eval but intermittently higher with activity    Time 6    Period Weeks    Status New    Target Date 12/25/20      PT LONG TERM GOAL #4   Title Perform gripping for  dressing, cooking and chores at home with left wrist pain 2/10 or less at worst    Baseline 2/10 pain at rest at eval but can increase to 8/10    Time 6    Period Weeks    Status New    Target Date 12/25/20                  Plan - 11/13/20 1308    Clinical Impression Statement Wrist: Pt. presents with chronic left wrist pain on dorsal>ventral side with associated left wrist and grip weakness and decreased wrist AROM. Presentation consistent with imaging findings of tendinopathy (wrist extensor). Also suspect potential neural tension as contributing factor to symptoms but unable to formally assess upper limb tension tests due to shoulder limitations  but including trial gentle nerve glides with HEP exercises to help address. Knee: Pt. presents with chronic left anterior knee pain-no significant OA per imaging and presentation consistent with patellofemoral pain with associated hip weakness and mild decrease in left knee ROM. 2 cm circumferential difference noted left>right-pt. reports she has had longstanding history of left LE edema so this may be contributing factor to ROM limitations. Pt. would benefit from PT to help relieve pain in both regions to address associated functional limitations.    Personal Factors and Comorbidities Comorbidity 2;Time since onset of injury/illness/exacerbation   multiple tx. areas   Comorbidities see PMH    Examination-Activity Limitations Dressing;Bathing;Lift;Locomotion Level;Stairs;Stand    Examination-Participation Restrictions Meal Prep;Community Activity;Cleaning    Stability/Clinical Decision Making Evolving/Moderate complexity    Clinical Decision Making Moderate    Rehab Potential Good    PT Frequency 2x / week    PT Duration 6 weeks    PT Treatment/Interventions ADLs/Self Care Home Management;Cryotherapy;Ultrasound;Software engineer;Therapeutic exercise;Patient/family education;Manual techniques;Neuromuscular  re-education;Functional mobility training;Therapeutic activities;Taping;Dry needling    PT Next Visit Plan [employer]-no ionto, review FOTO patient report by visit 3,for wrist try manual/STM to wrist extensor region, gentle nerve glides pending tolerance for median and radial nerve (limited shoulder mobility), review HEP and continue/progress eccentric and AROM as tolerated, modalities prn, for knee continue/progress hip and knee strengthening as tolerated pending pain, if needed trial patellar taping    PT Home Exercise Plan Access code: [email or web address]: left wrist extension eccentric (red Theraband), wrist extensor stretch, left median and radial nerve glides (see HEP for variations given with minimal shoulder ROM needed), for knee: Theraband hip abd, supine SLR, hip bridge           Patient will benefit from skilled therapeutic intervention in order to improve the following deficits and impairments:  Pain,Impaired UE functional use,Impaired flexibility,Decreased strength,Decreased activity tolerance,Decreased range of motion,Increased edema,Difficulty walking  Visit Diagnosis: Pain in left wrist  Chronic pain of left knee     Problem List Patient Active Problem List   Diagnosis Date Noted  . Hypercalcemia 08/14/2020  . Positive ANA (antinuclear antibody) 08/14/2020  . Vitamin D deficiency 05/15/2020  . CRP elevated 05/15/2020  . Bilateral shoulder pain 03/22/2020  . Left wrist tendinitis 03/22/2020  . Urinary incontinence 02/21/2020  . Allergic rhinitis 02/21/2020  . 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 Chest pressure 02/14/2019  . 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 Obesity (BMI 30.0-34.9) 07/13/2018  . Osteopenia 02/18/2018  . Prediabetes 11/12/2016  . Anxiety 03/08/2010  . IBS 03/08/2010  . Hyperlipidemia 05/05/2008  . Depression 05/05/2008  . GERD 05/05/2008    Beaulah Dinning, PT, DPT 11/13/20 1:25 PM  [facility] [address] [place], [place], [address] Phone: [phone]   Fax:  [phone]  Name: Pam Butler MRN: [id number] Date of Birth: [birth year]

## 2020-11-15 ENCOUNTER — Other Ambulatory Visit: Payer: Self-pay

## 2020-11-15 ENCOUNTER — Ambulatory Visit: Payer: Medicare HMO

## 2020-11-15 DIAGNOSIS — M25532 Pain in left wrist: Secondary | ICD-10-CM

## 2020-11-15 DIAGNOSIS — G8929 Other chronic pain: Secondary | ICD-10-CM

## 2020-11-15 DIAGNOSIS — M25562 Pain in left knee: Secondary | ICD-10-CM

## 2020-11-15 NOTE — Therapy (Addendum)
[place], [place], [address] Phone: [phone]   Fax:  [phone]  Physical Therapy Treatment  Patient Details  Name: Pam Butler MRN: [id number] Date of Birth: 1946-04-28 Referring Provider (PT): Hulan Saas, DO   Encounter Date: 11/15/2020   PT End of Session - 11/15/20 1721    Visit Number 2    Number of Visits 12    Date for PT Re-Evaluation 12/25/20    Authorization Type [employer] MCR, progress note by visit 58, recheck FOTO by visit 6, no ionto    PT Start Time 1417    PT Stop Time 1500    PT Time Calculation (min) 43 min    Activity Tolerance Patient limited by pain    Behavior During Therapy [facility] for tasks assessed/performed           Past Medical History:  Diagnosis Date  . Allergy   . Anxiety   . Cataract    Bil  . Depression   . History of bloody stools    in past/  . History of muscle pain    Bil pain in bil arms and shoulders/ chronic since last year/left shoulder started this year  . Loose stools    on and off/occasional blood in stool    Past Surgical History:  Procedure Laterality Date  . ABDOMINAL HYSTERECTOMY     with bladder tack  . APPENDECTOMY    . BREAST BIOPSY Right 2001   benign  . CHOLECYSTECTOMY    . COLONOSCOPY    . TUBAL LIGATION      There were no vitals filed for this visit.                      [facility] Adult PT Treatment/Exercise - 11/15/20 0001      Exercises   Exercises Knee/Hip      Manual Therapy   Manual Therapy Joint mobilization;Soft tissue mobilization;Passive ROM    Joint Mobilization post/inf GHJ glides grade 3    Soft tissue mobilization STM to pecs and UT, quads    Passive ROM shoulder flexion/ER, wrist all planes, knee flex/ext                  PT Education - 11/15/20 1725    Education Details focus on HEP right now    Person(s) Educated Patient    Methods Explanation;Demonstration;Tactile cues;Verbal cues     Comprehension Verbalized understanding;Returned demonstration            PT Short Term Goals - 11/13/20 1319      PT SHORT TERM GOAL #1   Title Independent with initial HEP    Baseline HEP handout at eval    Time 3    Period Weeks    Status New      PT SHORT TERM GOAL #2   Title Increase left grip strength to 15 lbs. or greater to improve ability for gripping for chores at home    Baseline 7 lbs.    Time 3    Period Weeks    Status New             PT Long Term Goals - 11/13/20 1320      PT LONG TERM GOAL #1   Title Improve FOTO outcome measure score to 63% or greater function    Baseline 50%    Time 6    Period Weeks    Status New  Target Date 12/25/20      PT LONG TERM GOAL #2   Title Left wrist strength at least grossly 4+/5 and left hip strength at leats 4+/5 to 5/5 to improve ability for gripping and lifing activities for chores and to improve ability for stair navigation    Baseline left wrist grossly 4/5, see objective for hip MMTs    Time 6    Period Weeks    Status New    Target Date 12/25/20      PT LONG TERM GOAL #3   Title Navigate stairs as needed for home and community mobility with left knee pain decreased at least 50% or greater from baseline    Baseline no pain at rest at eval but intermittently higher with activity    Time 6    Period Weeks    Status New    Target Date 12/25/20      PT LONG TERM GOAL #4   Title Perform gripping for dressing, cooking and chores at home with left wrist pain 2/10 or less at worst    Baseline 2/10 pain at rest at eval but can increase to 8/10    Time 6    Period Weeks    Status New    Target Date 12/25/20                 Plan - 11/15/20 1710    Clinical Impression Statement Pt presents with continued wrist pain and swelling, limited L shoulder mobility,and L knee pain with decreased patellar mobility. Pt is loquacious and difficult to focus, but was able to perform lower body exercises with  proper form. Able to set goal for next session to just focus on HEP, as pt wanted to start old Pilates videos, as well as elliptical.    Personal Factors and Comorbidities Comorbidity 2;Time since onset of injury/illness/exacerbation   multiple tx. areas   Comorbidities see PMH    Examination-Activity Limitations Dressing;Bathing;Lift;Locomotion Level;Stairs;Stand    Examination-Participation Restrictions Meal Prep;Community Activity;Cleaning    Stability/Clinical Decision Making Evolving/Moderate complexity    Rehab Potential Good    PT Frequency 2x / week    PT Duration 6 weeks    PT Treatment/Interventions ADLs/Self Care Home Management;Cryotherapy;Ultrasound;Software engineer;Therapeutic exercise;Patient/family education;Manual techniques;Neuromuscular re-education;Functional mobility training;Therapeutic activities;Taping;Dry needling    PT Next Visit Plan [employer]-no ionto, review FOTO patient report by visit 3,for wrist try manual/STM to wrist extensor region, gentle nerve glides pending tolerance for median and radial nerve (limited shoulder mobility), review HEP and continue/progress eccentric and AROM as tolerated, modalities prn, for knee continue/progress hip and knee strengthening as tolerated pending pain, if needed trial patellar taping    PT Home Exercise Plan Access code: [email or web address]: left wrist extension eccentric (red Theraband), wrist extensor stretch, left median and radial nerve glides (see HEP for variations given with minimal shoulder ROM needed), for knee: Theraband hip abd, supine SLR, hip bridge    Consulted and Agree with Plan of Care Patient           Patient will benefit from skilled therapeutic intervention in order to improve the following deficits and impairments:  Pain,Impaired UE functional use,Impaired flexibility,Decreased strength,Decreased activity tolerance,Decreased range of motion,Increased edema,Difficulty walking  Visit  Diagnosis: Pain in left wrist  Chronic pain of left knee     Problem List Patient Active Problem List   Diagnosis Date Noted  . Hypercalcemia 08/14/2020  . Positive ANA (antinuclear antibody) 08/14/2020  . Vitamin D deficiency 05/15/2020  .  CRP elevated 05/15/2020  . Bilateral shoulder pain 03/22/2020  . Left wrist tendinitis 03/22/2020  . Urinary incontinence 02/21/2020  . Allergic rhinitis 02/21/2020  . 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 Chest pressure 02/14/2019  . 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 Obesity (BMI 30.0-34.9) 07/13/2018  . Osteopenia 02/18/2018  . Prediabetes 11/12/2016  . Anxiety 03/08/2010  . IBS 03/08/2010  . Hyperlipidemia 05/05/2008  . Depression 05/05/2008  . GERD 05/05/2008    Izell [place], PT, DPT 11/15/2020, 5:27 PM  [facility] [address] [place], [place], [address] Phone: [phone]   Fax:  [phone]  Name: Pam Butler MRN: [id number] Date of Birth: 1945-11-01

## 2020-11-22 ENCOUNTER — Other Ambulatory Visit: Payer: Self-pay

## 2020-11-22 ENCOUNTER — Ambulatory Visit: Payer: Medicare HMO

## 2020-11-22 DIAGNOSIS — G8929 Other chronic pain: Secondary | ICD-10-CM | POA: Diagnosis not present

## 2020-11-22 DIAGNOSIS — M25532 Pain in left wrist: Secondary | ICD-10-CM | POA: Diagnosis not present

## 2020-11-22 DIAGNOSIS — M25562 Pain in left knee: Secondary | ICD-10-CM | POA: Diagnosis not present

## 2020-11-22 NOTE — Therapy (Signed)
[place], [place], [address] Phone: [phone]   Fax:  [phone]  Physical Therapy Treatment  Patient Details  Name: Pam Butler MRN: [id number] Date of Birth: [birth year] Referring Provider (PT): Hulan Saas, DO   Encounter Date: 11/22/2020   PT End of Session - 11/22/20 1641    Visit Number 3    Number of Visits 12    Date for PT Re-Evaluation 12/25/20    Authorization Type [employer] MCR, progress note by visit 37, recheck FOTO by visit 6, no ionto    PT Start Time 1547    PT Stop Time 1633    PT Time Calculation (min) 46 min    Activity Tolerance Patient tolerated treatment well    Behavior During Therapy [facility] for tasks assessed/performed           Past Medical History:  Diagnosis Date  . Allergy   . Anxiety   . Cataract    Bil  . Depression   . History of bloody stools    in past/  . History of muscle pain    Bil pain in bil arms and shoulders/ chronic since last year/left shoulder started this year  . Loose stools    on and off/occasional blood in stool    Past Surgical History:  Procedure Laterality Date  . ABDOMINAL HYSTERECTOMY     with bladder tack  . APPENDECTOMY    . BREAST BIOPSY Right 2001   benign  . CHOLECYSTECTOMY    . COLONOSCOPY    . TUBAL LIGATION      There were no vitals filed for this visit.   Subjective Assessment - 11/22/20 1552    Subjective Pt reports she is doing her HEP, thinks the hand be a litle less swollen. She is having an issue ascending stairs that seems to be worse recently, having to step to to negotiate but not having to use a HR but uses wall for balance. Pt reports her L shoulder felt better after it was worked on last session. Additionally, she has been trying to relax more.    Pertinent History chronic shoulder pain, history of anxiety and depression    Limitations Standing;Lifting;House hold activities;Walking    Diagnostic tests [place], X-rays     Patient Stated Goals Get wrist and knee better    Currently in Pain? Yes    Pain Score 5     Pain Location Wrist    Pain Orientation Left    Pain Descriptors / Indicators Sharp    Pain Type Chronic pain    Pain Onset More than a month ago    Multiple Pain Sites Yes    Pain Score 3   up to 7 with stairs   Pain Location Knee    Pain Orientation Posterior;Right;Left    Pain Descriptors / Indicators Heaviness    Pain Onset More than a month ago    Pain Frequency Intermittent                             [facility] Adult PT Treatment/Exercise - 11/22/20 0001      Knee/Hip Exercises: Stretches   Passive Hamstring Stretch Both;2 reps;30 seconds    Passive Hamstring Stretch Limitations c straps      Knee/Hip Exercises: Standing   Forward Step Up Both;1 set;15 reps;Hand Hold: 1;Step Height: 6"    Forward Step Up Limitations opp LE hip flexion  to simulate stairs, hand hold to FM bar      Manual Therapy   Manual Therapy Joint mobilization;Soft tissue mobilization;Passive ROM    Joint Mobilization post/inf GHJ glides grade 3    Soft tissue mobilization STM/XFM to subscap    Passive ROM Shoulder flexion stretching, gentle ER, wrist flex/ext with elbow extension, stabilize scapular and added traction to shoulder with some flexion to stretch LATS                  PT Education - 11/22/20 1644    Education Details stairs at home focusing on slight forward lean and pushing evenly through heel to encourage posterior chain engagement    Person(s) Educated Patient    Methods Explanation;Demonstration;Tactile cues;Verbal cues    Comprehension Verbalized understanding;Returned demonstration;Verbal cues required            PT Short Term Goals - 11/13/20 1319      PT SHORT TERM GOAL #1   Title Independent with initial HEP    Baseline HEP handout at eval    Time 3    Period Weeks    Status New      PT SHORT TERM GOAL #2   Title Increase left grip strength to 15  lbs. or greater to improve ability for gripping for chores at home    Baseline 7 lbs.    Time 3    Period Weeks    Status New             PT Long Term Goals - 11/13/20 1320      PT LONG TERM GOAL #1   Title Improve FOTO outcome measure score to 63% or greater function    Baseline 50%    Time 6    Period Weeks    Status New    Target Date 12/25/20      PT LONG TERM GOAL #2   Title Left wrist strength at least grossly 4+/5 and left hip strength at leats 4+/5 to 5/5 to improve ability for gripping and lifing activities for chores and to improve ability for stair navigation    Baseline left wrist grossly 4/5, see objective for hip MMTs    Time 6    Period Weeks    Status New    Target Date 12/25/20      PT LONG TERM GOAL #3   Title Navigate stairs as needed for home and community mobility with left knee pain decreased at least 50% or greater from baseline    Baseline no pain at rest at eval but intermittently higher with activity    Time 6    Period Weeks    Status New    Target Date 12/25/20      PT LONG TERM GOAL #4   Title Perform gripping for dressing, cooking and chores at home with left wrist pain 2/10 or less at worst    Baseline 2/10 pain at rest at eval but can increase to 8/10    Time 6    Period Weeks    Status New    Target Date 12/25/20                 Plan - 11/22/20 1550    Clinical Impression Statement Pt responded well to STM to wrist flexors/extensors, reporting less pain following with only ulnar sided pain remaining. Additionally, pt reports improvement in L shoulder after manual therapy last week and this week with strong desire to reach B UE  overhead again. Focused session on L UE ROM and B LE stretch/strength with good tolerance. Educated pt on stairs mechanics via step-ups, focusing on posterior chain as well to reduce pressure on knees.    Personal Factors and Comorbidities Comorbidity 2;Time since onset of injury/illness/exacerbation    multiple tx. areas   Comorbidities see PMH    Examination-Activity Limitations Dressing;Bathing;Lift;Locomotion Level;Stairs;Stand    Examination-Participation Restrictions Meal Prep;Community Activity;Cleaning    Stability/Clinical Decision Making Evolving/Moderate complexity    Rehab Potential Good    PT Frequency 2x / week    PT Duration 6 weeks    PT Treatment/Interventions ADLs/Self Care Home Management;Cryotherapy;Ultrasound;Software engineer;Therapeutic exercise;Patient/family education;Manual techniques;Neuromuscular re-education;Functional mobility training;Therapeutic activities;Taping;Dry needling    PT Next Visit Plan [employer]-no ionto, for wrist manual/STM to wrist extensor region, gentle nerve glides pending tolerance for median and radial nerve (limited shoulder mobility), review HEP and continue/progress eccentric and AROM as tolerated, modalities prn, for knee continue/progress hip and knee strengthening as tolerated pending pain, if needed trial patellar taping    PT Home Exercise Plan Access code: [email or web address]: left wrist extension eccentric (red Theraband), wrist extensor stretch, left median and radial nerve glides (see HEP for variations given with minimal shoulder ROM needed), for knee: Theraband hip abd, supine SLR, hip bridge    Consulted and Agree with Plan of Care Patient           Patient will benefit from skilled therapeutic intervention in order to improve the following deficits and impairments:  Pain,Impaired UE functional use,Impaired flexibility,Decreased strength,Decreased activity tolerance,Decreased range of motion,Increased edema,Difficulty walking  Visit Diagnosis: Pain in left wrist  Chronic pain of left knee     Problem List Patient Active Problem List   Diagnosis Date Noted  . Hypercalcemia 08/14/2020  . Positive ANA (antinuclear antibody) 08/14/2020  . Vitamin D deficiency 05/15/2020  . CRP elevated 05/15/2020  .  Bilateral shoulder pain 03/22/2020  . Left wrist tendinitis 03/22/2020  . Urinary incontinence 02/21/2020  . Allergic rhinitis 02/21/2020  . 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 Chest pressure 02/14/2019  . 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 Obesity (BMI 30.0-34.9) 07/13/2018  . Osteopenia 02/18/2018  . Prediabetes 11/12/2016  . Anxiety 03/08/2010  . IBS 03/08/2010  . Hyperlipidemia 05/05/2008  . Depression 05/05/2008  . GERD 05/05/2008    Izell Dupont, PT, DPT 11/22/2020, 4:46 PM  [facility] [address] [place], [place], [address] Phone: [phone]   Fax:  [phone]  Name: KRISLYNN GRONAU MRN: [id number] Date of Birth: May 23, 1946

## 2020-11-27 NOTE — Progress Notes (Signed)
[facility] [address] [place] Phone: [phone] Subjective:   I Pam Butler am serving as a Education administrator for Dr. Hulan Saas.  This visit occurred during the SARS-CoV-2 public health emergency.  Safety protocols were in place, including screening questions prior to the visit, additional usage of staff PPE, and extensive cleaning of exam room while observing appropriate contact time as indicated for disinfecting solutions.   I'm seeing this patient by the request  of:  [name], MD  CC: shoulder and wrist pain follow up   LEX:[email or web address]   10/10/2020 Continued swelling noted of the tendon at this time.  We discussed the hypoechoic changes and increasing in Doppler flow.  Patient still does not want an injection and has been noncompliant on the home exercises.  Patient did have a lot of personal things going on.  Encouraged her to start with physical therapy again.  Patient will increase activity slowly. She declined MRI as well   Follow-up with me again 7 to 8 weeks.  Patient has had this previously positive.  We have discussed the referral to rheumatology again.  Patient wants to continue with what she is doing at this time.  I do feel that further work-up may be necessary and has had signs and symptoms consistent with an polymyalgia rheumatica.  Total time with patient today 33 minutes  Update 11/28/2020 Pam Butler is a 75 y.o. female coming in with complaint of left wrist pain. Patient states she is in pain today. Just came from PT. States it is a little sore today and potentially still swollen. Believes she is the same. Has been going to PT for about 2-3 weeks. Patient did have to reschedule secondary to snow and taking care of her mother.  Patient does feel like it is improved but still swollen.    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 Allergy   . Anxiety    . Cataract    Bil  . Depression   . History of bloody stools    in past/  . History of muscle pain    Bil pain in bil arms and shoulders/ chronic since last year/left shoulder started this year  . Loose stools    on and off/occasional blood in stool   Past Surgical History:  Procedure Laterality Date  . ABDOMINAL HYSTERECTOMY     with bladder tack  . APPENDECTOMY    . BREAST BIOPSY Right 2001   benign  . CHOLECYSTECTOMY    . COLONOSCOPY    . TUBAL LIGATION     Social History   Socioeconomic History  . Marital status: Married    Spouse name: Not on file  . Number of children: 2  . Years of education: Not on file  . Highest education level: Not on file  Occupational History  . Occupation: retired  Tobacco Use  . Smoking status: Never Smoker  . Smokeless tobacco: Never Used  Vaping Use  . Vaping Use: Never used  Substance and Sexual Activity  . Alcohol use: No    Alcohol/week: 0.0 standard drinks  . Drug use: No  . Sexual activity: Never  Other Topics Concern  . Not on file  Social History Narrative   Exercise: none   Social Determinants of Health   Financial Resource Strain: Not on file  Food Insecurity: Not on file  Transportation Needs: Not on file  Physical Activity: Not on file  Stress: Not on file  Social Connections: Not on file   Allergies  Allergen Reactions  . Latex     Causes swelling around face  . Penicillins     Hives  . Ciprofloxacin     Itching and hives  . Codeine     Itching(note: She has taken codeine containing cough preparations since)   Family History  Problem Relation Age of Onset  . Other Mother        Prediabetes  . Colitis Mother   . Pneumonia Father   . Arthritis Sister   . Heart attack Paternal Grandmother 46  . Heart attack Maternal Uncle 67  . Colon cancer Neg Hx     Current Outpatient Medications (Endocrine & Metabolic):  .  predniSONE (DELTASONE) 20 MG tablet, Take 1 tablet (20 mg total) by mouth 2 (two)  times daily.  Current Outpatient Medications (Cardiovascular):  .  ezetimibe (ZETIA) 10 MG tablet, TAKE 1 TABLET BY MOUTH EVERY DAY .  rosuvastatin (CRESTOR) 5 MG tablet, TAKE 1 TABLET (5 MG TOTAL) BY MOUTH 3 (THREE) TIMES A WEEK.  Current Outpatient Medications (Respiratory):  .  cetirizine (ZYRTEC) 10 MG tablet, Take 1 tablet (10 mg total) by mouth daily.  Current Outpatient Medications (Analgesics):  Marland Kitchen  Acetaminophen (TYLENOL EXTRA STRENGTH PO), Take by mouth. Take one prn .  aspirin EC 325 MG tablet, Take 325 mg by mouth as needed.   Current Outpatient Medications (Other):  .  calcium carbonate (CALCIUM 600) 600 MG TABS tablet, Take 1 tablet (600 mg total) by mouth daily with breakfast. .  calcium carbonate (TUMS - DOSED IN MG ELEMENTAL CALCIUM) 500 MG chewable tablet, Chew 1 tablet by mouth as needed for indigestion or heartburn. .  chlorhexidine (PERIDEX) 0.12 % solution,  .  Cholecalciferol (VITAMIN D) 50 MCG (2000 UT) CAPS, 1 cap daily .  cyclobenzaprine (FLEXERIL) 5 MG tablet, Take 1 tablet (5 mg total) by mouth at bedtime as needed for muscle spasms. .  Melatonin 10 MG CAPS, Take by mouth as needed. .  Multiple Vitamins-Minerals (WOMENS MULTI VITAMIN & MINERAL PO), Take by mouth. Centrum Womens MVI-Take one daily .  sertraline (ZOLOFT) 50 MG tablet, TAKE 1 TABLET BY MOUTH EVERYDAY AT BEDTIME   Reviewed prior external information including notes and imaging from  primary care provider As well as notes that were available from care everywhere and other healthcare systems.  Past medical history, social, surgical and family history all reviewed in electronic medical record.  No pertanent information unless stated regarding to the chief complaint.   Review of Systems:  No headache, visual changes, nausea, vomiting, diarrhea, constipation, dizziness, abdominal pain, skin rash, fevers, chills, night sweats, weight loss, swollen lymph nodes, body aches, joint swelling, chest pain,  shortness of breath, mood changes. POSITIVE muscle aches  Objective  Blood pressure 120/70, pulse 63, height 5\' 2"  (1.575 m), weight 171 lb (77.6 kg), SpO2 99 %.   General: No apparent distress alert and oriented x3 mood and affect normal, dressed appropriately.  HEENT: Pupils equal, extraocular movements intact  Respiratory: Patient's speak in full sentences and does not appear short of breath  Cardiovascular: No lower extremity edema, non tender, no erythema  Gait normal with good balance and coordination.  MSK: Left wrist still has some arthritic changes compared to contralateral side.  Some mild improvement though in the inflammation noted on the dorsal aspect of the wrist.  Patient still has some mild difficulty with her grip strength. Negative Tinel's of the carpal tunnel.  Patient does have pain with resisted extension of the wrist.  Does have some limitations in extension of 10 degrees of the wrist in the last 5 degrees of flexion.  Neurovascularly intact distally.   Impression and Recommendations:     The above documentation has been reviewed and is accurate and complete Lyndal Pulley, DO

## 2020-11-28 ENCOUNTER — Encounter: Payer: Self-pay | Admitting: Family Medicine

## 2020-11-28 ENCOUNTER — Ambulatory Visit: Payer: Medicare HMO | Admitting: Physical Therapy

## 2020-11-28 ENCOUNTER — Other Ambulatory Visit: Payer: Self-pay

## 2020-11-28 ENCOUNTER — Ambulatory Visit: Payer: Medicare HMO | Admitting: Family Medicine

## 2020-11-28 ENCOUNTER — Encounter: Payer: Self-pay | Admitting: Physical Therapy

## 2020-11-28 DIAGNOSIS — M778 Other enthesopathies, not elsewhere classified: Secondary | ICD-10-CM | POA: Diagnosis not present

## 2020-11-28 DIAGNOSIS — G8929 Other chronic pain: Secondary | ICD-10-CM | POA: Diagnosis not present

## 2020-11-28 DIAGNOSIS — M25562 Pain in left knee: Secondary | ICD-10-CM

## 2020-11-28 DIAGNOSIS — M25532 Pain in left wrist: Secondary | ICD-10-CM

## 2020-11-28 NOTE — Assessment & Plan Note (Addendum)
Patient continues in pain.  Has been to physical therapy a couple times.  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  Discussed with patient that today I do feel that patient has had some decrease in inflammation.  Very mild improvement in range of motion.  I do feel because of the longevity of this giving [place] trouble I do think advanced imaging would be warranted but patient declined.  Patient would not want any surgical intervention with her taking care of her mother still.  Patient wants to continue with physical therapy at this time.  Declined any type of injection for pain relief.  Encouraged her to continue to increase activity as tolerable.  Follow-up with me again in 6 weeks and at that time if continuing to have pain we will consider MRI we also discussed the possibility of a nerve conduction study which patient declined.  Total time including review and with patient today 32 minutes

## 2020-11-28 NOTE — Patient Instructions (Signed)
Good to see you Mild progress Continue PT Not where we need to be I need a MRI See me again in 5-6 weeks

## 2020-11-28 NOTE — Therapy (Signed)
[place], [place], [address] Phone: [phone]   Fax:  [phone]  Physical Therapy Treatment  Patient Details  Name: Pam Butler MRN: [id number] Date of Birth: 1946-07-30 Referring Provider (PT): Hulan Saas, DO   Encounter Date: 11/28/2020   PT End of Session - 11/28/20 1201    Visit Number 4    Number of Visits 12    Date for PT Re-Evaluation 12/25/20    Authorization Type [employer] MCR, progress note by visit 24, recheck FOTO by visit 6, no ionto    PT Start Time 1159    PT Stop Time 1240    PT Time Calculation (min) 41 min           Past Medical History:  Diagnosis Date  . Allergy   . Anxiety   . Cataract    Bil  . Depression   . History of bloody stools    in past/  . History of muscle pain    Bil pain in bil arms and shoulders/ chronic since last year/left shoulder started this year  . Loose stools    on and off/occasional blood in stool    Past Surgical History:  Procedure Laterality Date  . ABDOMINAL HYSTERECTOMY     with bladder tack  . APPENDECTOMY    . BREAST BIOPSY Right 2001   benign  . CHOLECYSTECTOMY    . COLONOSCOPY    . TUBAL LIGATION      There were no vitals filed for this visit.   Subjective Assessment - 11/28/20 1155    Subjective Pt reports that everything is bothering her    Patient Stated Goals Get wrist and knee better    Currently in Pain? No/denies   no pain at rest   Pain Location Wrist    Pain Orientation Left    Multiple Pain Sites --   no pain in knee at rest only with walking                            [facility] Adult PT Treatment/Exercise - 11/28/20 0001      Exercises   Exercises Knee/Hip;Shoulder      Knee/Hip Exercises: Stretches   Active Hamstring Stretch Both    Active Hamstring Stretch Limitations 15 reps dynamic stretching    Other Knee/Hip Stretches hooklying feet wide knees in/out      Knee/Hip Exercises: Aerobic    Nustep nustep L4x5' U/LE      Knee/Hip Exercises: Seated   Sit to Sand 10 reps;without UE support      Knee/Hip Exercises: Supine   Bridges Strengthening;15 reps    Straight Leg Raise with External Rotation Strengthening;Both;15 reps      Shoulder Exercises: Supine   External Rotation Strengthening;Both;15 reps;Theraband    Theraband Level (Shoulder External Rotation) Level 3 (Green)    External Rotation Limitations with isometric hold      Shoulder Exercises: Sidelying   Other Sidelying Exercises 5reps open book with Lt UE, VC for form and encouragement      Manual Therapy   Joint Mobilization Lt shoulder into ER in supine , with contract relax into ER Lt side, Lt wrist carpal mobs and distal radial /ulnar    Soft tissue mobilization STM to Lt forearm and hand    Passive ROM Lt shoulder in all directions with slight distraction  PT Short Term Goals - 11/13/20 1319      PT SHORT TERM GOAL #1   Title Independent with initial HEP    Baseline HEP handout at eval    Time 3    Period Weeks    Status New      PT SHORT TERM GOAL #2   Title Increase left grip strength to 15 lbs. or greater to improve ability for gripping for chores at home    Baseline 7 lbs.    Time 3    Period Weeks    Status New             PT Long Term Goals - 11/13/20 1320      PT LONG TERM GOAL #1   Title Improve FOTO outcome measure score to 63% or greater function    Baseline 50%    Time 6    Period Weeks    Status New    Target Date 12/25/20      PT LONG TERM GOAL #2   Title Left wrist strength at least grossly 4+/5 and left hip strength at leats 4+/5 to 5/5 to improve ability for gripping and lifing activities for chores and to improve ability for stair navigation    Baseline left wrist grossly 4/5, see objective for hip MMTs    Time 6    Period Weeks    Status New    Target Date 12/25/20      PT LONG TERM GOAL #3   Title Navigate stairs as needed for  home and community mobility with left knee pain decreased at least 50% or greater from baseline    Baseline no pain at rest at eval but intermittently higher with activity    Time 6    Period Weeks    Status New    Target Date 12/25/20      PT LONG TERM GOAL #4   Title Perform gripping for dressing, cooking and chores at home with left wrist pain 2/10 or less at worst    Baseline 2/10 pain at rest at eval but can increase to 8/10    Time 6    Period Weeks    Status New    Target Date 12/25/20                 Plan - 11/28/20 1243    Clinical Impression Statement Pam Butler continues to report generalized pain in her body.  Seh was encouraged to keep moving and exercising to stay loose.  She has limited Lt shoulder and wrist motion, she is also resistent to PROM, she tries to relax however has difficulty with this.  Pt appears to have some atrophy in the Lt hand.  She was able to perform LE ther ex without difficulty.  Would benefit from more hip flexibility    Rehab Potential Good    PT Frequency 2x / week    PT Duration 6 weeks    PT Treatment/Interventions ADLs/Self Care Home Management;Cryotherapy;Ultrasound;Software engineer;Therapeutic exercise;Patient/family education;Manual techniques;Neuromuscular re-education;Functional mobility training;Therapeutic activities;Taping;Dry needling    PT Next Visit Plan [employer]-no ionto, she sees MD this after noon, will see what he advises.    PT Home Exercise Plan Access code: [email or web address]: left wrist extension eccentric (red Theraband), wrist extensor stretch, left median and radial nerve glides (see HEP for variations given with minimal shoulder ROM needed), for knee: Theraband hip abd, supine SLR, hip bridge    Consulted and Agree with Plan of Care  Patient           Patient will benefit from skilled therapeutic intervention in order to improve the following deficits and impairments:  Pain,Impaired UE functional  use,Impaired flexibility,Decreased strength,Decreased activity tolerance,Decreased range of motion,Increased edema,Difficulty walking  Visit Diagnosis: Pain in left wrist  Chronic pain of left knee     Problem List Patient Active Problem List   Diagnosis Date Noted  . Hypercalcemia 08/14/2020  . Positive ANA (antinuclear antibody) 08/14/2020  . Vitamin D deficiency 05/15/2020  . CRP elevated 05/15/2020  . Bilateral shoulder pain 03/22/2020  . Left wrist tendinitis 03/22/2020  . Urinary incontinence 02/21/2020  . Allergic rhinitis 02/21/2020  . 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 Chest pressure 02/14/2019  . 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 Obesity (BMI 30.0-34.9) 07/13/2018  . Osteopenia 02/18/2018  . Prediabetes 11/12/2016  . Anxiety 03/08/2010  . IBS 03/08/2010  . Hyperlipidemia 05/05/2008  . Depression 05/05/2008  . GERD 05/05/2008    Manuela Schwartz Zephyr Ridley PT  11/28/2020, 12:47 PM  [facility] [address] [place], [place], [address] Phone: [phone]   Fax:  [phone]  Name: Pam Butler MRN: [id number] Date of Birth: December 04, 1945

## 2020-11-30 ENCOUNTER — Ambulatory Visit: Payer: Medicare HMO

## 2020-11-30 ENCOUNTER — Other Ambulatory Visit: Payer: Self-pay

## 2020-11-30 DIAGNOSIS — G8929 Other chronic pain: Secondary | ICD-10-CM | POA: Diagnosis not present

## 2020-11-30 DIAGNOSIS — M25532 Pain in left wrist: Secondary | ICD-10-CM | POA: Diagnosis not present

## 2020-11-30 DIAGNOSIS — M25562 Pain in left knee: Secondary | ICD-10-CM | POA: Diagnosis not present

## 2020-11-30 NOTE — Patient Instructions (Signed)
Finger and wrist flexion stretch DeQuervain's stretch

## 2020-11-30 NOTE — Therapy (Signed)
[place], [place], [address] Phone: [phone]   Fax:  [phone]  Physical Therapy Treatment  Patient Details  Name: Pam Butler MRN: [id number] Date of Birth: 1945/10/20 Referring Provider (PT): Hulan Saas, DO   Encounter Date: 11/30/2020   PT End of Session - 11/30/20 1542    Visit Number 5    Number of Visits 12    Date for PT Re-Evaluation 12/25/20    Authorization Type [employer] MCR, progress note by visit 65, recheck FOTO by visit 6, no ionto    PT Start Time 1351    PT Stop Time 1440    PT Time Calculation (min) 49 min    Activity Tolerance Patient tolerated treatment well    Behavior During Therapy [facility] for tasks assessed/performed           Past Medical History:  Diagnosis Date  . Allergy   . Anxiety   . Cataract    Bil  . Depression   . History of bloody stools    in past/  . History of muscle pain    Bil pain in bil arms and shoulders/ chronic since last year/left shoulder started this year  . Loose stools    on and off/occasional blood in stool    Past Surgical History:  Procedure Laterality Date  . ABDOMINAL HYSTERECTOMY     with bladder tack  . APPENDECTOMY    . BREAST BIOPSY Right 2001   benign  . CHOLECYSTECTOMY    . COLONOSCOPY    . TUBAL LIGATION      There were no vitals filed for this visit.   Subjective Assessment - 11/30/20 1513    Subjective Pt reports overall, her L arm and hand symptoms have been bothering her the most. MD states the swelling is a little better.    Pertinent History chronic shoulder pain, history of anxiety and depression    Limitations Standing;Lifting;House hold activities;Walking    Patient Stated Goals Get wrist and knee better    Currently in Pain? Yes    Pain Score 2    high level of pain is 6/10 for L arm, wrist and hand   Pain Location Wrist    Pain Orientation Left    Pain Descriptors / Indicators Sharp    Pain Type Chronic  pain    Pain Onset More than a month ago    Pain Frequency Constant    Aggravating Factors  gripping, wrist use    Pain Relieving Factors rest    Effect of Pain on Daily Activities limits ability gripping activiites such as opening jars                             [facility] Adult PT Treatment/Exercise - 11/30/20 0001      Exercises   Exercises Wrist      Wrist Exercises   Other wrist exercises wrist and finger ext stretches 3x, 30 sec; wrist and finger ext 3x, 30 sec    Other wrist exercises De Quervain's thumb stretch 3x, 30 sec      Manual Therapy   Manual Therapy Soft tissue mobilization;Passive ROM    Soft tissue mobilization STM to the wrist extensors and thumb extensors and abductor. Pt xperienced intermittent tenderness.    Passive ROM Pasive stretching of the wrist extensors, wrist flexors and thumb extensors and abductor. Stretches were applied as tolerated.  PT Education - 11/30/20 1540    Education Details HEP for wrist flexion and thumb stretch. Arm suport with pillows for supine sleeping    Person(s) Educated Patient    Methods Explanation;Demonstration;Tactile cues;Verbal cues;Handout    Comprehension Verbalized understanding;Returned demonstration;Verbal cues required;Tactile cues required            PT Short Term Goals - 11/13/20 1319      PT SHORT TERM GOAL #1   Title Independent with initial HEP    Baseline HEP handout at eval    Time 3    Period Weeks    Status New      PT SHORT TERM GOAL #2   Title Increase left grip strength to 15 lbs. or greater to improve ability for gripping for chores at home    Baseline 7 lbs.    Time 3    Period Weeks    Status New             PT Long Term Goals - 11/13/20 1320      PT LONG TERM GOAL #1   Title Improve FOTO outcome measure score to 63% or greater function    Baseline 50%    Time 6    Period Weeks    Status New    Target Date 12/25/20      PT LONG TERM  GOAL #2   Title Left wrist strength at least grossly 4+/5 and left hip strength at leats 4+/5 to 5/5 to improve ability for gripping and lifing activities for chores and to improve ability for stair navigation    Baseline left wrist grossly 4/5, see objective for hip MMTs    Time 6    Period Weeks    Status New    Target Date 12/25/20      PT LONG TERM GOAL #3   Title Navigate stairs as needed for home and community mobility with left knee pain decreased at least 50% or greater from baseline    Baseline no pain at rest at eval but intermittently higher with activity    Time 6    Period Weeks    Status New    Target Date 12/25/20      PT LONG TERM GOAL #4   Title Perform gripping for dressing, cooking and chores at home with left wrist pain 2/10 or less at worst    Baseline 2/10 pain at rest at eval but can increase to 8/10    Time 6    Period Weeks    Status New    Target Date 12/25/20                 Plan - 11/30/20 1545    Clinical Impression Statement PT was completed for pt's L arm pain. STM was provided to the wrist extensors and thumb extensors and abductor. Pt then completed stretching for these muscles as well as the wrist flexors. Pt was enourage to complete the stretching exs 2x daily and STM to the areas as addressed by the PT today. Pt tolerated the PT session without adverse effects.    Personal Factors and Comorbidities Comorbidity 2;Time since onset of injury/illness/exacerbation    Comorbidities see PMH    Examination-Activity Limitations Dressing;Bathing;Lift;Locomotion Level;Stairs;Stand    Examination-Participation Restrictions Meal Prep;Community Activity;Cleaning    Stability/Clinical Decision Making Evolving/Moderate complexity    Clinical Decision Making Moderate    Rehab Potential Good    PT Frequency 2x / week    PT Duration  6 weeks    PT Treatment/Interventions ADLs/Self Care Home Management;Cryotherapy;Ultrasound;Set designer;Therapeutic exercise;Patient/family education;Manual techniques;Neuromuscular re-education;Functional mobility training;Therapeutic activities;Taping;Dry needling    PT Next Visit Plan [employer]-no ionto.    PT Home Exercise Plan Access code: [email or web address]: left wrist extension eccentric (red Theraband), wrist extensor stretch, left median and radial nerve glides (see HEP for variations given with minimal shoulder ROM needed), for knee: Theraband hip abd, supine SLR, hip bridge           Patient will benefit from skilled therapeutic intervention in order to improve the following deficits and impairments:  Pain,Impaired UE functional use,Impaired flexibility,Decreased strength,Decreased activity tolerance,Decreased range of motion,Increased edema,Difficulty walking  Visit Diagnosis: Pain in left wrist  Chronic pain of left knee     Problem List Patient Active Problem List   Diagnosis Date Noted  . Hypercalcemia 08/14/2020  . Positive ANA (antinuclear antibody) 08/14/2020  . Vitamin D deficiency 05/15/2020  . CRP elevated 05/15/2020  . Bilateral shoulder pain 03/22/2020  . Left wrist tendinitis 03/22/2020  . Urinary incontinence 02/21/2020  . Allergic rhinitis 02/21/2020  . 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 Chest pressure 02/14/2019  . 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 Obesity (BMI 30.0-34.9) 07/13/2018  . Osteopenia 02/18/2018  . Prediabetes 11/12/2016  . Anxiety 03/08/2010  . IBS 03/08/2010  . Hyperlipidemia 05/05/2008  . Depression 05/05/2008  . GERD 05/05/2008    Gar Ponto MS, PT 11/30/20 4:08 PM  [facility] [facility] [address] [place], [place], [address] Phone: [phone]   Fax:  [phone]  Name: Pam Butler MRN: [id number] Date of Birth: [birth year]

## 2020-12-04 ENCOUNTER — Other Ambulatory Visit: Payer: Self-pay

## 2020-12-04 ENCOUNTER — Ambulatory Visit: Payer: Medicare HMO | Admitting: Physical Therapy

## 2020-12-04 ENCOUNTER — Encounter: Payer: Self-pay | Admitting: Physical Therapy

## 2020-12-04 DIAGNOSIS — M25562 Pain in left knee: Secondary | ICD-10-CM

## 2020-12-04 DIAGNOSIS — G8929 Other chronic pain: Secondary | ICD-10-CM

## 2020-12-04 DIAGNOSIS — M25532 Pain in left wrist: Secondary | ICD-10-CM | POA: Diagnosis not present

## 2020-12-04 NOTE — Therapy (Signed)
[place], [place], [address] Phone: [phone]   Fax:  [phone]  Physical Therapy Treatment  Patient Details  Name: Pam Butler MRN: [id number] Date of Birth: [birth year] Referring Provider (PT): Hulan Saas, DO   Encounter Date: 12/04/2020   PT End of Session - 12/04/20 1512    Visit Number 6    Number of Visits 12    Date for PT Re-Evaluation 12/25/20    Authorization Type [employer] MCR, progress note by visit 10, recheck FOTO again but visit 10, no ionto    PT Start Time 1415    PT Stop Time 1501    PT Time Calculation (min) 46 min    Activity Tolerance Patient tolerated treatment well    Behavior During Therapy [facility] for tasks assessed/performed           Past Medical History:  Diagnosis Date  . Allergy   . Anxiety   . Cataract    Bil  . Depression   . History of bloody stools    in past/  . History of muscle pain    Bil pain in bil arms and shoulders/ chronic since last year/left shoulder started this year  . Loose stools    on and off/occasional blood in stool    Past Surgical History:  Procedure Laterality Date  . ABDOMINAL HYSTERECTOMY     with bladder tack  . APPENDECTOMY    . BREAST BIOPSY Right 2001   benign  . CHOLECYSTECTOMY    . COLONOSCOPY    . TUBAL LIGATION      There were no vitals filed for this visit.   Subjective Assessment - 12/04/20 1509    Subjective Pt. reports mild improvement in left wrist but still having pain worse in AM. For knees noting tightness in hamstring region bilaterally as well as tightness in right lateral thigh.    Pertinent History chronic shoulder pain, history of anxiety and depression    Diagnostic tests [place], X-rays    Patient Stated Goals Get wrist and knee better              [facility] PT Assessment - 12/04/20 0001      Observation/Other Assessments   Focus on Therapeutic Outcomes (FOTO)  48% function (wrist), 63% function (knee)                          [facility] Adult PT Treatment/Exercise - 12/04/20 0001      Knee/Hip Exercises: Stretches   Passive Hamstring Stretch Right;Left;3 reps;30 seconds    ITB Stretch Right;3 reps;30 seconds    ITB Stretch Limitations supine-pt. had noted lateral though region tightness on right so added stretch      Knee/Hip Exercises: Seated   Sit to Sand 10 reps;without UE support   from incline wedge on low mat     Knee/Hip Exercises: Supine   Bridges AROM;Strengthening;Both;2 sets;10 reps    Bridges Limitations legs n reversed incline wedge    Other Supine Knee/Hip Exercises clamshell green band 2x10      Wrist Exercises   Other wrist exercises eccentric wrist flexion and extension with therapist assist concentric motion 2 lbs. 2x10 ea. left side      Manual Therapy   Soft tissue mobilization STM/IASTM left wrist flexors and extensors                  PT Education - 12/04/20 1511  Education Details exercises, FOTO status    Person(s) Educated Patient    Methods Explanation;Demonstration;Verbal cues;Tactile cues    Comprehension Verbalized understanding;Returned demonstration;Verbal cues required;Tactile cues required            PT Short Term Goals - 11/13/20 1319      PT SHORT TERM GOAL #1   Title Independent with initial HEP    Baseline HEP handout at eval    Time 3    Period Weeks    Status New      PT SHORT TERM GOAL #2   Title Increase left grip strength to 15 lbs. or greater to improve ability for gripping for chores at home    Baseline 7 lbs.    Time 3    Period Weeks    Status New             PT Long Term Goals - 12/04/20 1518      PT LONG TERM GOAL #1   Title Improve FOTO outcome measure score to 63% or greater function    Baseline 50%    Time 6    Period Weeks    Status On-going      PT LONG TERM GOAL #2   Title Left wrist strength at least grossly 4+/5 and left hip strength at leats 4+/5 to 5/5 to improve ability for  gripping and lifing activities for chores and to improve ability for stair navigation    Baseline left wrist grossly 4/5, see objective for hip MMTs    Time 6    Period Weeks    Status On-going      PT LONG TERM GOAL #3   Title Navigate stairs as needed for home and community mobility with left knee pain decreased at least 50% or greater from baseline    Baseline no pain at rest at eval but intermittently higher with activity    Time 6    Period Weeks    Status On-going      PT LONG TERM GOAL #4   Title Perform gripping for dressing, cooking and chores at home with left wrist pain 2/10 or less at worst    Baseline 2/10 pain at rest at eval but can increase to 8/10    Time 6    Period Weeks    Status On-going                 Plan - 12/04/20 1512    Clinical Impression Statement For wrist able to progress light eccentrics today for tendinopathy with 2 lb. weight as noted per flowsheet-some initial difficulty with form with tendency muscle guarding but well-tolerated with verbal/tactile cues for form. Limited time for knee tx. but focus stretches and hip/knee strengthening-pain with sit<>stand in anterior knee thus limited to 1 set otherwise knee/hip exercises well-tolerated as well. Given symptom etiology for both regions expect gradual progress. Mild setback in FOTO score for wrist (2% from baseline) but pt. reports may have not answered questions at eval with accurate reflection of more severe pain she was having and does note mild subjective improvement in symptoms from baseline.    Personal Factors and Comorbidities Comorbidity 2;Time since onset of injury/illness/exacerbation    Comorbidities see PMH    Examination-Activity Limitations Dressing;Bathing;Lift;Locomotion Level;Stairs;Stand    Examination-Participation Restrictions Meal Prep;Community Activity;Cleaning    Stability/Clinical Decision Making Evolving/Moderate complexity    Clinical Decision Making Moderate    Rehab  Potential Good    PT Frequency 2x / week  PT Duration 6 weeks    PT Treatment/Interventions ADLs/Self Care Home Management;Cryotherapy;Ultrasound;Software engineer;Therapeutic exercise;Patient/family education;Manual techniques;Neuromuscular re-education;Functional mobility training;Therapeutic activities;Taping;Dry needling    PT Next Visit Plan [employer]-no ionto, continue progression wrist strengthening/eccentrics as tolerated, continue manual to wrist region,  hip/knee strengthening and stretches as tolerated for knee pain    PT Home Exercise Plan Access code: [email or web address]: left wrist extension eccentric (red Theraband), wrist extensor stretch, left median and radial nerve glides (see HEP for variations given with minimal shoulder ROM needed), for knee: Theraband hip abd, supine SLR, hip bridge    Consulted and Agree with Plan of Care Patient           Patient will benefit from skilled therapeutic intervention in order to improve the following deficits and impairments:  Pain,Impaired UE functional use,Impaired flexibility,Decreased strength,Decreased activity tolerance,Decreased range of motion,Increased edema,Difficulty walking  Visit Diagnosis: Pain in left wrist  Chronic pain of left knee     Problem List Patient Active Problem List   Diagnosis Date Noted  . Hypercalcemia 08/14/2020  . Positive ANA (antinuclear antibody) 08/14/2020  . Vitamin D deficiency 05/15/2020  . CRP elevated 05/15/2020  . Bilateral shoulder pain 03/22/2020  . Left wrist tendinitis 03/22/2020  . Urinary incontinence 02/21/2020  . Allergic rhinitis 02/21/2020  . 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 Chest pressure 02/14/2019  . 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 Obesity (BMI 30.0-34.9) 07/13/2018  . Osteopenia 02/18/2018  . Prediabetes 11/12/2016  . Anxiety 03/08/2010  . IBS 03/08/2010  . Hyperlipidemia 05/05/2008  . Depression 05/05/2008  . GERD 05/05/2008    Beaulah Dinning, PT, DPT 12/04/20 3:20 PM  [place] [facility] [address] [place], [place], [address] Phone: [phone]   Fax:  [phone]  Name: Pam Butler MRN: [id number] Date of Birth: 09/25/1946

## 2020-12-07 ENCOUNTER — Ambulatory Visit: Payer: Medicare HMO

## 2020-12-07 ENCOUNTER — Other Ambulatory Visit: Payer: Self-pay

## 2020-12-07 DIAGNOSIS — M25562 Pain in left knee: Secondary | ICD-10-CM | POA: Diagnosis not present

## 2020-12-07 DIAGNOSIS — G8929 Other chronic pain: Secondary | ICD-10-CM | POA: Diagnosis not present

## 2020-12-07 DIAGNOSIS — M25532 Pain in left wrist: Secondary | ICD-10-CM | POA: Diagnosis not present

## 2020-12-07 NOTE — Therapy (Addendum)
[place], [place], [address] Phone: [phone]   Fax:  [phone]  Physical Therapy Treatment/Discharge  Patient Details  Name: Pam Butler MRN: [id number] Date of Birth: 22-Jun-1946 Referring Provider (PT): Hulan Saas, DO   Encounter Date: 12/07/2020   PT End of Session - 12/07/20 2155     Visit Number 7    Number of Visits 12    Date for PT Re-Evaluation 12/25/20    Authorization Type [employer] MCR, progress note by visit 10, recheck FOTO again but visit 10, no ionto    PT Start Time 1350    PT Stop Time 1434    PT Time Calculation (min) 44 min    Activity Tolerance Patient tolerated treatment well    Behavior During Therapy WFL for tasks assessed/performed             Past Medical History:  Diagnosis Date   Allergy    Anxiety    Cataract    Bil   Depression    History of bloody stools    in past/   History of muscle pain    Bil pain in bil arms and shoulders/ chronic since last year/left shoulder started this year   Loose stools    on and off/occasional blood in stool    Past Surgical History:  Procedure Laterality Date   ABDOMINAL HYSTERECTOMY     with bladder tack   APPENDECTOMY     BREAST BIOPSY Right 2001   benign   CHOLECYSTECTOMY     COLONOSCOPY     TUBAL LIGATION      There were no vitals filed for this visit.   Subjective Assessment - 12/07/20 2148     Subjective Pt reports after the last few sessions her pain was improved for about a day    Pertinent History chronic shoulder pain, history of anxiety and depression    Limitations Standing;Lifting;House hold activities;Walking    Patient Stated Goals Get wrist and knee better    Currently in Pain? Yes    Pain Score 4     Pain Location Wrist    Pain Orientation Left    Pain Descriptors / Indicators Aching;Sharp    Pain Type Chronic pain    Pain Onset More than a month ago    Pain Frequency Constant     Aggravating Factors  gripping, wrist use    Pain Relieving Factors rest    Pain Score 2    Pain Location Knee    Pain Orientation Posterior;Right    Pain Descriptors / Indicators Aching;Heaviness    Pain Onset More than a month ago    Pain Frequency Intermittent                               [facility] Adult PT Treatment/Exercise - 12/07/20 0001       Wrist Exercises   Other wrist exercises De Quervain's thumb stretch 3x, 30 sec      Manual Therapy   Manual Therapy Soft tissue mobilization;Passive ROM    Soft tissue mobilization STM including cross friction massage to the wrist extensors and thumb extensors and abductor tendons and musculotendinous juctions. Completed and then  instructed and assisted pt in active release technique to the wrist extensors and the thumb extensors and abdutor  PT Education - 12/07/20 2153     Education Details Active release technique lateral epicondtlitis and DeQuervain"s tendinitis    Person(s) Educated Patient    Methods Explanation;Demonstration;Tactile cues;Verbal cues    Comprehension Verbalized understanding;Returned demonstration;Verbal cues required;Tactile cues required;Need further instruction              PT Short Term Goals - 11/13/20 1319       PT SHORT TERM GOAL #1   Title Independent with initial HEP    Baseline HEP handout at eval    Time 3    Period Weeks    Status New      PT SHORT TERM GOAL #2   Title Increase left grip strength to 15 lbs. or greater to improve ability for gripping for chores at home    Baseline 7 lbs.    Time 3    Period Weeks    Status New               PT Long Term Goals - 12/04/20 1518       PT LONG TERM GOAL #1   Title Improve FOTO outcome measure score to 63% or greater function    Baseline 50%    Time 6    Period Weeks    Status On-going      PT LONG TERM GOAL #2   Title Left wrist strength at least grossly 4+/5 and left hip  strength at leats 4+/5 to 5/5 to improve ability for gripping and lifing activities for chores and to improve ability for stair navigation    Baseline left wrist grossly 4/5, see objective for hip MMTs    Time 6    Period Weeks    Status On-going      PT LONG TERM GOAL #3   Title Navigate stairs as needed for home and community mobility with left knee pain decreased at least 50% or greater from baseline    Baseline no pain at rest at eval but intermittently higher with activity    Time 6    Period Weeks    Status On-going      PT LONG TERM GOAL #4   Title Perform gripping for dressing, cooking and chores at home with left wrist pain 2/10 or less at worst    Baseline 2/10 pain at rest at eval but can increase to 8/10    Time 6    Period Weeks    Status On-going                   Plan - 12/07/20 2156     Clinical Impression Statement PT was completed for STM including cross friction massage to the wrist extensors and thumb extensors and abductor tendons and musculotendinous juctions. PT then provided, and instructed and assisted pt in active release technique to the wrist extensors and the thumb extensors and abductor. Pt returned demonstration. Pt is experiencing L arm and wrist pain relief with manual tecniques, but to date the relief is temporary.    Personal Factors and Comorbidities Comorbidity 2;Time since onset of injury/illness/exacerbation    Comorbidities see PMH    Examination-Activity Limitations Dressing;Bathing;Lift;Locomotion Level;Stairs;Stand    Examination-Participation Restrictions Meal Prep;Community Activity;Cleaning    Stability/Clinical Decision Making Evolving/Moderate complexity    Clinical Decision Making Moderate    Rehab Potential Good    PT Frequency 2x / week    PT Duration 6 weeks    PT Treatment/Interventions ADLs/Self Care Home Management;Cryotherapy;Ultrasound;Software engineer;Therapeutic  exercise;Patient/family  education;Manual techniques;Neuromuscular re-education;Functional mobility training;Therapeutic activities;Taping;Dry needling    PT Next Visit Plan [employer]-no ionto, continue progression wrist strengthening/eccentrics as tolerated, continue manual to wrist region,  hip/knee strengthening and stretches as tolerated for knee pain    PT Home Exercise Plan Access code: [email or web address]: left wrist extension eccentric (red Theraband), wrist extensor stretch, left median and radial nerve glides (see HEP for variations given with minimal shoulder ROM needed), for knee: Theraband hip abd, supine SLR, hip bridge    Consulted and Agree with Plan of Care Patient             Patient will benefit from skilled therapeutic intervention in order to improve the following deficits and impairments:  Pain,Impaired UE functional use,Impaired flexibility,Decreased strength,Decreased activity tolerance,Decreased range of motion,Increased edema,Difficulty walking  Visit Diagnosis: Pain in left wrist  Chronic pain of left knee     Problem List Patient Active Problem List   Diagnosis Date Noted   Hypercalcemia 08/14/2020   Positive ANA (antinuclear antibody) 08/14/2020   Vitamin D deficiency 05/15/2020   CRP elevated 05/15/2020   Bilateral shoulder pain 03/22/2020   Left wrist tendinitis 03/22/2020   Urinary incontinence 02/21/2020   Allergic rhinitis 02/21/2020   Thrombophlebitis 04/11/2019   Chest pressure 02/14/2019   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   Obesity (BMI 30.0-34.9) 07/13/2018   Osteopenia 02/18/2018   Prediabetes 11/12/2016   Anxiety 03/08/2010   IBS 03/08/2010   Hyperlipidemia 05/05/2008   Depression 05/05/2008   GERD 05/05/2008   Gar Ponto MS, PT 12/07/20 10:17 PM   [facility] [place], [place], [address] Phone: [phone]   Fax:  [phone]  Name: Pam Butler MRN: [id number] Date of Birth: Dec 22, 1945  PHYSICAL  THERAPY DISCHARGE SUMMARY  Visits from Start of Care: 7  Current functional level related to goals / functional outcomes: See above   Remaining deficits: See above  Education / Equipment: HEP  Patient agrees to discharge. Patient goals were not met. Patient is being discharged due to not returning since the last visit.   Carmine Carrozza MS, PT 05/16/21 11:40 AM

## 2020-12-11 ENCOUNTER — Ambulatory Visit: Payer: Medicare HMO | Admitting: Physical Therapy

## 2020-12-18 ENCOUNTER — Telehealth: Payer: Self-pay | Admitting: Family Medicine

## 2020-12-18 ENCOUNTER — Other Ambulatory Visit: Payer: Self-pay

## 2020-12-18 DIAGNOSIS — M25532 Pain in left wrist: Secondary | ICD-10-CM

## 2020-12-18 NOTE — Telephone Encounter (Signed)
Called patient and left voicemail for her to call [facility] to schedule MRI and that we would contact her once we have the results.

## 2020-12-18 NOTE — Telephone Encounter (Signed)
Patient called asking if Dr Tamala Julian could go ahead and order the MRI to be done for the pain in her left arm that goes from her thumb to her elbow?  Please advise.

## 2020-12-19 ENCOUNTER — Ambulatory Visit: Payer: Medicare HMO | Admitting: Physical Therapy

## 2020-12-21 ENCOUNTER — Ambulatory Visit: Payer: Medicare HMO

## 2021-01-01 ENCOUNTER — Ambulatory Visit: Payer: Medicare HMO

## 2021-01-15 NOTE — Progress Notes (Signed)
[place] Phone: [phone] Subjective:   Pam Butler, am serving as a scribe for Dr. Hulan Saas. This visit occurred during the SARS-CoV-2 public health emergency.  Safety protocols were in place, including screening questions prior to the visit, additional usage of staff PPE, and extensive cleaning of exam room while observing appropriate contact time as indicated for disinfecting solutions.   I'm seeing this patient by the request  of:  [name], MD  CC: Wrist pain follow-up  [id number]   11/28/2020 Patient continues in pain.  Has been to physical therapy a couple times.  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  Discussed with patient that today I do feel that patient has had some decrease in inflammation.  Very mild improvement in range of motion.  I do feel because of the longevity of this giving [place] trouble I do think advanced imaging would be warranted but patient declined.  Patient would not want any surgical intervention with her taking care of her mother still.  Patient wants to continue with physical therapy at this time.  Declined any type of injection for pain relief.  Encouraged her to continue to increase activity as tolerable.  Follow-up with me again in 6 weeks and at that time if continuing to have pain we will consider MRI we also discussed the possibility of a nerve conduction study which patient declined.  Total time including review and with patient today 32 minutes  Update 01/16/2021 Pam Butler is a 75 y.o. female coming in with complaint of left wrist pain. Patient states that she is about the same as last visit. Hand and thumb pain are worse than wrist pain. Patient believes that her wrist pain is secondary to an injury to the palm of her hand where she felt a pop in the hand. Continues to have pain in palm of L hand.  Unable to make a fist in the morning without assistance from her other hand. This symptom improves after she has used her hand. Would like MRI of hand instead of wrist.   Bilateral shoulder pain as well. Seen in December for shoulder pain. Positive ANA. Has been going to PT which helped her wrist and shoulders.  Patient states that the shoulders have been doing relatively well.   Patient is scheduled to have an MRI of the wrist on April 14 and 20, 2022.  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 Allergy   . Anxiety   . Cataract    Bil  . Depression   . History of bloody stools    in past/  . History of muscle pain    Bil pain in bil arms and shoulders/ chronic since last year/left shoulder started this year  . Loose stools    on and off/occasional blood in stool   Past Surgical History:  Procedure Laterality Date  . ABDOMINAL HYSTERECTOMY     with bladder tack  . APPENDECTOMY    . BREAST BIOPSY Right 2001   benign  . CHOLECYSTECTOMY    . COLONOSCOPY    . TUBAL LIGATION     Social History   Socioeconomic History  . Marital status: Married    Spouse name: Not on file  . Number of children: 2  . Years of  education: Not on file  . Highest education level: Not on file  Occupational History  . Occupation: retired  Tobacco Use  . Smoking status: Never Smoker  . Smokeless tobacco: Never Used  Vaping Use  . Vaping Use: Never used  Substance and Sexual Activity  . Alcohol use: Butler    Alcohol/week: 0.0 standard drinks  . Drug use: Butler  . Sexual activity: Never  Other Topics Concern  . Not on file  Social History Narrative   Exercise: none   Social Determinants of Health   Financial Resource Strain: Not on file  Food Insecurity: Not on file  Transportation Needs: Not on file  Physical Activity: Not on file  Stress: Not on file  Social Connections: Not on file   Allergies   Allergen Reactions  . Latex     Causes swelling around face  . Penicillins     Hives  . Ciprofloxacin     Itching and hives  . Codeine     Itching(note: She has taken codeine containing cough preparations since)   Family History  Problem Relation Age of Onset  . Other Mother        Prediabetes  . Colitis Mother   . Pneumonia Father   . Arthritis Sister   . Heart attack Paternal Grandmother 72  . Heart attack Maternal Uncle 67  . Colon cancer Neg Hx     Current Outpatient Medications (Endocrine & Metabolic):  .  predniSONE (DELTASONE) 20 MG tablet, Take 1 tablet (20 mg total) by mouth 2 (two) times daily.  Current Outpatient Medications (Cardiovascular):  .  ezetimibe (ZETIA) 10 MG tablet, TAKE 1 TABLET BY MOUTH EVERY DAY .  rosuvastatin (CRESTOR) 5 MG tablet, TAKE 1 TABLET (5 MG TOTAL) BY MOUTH 3 (THREE) TIMES A WEEK.  Current Outpatient Medications (Respiratory):  .  cetirizine (ZYRTEC) 10 MG tablet, Take 1 tablet (10 mg total) by mouth daily.  Current Outpatient Medications (Analgesics):  Marland Kitchen  Acetaminophen (TYLENOL EXTRA STRENGTH PO), Take by mouth. Take one prn .  aspirin EC 325 MG tablet, Take 325 mg by mouth as needed.   Current Outpatient Medications (Other):  .  calcium carbonate (CALCIUM 600) 600 MG TABS tablet, Take 1 tablet (600 mg total) by mouth daily with breakfast. .  calcium carbonate (TUMS - DOSED IN MG ELEMENTAL CALCIUM) 500 MG chewable tablet, Chew 1 tablet by mouth as needed for indigestion or heartburn. .  chlorhexidine (PERIDEX) 0.12 % solution,  .  Cholecalciferol (VITAMIN D) 50 MCG (2000 UT) CAPS, 1 cap daily .  cyclobenzaprine (FLEXERIL) 5 MG tablet, Take 1 tablet (5 mg total) by mouth at bedtime as needed for muscle spasms. .  Melatonin 10 MG CAPS, Take by mouth as needed. .  Multiple Vitamins-Minerals (WOMENS MULTI VITAMIN & MINERAL PO), Take by mouth. Centrum Womens MVI-Take one daily .  sertraline (ZOLOFT) 50 MG tablet, TAKE 1 TABLET BY MOUTH  EVERYDAY AT BEDTIME   Reviewed prior external information including notes and imaging from  primary care provider As well as notes that were available from care everywhere and other healthcare systems.  Past medical history, social, surgical and family history all reviewed in electronic medical record.  Butler pertanent information unless stated regarding to the chief complaint.   Review of Systems:  Butler headache, visual changes, nausea, vomiting, diarrhea, constipation, dizziness, abdominal pain, skin rash, fevers, chills, night sweats, weight loss, swollen lymph nodes, body aches, joint swelling, chest pain, shortness of breath, mood changes.  POSITIVE muscle aches  Objective  Blood pressure 122/72, pulse 64, height 5\' 2"  (1.575 m), weight 174 lb (78.9 kg), SpO2 98 %.   General: Butler apparent distress alert and oriented x3 mood and affect normal, dressed appropriately.  HEENT: Pupils equal, extraocular movements intact  Respiratory: Patient's speak in full sentences and does not appear short of breath  Cardiovascular: Butler lower extremity edema, non tender, Butler erythema  Gait normal with good balance and coordination.  MSK: Left wrist exam shows patient continues to have pain and swelling over the dorsal aspect of the wrist.  Pain over the TFCC.  Limited extension of only 7 degrees.  Good grip strength noted.  Shoulder exam shows the patient has more tenderness over the acromioclavicular joint noted.  Patient has 4 out of 5 strength in the rotator cuff but symmetric.  Butler crepitus noted.  Limited musculoskeletal ultrasound was performed and interpreted by me Lyndal Pulley  Patient still has significant swelling noted of the tendon sheath with effusion in the third dorsal compartment.  Patient also has significant irregularity noted of the TFCC but patient does have some decrease in the amount of hypoechoic changes in this vicinity.  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    Impression and Recommendations:     The above documentation has been reviewed and is accurate and complete Lyndal Pulley, DO

## 2021-01-16 ENCOUNTER — Ambulatory Visit: Payer: Self-pay

## 2021-01-16 ENCOUNTER — Ambulatory Visit: Payer: Medicare HMO | Admitting: Family Medicine

## 2021-01-16 ENCOUNTER — Encounter: Payer: Self-pay | Admitting: Family Medicine

## 2021-01-16 ENCOUNTER — Other Ambulatory Visit: Payer: Self-pay

## 2021-01-16 VITALS — BP 122/72 | HR 64 | Ht 62.0 in | Wt 174.0 lb

## 2021-01-16 DIAGNOSIS — M25532 Pain in left wrist: Secondary | ICD-10-CM

## 2021-01-16 DIAGNOSIS — M25512 Pain in left shoulder: Secondary | ICD-10-CM | POA: Diagnosis not present

## 2021-01-16 DIAGNOSIS — G8929 Other chronic pain: Secondary | ICD-10-CM

## 2021-01-16 DIAGNOSIS — M778 Other enthesopathies, not elsewhere classified: Secondary | ICD-10-CM

## 2021-01-16 DIAGNOSIS — M25511 Pain in right shoulder: Secondary | ICD-10-CM | POA: Diagnosis not present

## 2021-01-16 NOTE — Patient Instructions (Signed)
Get MRI  We will call you with results We will talk about care and focus on shoulders at follow up See me again in 4-5 weeks

## 2021-01-17 ENCOUNTER — Encounter: Payer: Self-pay | Admitting: Family Medicine

## 2021-01-17 NOTE — Assessment & Plan Note (Signed)
Still has inflammation mostly of the third dorsal compartment that is the most concerning aspect than anything else.  In addition to this patient continues to have pain over the TFCC somewhat.  Patient has limited range of motion but has been working with formal physical therapy.  Awaiting MRI on the 14th to further evaluate.  Depending on this we will discuss the need for surgical intervention versus possible injections

## 2021-01-17 NOTE — Assessment & Plan Note (Addendum)
Chronic problem.  Has done fairly well.  Likely some mild arthritic changes noted.  Most of the patient's pain is over the acromioclavicular joint.  Can consider potential injections if needed.  Has had a positive ANA previously.  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

## 2021-01-24 ENCOUNTER — Ambulatory Visit
Admission: RE | Admit: 2021-01-24 | Discharge: 2021-01-24 | Disposition: A | Discharge status: Home / Self Care | Payer: Medicare HMO | Source: Ambulatory Visit | Attending: Family Medicine | Admitting: Family Medicine

## 2021-01-24 ENCOUNTER — Other Ambulatory Visit: Payer: Self-pay

## 2021-01-24 DIAGNOSIS — S63592A Other specified sprain of left wrist, initial encounter: Secondary | ICD-10-CM | POA: Diagnosis not present

## 2021-01-24 DIAGNOSIS — M25532 Pain in left wrist: Secondary | ICD-10-CM | POA: Diagnosis not present

## 2021-01-24 MED ORDER — IOPAMIDOL (ISOVUE-M 200) INJECTION 41%
2.0000 mL | Freq: Once | INTRAMUSCULAR | Status: AC
  Administered 2021-01-24: 2 mL via INTRA_ARTICULAR

## 2021-01-29 ENCOUNTER — Other Ambulatory Visit: Payer: Self-pay

## 2021-01-29 DIAGNOSIS — S63592A Other specified sprain of left wrist, initial encounter: Secondary | ICD-10-CM

## 2021-02-13 DIAGNOSIS — S63502A Unspecified sprain of left wrist, initial encounter: Secondary | ICD-10-CM | POA: Diagnosis not present

## 2021-02-13 DIAGNOSIS — M65842 Other synovitis and tenosynovitis, left hand: Secondary | ICD-10-CM | POA: Diagnosis not present

## 2021-02-13 DIAGNOSIS — M25532 Pain in left wrist: Secondary | ICD-10-CM | POA: Diagnosis not present

## 2021-02-13 DIAGNOSIS — M65849 Other synovitis and tenosynovitis, unspecified hand: Secondary | ICD-10-CM | POA: Diagnosis not present

## 2021-02-13 DIAGNOSIS — M659 Synovitis and tenosynovitis, unspecified: Secondary | ICD-10-CM | POA: Insufficient documentation

## 2021-02-13 DIAGNOSIS — M25561 Pain in right knee: Secondary | ICD-10-CM | POA: Insufficient documentation

## 2021-02-13 DIAGNOSIS — M25511 Pain in right shoulder: Secondary | ICD-10-CM | POA: Diagnosis not present

## 2021-02-13 DIAGNOSIS — M65949 Unspecified synovitis and tenosynovitis, unspecified hand: Secondary | ICD-10-CM | POA: Insufficient documentation

## 2021-02-14 NOTE — Progress Notes (Deleted)
[place] Phone: [phone] Subjective:    I'm seeing this patient by the request  of:  [name], MD  CC:   [id number]   01/16/2021 Chronic problem.  Has done fairly well.  Likely some mild arthritic changes noted.  Most of the patient's pain is over the acromioclavicular joint.  Can consider potential injections if needed.  Has had a positive ANA previously.  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  Still has inflammation mostly of the third dorsal compartment that is the most concerning aspect than anything else.  In addition to this patient continues to have pain over the TFCC somewhat.  Patient has limited range of motion but has been working with formal physical therapy.  Awaiting MRI on the 14th to further evaluate.  Depending on this we will discuss the need for surgical intervention versus possible injections  Update 02/15/2021 Pam Butler is a 75 y.o. female coming in with complaint of L wrist and B shoulder pain.   Onset-  Location Duration-  Character- Aggravating factors- Reliving factors-  Therapies tried-  Severity-     Past Medical History:  Diagnosis Date  . Allergy   . Anxiety   . Cataract    Bil  . Depression   . History of bloody stools    in past/  . History of muscle pain    Bil pain in bil arms and shoulders/ chronic since last year/left shoulder started this year  . Loose stools    on and off/occasional blood in stool   Past Surgical History:  Procedure Laterality Date  . ABDOMINAL HYSTERECTOMY     with bladder tack  . APPENDECTOMY    . BREAST BIOPSY Right 2001   benign  . CHOLECYSTECTOMY    . COLONOSCOPY    . TUBAL LIGATION     Social History   Socioeconomic History  . Marital status: Married    Spouse name: Not on file  . Number of children: 2  . Years of  education: Not on file  . Highest education level: Not on file  Occupational History  . Occupation: retired  Tobacco Use  . Smoking status: Never Smoker  . Smokeless tobacco: Never Used  Vaping Use  . Vaping Use: Never used  Substance and Sexual Activity  . Alcohol use: No    Alcohol/week: 0.0 standard drinks  . Drug use: No  . Sexual activity: Never  Other Topics Concern  . Not on file  Social History Narrative   Exercise: none   Social Determinants of Health   Financial Resource Strain: Not on file  Food Insecurity: Not on file  Transportation Needs: Not on file  Physical Activity: Not on file  Stress: Not on file  Social Connections: Not on file   Allergies  Allergen Reactions  . Latex     Causes swelling around face  . Penicillins     Hives  . Ciprofloxacin     Itching and hives  . Codeine     Itching(note: She has taken codeine containing cough preparations since)   Family History  Problem Relation Age of Onset  . Other Mother        Prediabetes  . Colitis Mother   . Pneumonia Father   . Arthritis Sister   . Heart attack Paternal Grandmother 32  .  Heart attack Maternal Uncle 67  . Colon cancer Neg Hx     Current Outpatient Medications (Endocrine & Metabolic):  .  predniSONE (DELTASONE) 20 MG tablet, Take 1 tablet (20 mg total) by mouth 2 (two) times daily.  Current Outpatient Medications (Cardiovascular):  .  ezetimibe (ZETIA) 10 MG tablet, TAKE 1 TABLET BY MOUTH EVERY DAY .  rosuvastatin (CRESTOR) 5 MG tablet, TAKE 1 TABLET (5 MG TOTAL) BY MOUTH 3 (THREE) TIMES A WEEK.  Current Outpatient Medications (Respiratory):  .  cetirizine (ZYRTEC) 10 MG tablet, Take 1 tablet (10 mg total) by mouth daily.  Current Outpatient Medications (Analgesics):  Marland Kitchen  Acetaminophen (TYLENOL EXTRA STRENGTH PO), Take by mouth. Take one prn .  aspirin EC 325 MG tablet, Take 325 mg by mouth as needed.   Current Outpatient Medications (Other):  .  calcium carbonate  (CALCIUM 600) 600 MG TABS tablet, Take 1 tablet (600 mg total) by mouth daily with breakfast. .  calcium carbonate (TUMS - DOSED IN MG ELEMENTAL CALCIUM) 500 MG chewable tablet, Chew 1 tablet by mouth as needed for indigestion or heartburn. .  chlorhexidine (PERIDEX) 0.12 % solution,  .  Cholecalciferol (VITAMIN D) 50 MCG (2000 UT) CAPS, 1 cap daily .  cyclobenzaprine (FLEXERIL) 5 MG tablet, Take 1 tablet (5 mg total) by mouth at bedtime as needed for muscle spasms. .  Melatonin 10 MG CAPS, Take by mouth as needed. .  Multiple Vitamins-Minerals (WOMENS MULTI VITAMIN & MINERAL PO), Take by mouth. Centrum Womens MVI-Take one daily .  sertraline (ZOLOFT) 50 MG tablet, TAKE 1 TABLET BY MOUTH EVERYDAY AT BEDTIME   Reviewed prior external information including notes and imaging from  primary care provider As well as notes that were available from care everywhere and other healthcare systems.  Past medical history, social, surgical and family history all reviewed in electronic medical record.  No pertanent information unless stated regarding to the chief complaint.   Review of Systems:  No headache, visual changes, nausea, vomiting, diarrhea, constipation, dizziness, abdominal pain, skin rash, fevers, chills, night sweats, weight loss, swollen lymph nodes, body aches, joint swelling, chest pain, shortness of breath, mood changes. POSITIVE muscle aches  Objective  There were no vitals taken for this visit.   General: No apparent distress alert and oriented x3 mood and affect normal, dressed appropriately.  HEENT: Pupils equal, extraocular movements intact  Respiratory: Patient's speak in full sentences and does not appear short of breath  Cardiovascular: No lower extremity edema, non tender, no erythema  Gait normal with good balance and coordination.  MSK:  Non tender with full range of motion and good stability and symmetric strength and tone of shoulders, elbows, wrist, hip, knee and ankles  bilaterally.     Impression and Recommendations:     The above documentation has been reviewed and is accurate and complete Pam Butler

## 2021-02-15 ENCOUNTER — Ambulatory Visit: Payer: Medicare HMO | Admitting: Family Medicine

## 2021-02-26 ENCOUNTER — Other Ambulatory Visit: Payer: Self-pay | Admitting: Internal Medicine

## 2021-03-07 ENCOUNTER — Other Ambulatory Visit: Payer: Self-pay | Admitting: Internal Medicine

## 2021-03-07 DIAGNOSIS — Z1231 Encounter for screening mammogram for malignant neoplasm of breast: Secondary | ICD-10-CM

## 2021-03-13 DIAGNOSIS — M65849 Other synovitis and tenosynovitis, unspecified hand: Secondary | ICD-10-CM | POA: Insufficient documentation

## 2021-03-13 DIAGNOSIS — M25532 Pain in left wrist: Secondary | ICD-10-CM | POA: Diagnosis not present

## 2021-03-13 DIAGNOSIS — M65842 Other synovitis and tenosynovitis, left hand: Secondary | ICD-10-CM | POA: Diagnosis not present

## 2021-05-07 ENCOUNTER — Other Ambulatory Visit: Payer: Self-pay

## 2021-05-07 ENCOUNTER — Ambulatory Visit
Admission: RE | Admit: 2021-05-07 | Discharge: 2021-05-07 | Disposition: A | Discharge status: Home / Self Care | Payer: Medicare HMO | Source: Ambulatory Visit | Attending: Internal Medicine | Admitting: Internal Medicine

## 2021-05-07 DIAGNOSIS — Z1231 Encounter for screening mammogram for malignant neoplasm of breast: Secondary | ICD-10-CM

## 2021-05-16 ENCOUNTER — Other Ambulatory Visit: Payer: Self-pay | Admitting: Internal Medicine

## 2021-05-26 NOTE — Progress Notes (Signed)
Subjective:    Patient ID: Pam Butler, female    DOB: 1945/11/03, 75 y.o.   MRN: [id number]   This visit occurred during the SARS-CoV-2 public health emergency.  Safety protocols were in place, including screening questions prior to the visit, additional usage of staff PPE, and extensive cleaning of exam room while observing appropriate contact time as indicated for disinfecting solutions.    HPI She is here for a physical exam.   Since she was here last she did have to put her mom who has dementia into a nursing home and that has been very stressful for her.  She is having some increased anxiety related to that.  Medications and allergies reviewed with patient and updated if appropriate.  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 Take one prn     aspirin EC 325 MG tablet Take 325 mg by mouth as needed.     calcium carbonate (CALCIUM 600) 600 MG TABS tablet Take 1 tablet (600 mg total) by mouth daily with breakfast. 30 tablet    cetirizine (ZYRTEC) 10 MG tablet Take 1 tablet (10 mg total) by mouth daily. 90 tablet 1   Cholecalciferol (VITAMIN D) 50 MCG (2000 UT) CAPS 1 cap daily 30 capsule 0   ezetimibe (ZETIA) 10 MG tablet TAKE 1 TABLET BY MOUTH EVERY DAY 90  tablet 1   Melatonin 10 MG CAPS Take by mouth as needed.     Multiple Vitamins-Minerals (WOMENS MULTI VITAMIN & MINERAL PO) Take by mouth. Centrum Womens MVI-Take one daily     No current facility-administered medications on file prior to visit.    Past Medical History:  Diagnosis Date   Allergy    Anxiety    Cataract    Bil   Depression    History of bloody stools    in past/   History of muscle pain    Bil pain in bil arms and shoulders/ chronic since last year/left shoulder started this year   Loose stools    on and off/occasional blood in stool    Past Surgical History:  Procedure Laterality Date   ABDOMINAL HYSTERECTOMY     with bladder tack   APPENDECTOMY     BREAST BIOPSY Right 2001   benign   CHOLECYSTECTOMY     COLONOSCOPY     TUBAL LIGATION      Social History   Socioeconomic History   Marital status: Married    Spouse name: Not on file   Number of children: 2   Years of education: Not on file   Highest education level: Not on file  Occupational History   Occupation: retired  Tobacco Use   Smoking status: Never   Smokeless tobacco: Never  Vaping Use   Vaping Use: Never used  Substance and Sexual Activity   Alcohol use: No    Alcohol/week: 0.0 standard drinks   Drug use: No   Sexual activity: Never  Other Topics Concern   Not on file  Social History Narrative   Exercise: none   Social Determinants of Health   Financial Resource Strain: Not on file  Food Insecurity: Not on file  Transportation Needs: Not on file  Physical Activity: Not on file  Stress: Not on file  Social Connections: Not on file    Family History  Problem Relation Age of Onset   Other Mother        Prediabetes   Colitis Mother    Pneumonia Father    Arthritis Sister    Heart attack Paternal Grandmother 26   Heart attack Maternal Uncle 57   Colon cancer Neg Hx     Review of Systems  Constitutional:  Negative for chills and fever.  Eyes:  Negative for visual  disturbance.  Respiratory:  Positive for shortness of breath (noticed by others - she does not notice). Negative for cough and wheezing.   Cardiovascular:  Positive for leg swelling. Negative for chest pain and palpitations.  Gastrointestinal:  Positive for constipation and diarrhea (stools vary between constipation and diarrhea). Negative for abdominal pain, blood in stool and nausea.       Some gerd in last month  Genitourinary:  Positive for frequency and urgency. Negative for dysuria.  Musculoskeletal:  Positive for arthralgias (knees, ankles, wrists, elbows, shoulder, fingers). Negative for back pain (started last week - has resolved).  Skin:  Negative for rash.  Neurological:  Positive for light-headedness (occ). Negative for headaches.  Psychiatric/Behavioral:  Positive for dysphoric mood. The patient is nervous/anxious (related to putting mom in a NH).       Objective:   Vitals:   05/27/21 1303  BP: 120/72  Pulse: 67  Temp: 98.1 F (36.7 C)  SpO2: 99%   Filed Weights   05/27/21 1303  Weight: 182 lb (82.6 kg)   Body mass index is 33.29 kg/m.  BP Readings from Last 3 Encounters:  05/27/21 120/72  01/16/21 122/72  11/28/20 120/70    Wt Readings from Last 3 Encounters:  05/27/21 182 lb (82.6 kg)  01/16/21 174 lb (78.9 kg)  11/28/20 171 lb (77.6 kg)    Depression screen [facility] 2/9 05/27/2021 05/15/2020 03/25/2019 07/13/2018 03/18/2018  Decreased Interest 2 2 0 2 0  Down, Depressed, Hopeless '2 2 1 2 2  '$ PHQ - 2 Score '4 4 1 4 2  '$ Altered sleeping '3 2 1 3 2  '$ Tired, decreased energy '2 2 1 2 2  '$ Change in appetite 1 3 0 3 0  Feeling bad or failure about yourself  3 3 0 2 1  Trouble concentrating 1 3 0 3 0  Moving slowly or fidgety/restless 2 3 0 1 0  Suicidal thoughts 0 0 0 0 0  PHQ-9 Score '16 20 3 18 7  '$ Difficult doing work/chores Very difficult Somewhat difficult Not difficult at all - Not difficult at all    GAD 7 : Generalized Anxiety Score 05/27/2021 07/13/2018  Nervous,  Anxious, on Edge 3 3  Control/stop worrying 2 3  Worry too much - different things 2 3  Trouble relaxing 2 3  Restless 2 3  Easily annoyed or irritable 2 1  Afraid - awful might happen 2 1  Total GAD 7 Score 15 17  Anxiety Difficulty Somewhat difficult -  Physical Exam Constitutional: She appears well-developed and well-nourished. No distress.  HENT:  Head: Normocephalic and atraumatic.  Right Ear: External ear normal. Normal ear canal and TM Left Ear: External ear normal.  Normal ear canal and TM Mouth/Throat: Oropharynx is clear and moist.  Eyes: Conjunctivae and EOM are normal.  Neck: Neck supple. No tracheal deviation present. No thyromegaly present.  No carotid bruit  Cardiovascular: Normal rate, regular rhythm and normal heart sounds.   No murmur heard.  No edema. Pulmonary/Chest: Effort normal and breath sounds normal. No respiratory distress. She has no wheezes. She has no rales.  Breast: deferred   Abdominal: Soft. She exhibits no distension. There is no tenderness. Musculoskeletal: No joint deformities.  No synovitis Lymphadenopathy: She has no cervical adenopathy.  Skin: Skin is warm and dry. She is not diaphoretic.  Psychiatric: She has a normal mood and affect. Her behavior is normal.     Lab Results  Component Value Date   WBC 8.8 07/03/2020   HGB 12.9 07/03/2020   HCT 37.3 07/03/2020   PLT 276 07/03/2020   GLUCOSE 86 07/03/2020   CHOL 259 (H) 05/16/2020   TRIG 491 (H) 05/16/2020   HDL 44 (L) 05/16/2020   LDLDIRECT 107.0 02/23/2020   [place]  05/16/2020     Comment:     . LDL cholesterol not calculated. Triglyceride levels greater than 400 mg/dL invalidate calculated LDL results. . Reference range: <100 . Desirable range <100 mg/dL for primary prevention;   <70 mg/dL for patients with CHD or diabetic patients  with > or = 2 CHD risk factors. Marland Kitchen LDL-C is now calculated using the Martin-Hopkins  calculation, which is a validated novel method  providing  better accuracy than the Friedewald equation in the  estimation of LDL-C.  Cresenciano Genre et al. Annamaria Helling. [id number]): 2061-2068  (http://education.[facility].com/faq/FAQ164)    ALT 21 07/03/2020   AST 25 07/03/2020   NA 139 07/03/2020   K 4.4 07/03/2020   CL 104 07/03/2020   CREATININE 1.12 (H) 07/03/2020   BUN 17 07/03/2020   CO2 26 07/03/2020   TSH 2.81 02/23/2020   HGBA1C 5.6 02/23/2020         Assessment & Plan:   Physical exam: Screening blood work  ordered Exercise  none - encouraged regular exercise Weight  encouraged weight loss-discussed getting back on her diet and she was doing so well with and losing weight Substance abuse  none   Reviewed recommended immunizations.   Health Maintenance  Topic Date Due   COVID-19 Vaccine (1) Never done   Zoster Vaccines- Shingrix (1 of 2) Never done   DEXA SCAN  02/17/2021   INFLUENZA VACCINE  05/13/2021   TETANUS/TDAP  05/27/2022 (Originally 07/01/2015)   COLONOSCOPY (Pts 45-38yr Insurance coverage will need to be confirmed)  03/23/2024   Hepatitis C Screening  Completed   PNA vac Low Risk Adult  Completed   HPV VACCINES  Aged Out          See Problem List for Assessment and Plan of chronic medical problems.

## 2021-05-27 ENCOUNTER — Other Ambulatory Visit: Payer: Self-pay

## 2021-05-27 ENCOUNTER — Ambulatory Visit (INDEPENDENT_AMBULATORY_CARE_PROVIDER_SITE_OTHER): Payer: Medicare HMO | Admitting: Internal Medicine

## 2021-05-27 ENCOUNTER — Encounter: Payer: Self-pay | Admitting: Internal Medicine

## 2021-05-27 VITALS — BP 120/72 | HR 67 | Temp 98.1°F | Ht 62.0 in | Wt 182.0 lb

## 2021-05-27 DIAGNOSIS — Z0000 Encounter for general adult medical examination without abnormal findings: Secondary | ICD-10-CM

## 2021-05-27 DIAGNOSIS — E7849 Other hyperlipidemia: Secondary | ICD-10-CM | POA: Diagnosis not present

## 2021-05-27 DIAGNOSIS — R7303 Prediabetes: Secondary | ICD-10-CM

## 2021-05-27 DIAGNOSIS — F419 Anxiety disorder, unspecified: Secondary | ICD-10-CM

## 2021-05-27 DIAGNOSIS — R32 Unspecified urinary incontinence: Secondary | ICD-10-CM | POA: Diagnosis not present

## 2021-05-27 DIAGNOSIS — R69 Illness, unspecified: Secondary | ICD-10-CM | POA: Diagnosis not present

## 2021-05-27 DIAGNOSIS — F3289 Other specified depressive episodes: Secondary | ICD-10-CM

## 2021-05-27 DIAGNOSIS — E559 Vitamin D deficiency, unspecified: Secondary | ICD-10-CM | POA: Diagnosis not present

## 2021-05-27 DIAGNOSIS — M2550 Pain in unspecified joint: Secondary | ICD-10-CM | POA: Diagnosis not present

## 2021-05-27 DIAGNOSIS — M85851 Other specified disorders of bone density and structure, right thigh: Secondary | ICD-10-CM

## 2021-05-27 MED ORDER — EZETIMIBE 10 MG PO TABS: 10.0000 mg | ORAL_TABLET | Freq: Every day | ORAL | 3 refills | Status: DC

## 2021-05-27 NOTE — Assessment & Plan Note (Signed)
Chronic She states chronic urinary urgency and incontinence She did have a bladder sling in the past Wonders what can be done about her current symptoms.  Also has occasional fecal incontinence Discussed pelvic physical therapy-she deferred Will refer to uro-GYN

## 2021-05-27 NOTE — Assessment & Plan Note (Signed)
Chronic Taking vitamin D daily Check vitamin D level  

## 2021-05-27 NOTE — Assessment & Plan Note (Signed)
Chronic Questioning today with PHQ-9 does show some depression Most of her symptoms are related to putting her mother in a nursing home She does not feel that she needs any medication at this time Encouraged regular exercise

## 2021-05-27 NOTE — Assessment & Plan Note (Signed)
Chronic Check a1c Low sugar / carb diet Stressed regular exercise  

## 2021-05-27 NOTE — Assessment & Plan Note (Signed)
Chronic Continue zetia 10 mg daily Check lipid, cmp, tsh Stressed regular exercise, healthy diet

## 2021-05-27 NOTE — Assessment & Plan Note (Signed)
Chronic DEXA due-ordered Encouraged regular exercise Continue calcium 600 mg daily and vitamin D Will check vitamin D level

## 2021-05-27 NOTE — Assessment & Plan Note (Signed)
Chronic She does have some anxiety She stopped taking sertraline and does not feel that she needs it Most of her anxiety now is related to putting her mother in a nursing home-she does not feel that she needs any medication Encouraged regular exercise

## 2021-05-27 NOTE — Assessment & Plan Note (Signed)
Chronic Still having polyarthralgia and did have a positive ANA last year with titer 1-160 Will recheck ANA, ESR, CRP, RF and CCP given that she is still having diffuse joint pain If or concerning for autoimmune arthritis Will refer to rheumatology for further evaluation

## 2021-05-27 NOTE — Assessment & Plan Note (Addendum)
Chronic Did improve with last blood check CMP

## 2021-05-27 NOTE — Patient Instructions (Addendum)
Consider getting the shingles vaccine and tetanus vaccine at your pharmacy.   Blood work was ordered.     Medications changes include :   none  Your prescription(s) have been submitted to your pharmacy. Please take as directed and contact our office if you believe you are having problem(s) with the medication(s).   A referral was ordered for uro-gyn at [facility].      Someone from their office will call you to schedule an appointment.     Please followup in 1 year     Health Maintenance, Female Adopting a healthy lifestyle and getting preventive care are important in promoting health and wellness. Ask your health care provider about: The right schedule for you to have regular tests and exams. Things you can do on your own to prevent diseases and keep yourself healthy. What should I know about diet, weight, and exercise? Eat a healthy diet  Eat a diet that includes plenty of vegetables, fruits, low-fat dairy products, and lean protein. Do not eat a lot of foods that are high in solid fats, added sugars, or sodium.  Maintain a healthy weight Body mass index (BMI) is used to identify weight problems. It estimates body fat based on height and weight. Your health care provider can help determineyour BMI and help you achieve or maintain a healthy weight. Get regular exercise Get regular exercise. This is one of the most important things you can do for your health. Most adults should: Exercise for at least 150 minutes each week. The exercise should increase your heart rate and make you sweat (moderate-intensity exercise). Do strengthening exercises at least twice a week. This is in addition to the moderate-intensity exercise. Spend less time sitting. Even light physical activity can be beneficial. Watch cholesterol and blood lipids Have your blood tested for lipids and cholesterol at 76 years of age, then havethis test every 5 years. Have your cholesterol levels checked more often  if: Your lipid or cholesterol levels are high. You are older than 76 years of age. You are at high risk for heart disease. What should I know about cancer screening? Depending on your health history and family history, you may need to have cancer screening at various ages. This may include screening for: Breast cancer. Cervical cancer. Colorectal cancer. Skin cancer. Lung cancer. What should I know about heart disease, diabetes, and high blood pressure? Blood pressure and heart disease High blood pressure causes heart disease and increases the risk of stroke. This is more likely to develop in people who have high blood pressure readings, are of African descent, or are overweight. Have your blood pressure checked: Every 3-5 years if you are 18-18 years of age. Every year if you are 24 years old or older. Diabetes Have regular diabetes screenings. This checks your fasting blood sugar level. Have the screening done: Once every three years after age 82 if you are at a normal weight and have a low risk for diabetes. More often and at a younger age if you are overweight or have a high risk for diabetes. What should I know about preventing infection? Hepatitis B If you have a higher risk for hepatitis B, you should be screened for this virus. Talk with your health care provider to find out if you are at risk forhepatitis B infection. Hepatitis C Testing is recommended for: Everyone born from [birth year] through 1966. Anyone with known risk factors for hepatitis C. Sexually transmitted infections (STIs) Get screened for STIs, including gonorrhea  and chlamydia, if: You are sexually active and are younger than 76 years of age. You are older than 76 years of age and your health care provider tells you that you are at risk for this type of infection. Your sexual activity has changed since you were last screened, and you are at increased risk for chlamydia or gonorrhea. Ask your health care provider if  you are at risk. Ask your health care provider about whether you are at high risk for HIV. Your health care provider may recommend a prescription medicine to help prevent HIV infection. If you choose to take medicine to prevent HIV, you should first get tested for HIV. You should then be tested every 3 months for as long as you are taking the medicine. Pregnancy If you are about to stop having your period (premenopausal) and you may become pregnant, seek counseling before you get pregnant. Take 400 to 800 micrograms (mcg) of folic acid every day if you become pregnant. Ask for birth control (contraception) if you want to prevent pregnancy. Osteoporosis and menopause Osteoporosis is a disease in which the bones lose minerals and strength with aging. This can result in bone fractures. If you are 6 years old or older, or if you are at risk for osteoporosis and fractures, ask your health care provider if you should: Be screened for bone loss. Take a calcium or vitamin D supplement to lower your risk of fractures. Be given hormone replacement therapy (HRT) to treat symptoms of menopause. Follow these instructions at home: Lifestyle Do not use any products that contain nicotine or tobacco, such as cigarettes, e-cigarettes, and chewing tobacco. If you need help quitting, ask your health care provider. Do not use street drugs. Do not share needles. Ask your health care provider for help if you need support or information about quitting drugs. Alcohol use Do not drink alcohol if: Your health care provider tells you not to drink. You are pregnant, may be pregnant, or are planning to become pregnant. If you drink alcohol: Limit how much you use to 0-1 drink a day. Limit intake if you are breastfeeding. Be aware of how much alcohol is in your drink. In the [place] one drink equals one 12 oz bottle of beer (355 mL), one 5 oz glass of wine (148 mL), or one 1 oz glass of hard liquor (44 mL). General  instructions Schedule regular health, dental, and eye exams. Stay current with your vaccines. Tell your health care provider if: You often feel depressed. You have ever been abused or do not feel safe at home. Summary Adopting a healthy lifestyle and getting preventive care are important in promoting health and wellness. Follow your health care provider's instructions about healthy diet, exercising, and getting tested or screened for diseases. Follow your health care provider's instructions on monitoring your cholesterol and blood pressure. This information is not intended to replace advice given to you by your health care provider. Make sure you discuss any questions you have with your healthcare provider. Document Revised: 09/22/2018 Document Reviewed: 09/22/2018 Elsevier Patient Education  2022 [employer].

## 2021-05-31 ENCOUNTER — Other Ambulatory Visit: Payer: Self-pay

## 2021-05-31 ENCOUNTER — Other Ambulatory Visit (INDEPENDENT_AMBULATORY_CARE_PROVIDER_SITE_OTHER): Payer: Medicare HMO

## 2021-05-31 DIAGNOSIS — E7849 Other hyperlipidemia: Secondary | ICD-10-CM | POA: Diagnosis not present

## 2021-05-31 DIAGNOSIS — R7303 Prediabetes: Secondary | ICD-10-CM | POA: Diagnosis not present

## 2021-05-31 DIAGNOSIS — E559 Vitamin D deficiency, unspecified: Secondary | ICD-10-CM | POA: Diagnosis not present

## 2021-05-31 DIAGNOSIS — M2550 Pain in unspecified joint: Secondary | ICD-10-CM | POA: Diagnosis not present

## 2021-05-31 DIAGNOSIS — Z0000 Encounter for general adult medical examination without abnormal findings: Secondary | ICD-10-CM

## 2021-05-31 DIAGNOSIS — R7689 Other specified abnormal immunological findings in serum: Secondary | ICD-10-CM

## 2021-05-31 DIAGNOSIS — R768 Other specified abnormal immunological findings in serum: Secondary | ICD-10-CM

## 2021-05-31 LAB — COMPREHENSIVE METABOLIC PANEL
ALT: 17 U/L (ref 0–35)
AST: 18 U/L (ref 0–37)
Albumin: 4.3 g/dL (ref 3.5–5.2)
Alkaline Phosphatase: 61 U/L (ref 39–117)
BUN: 21 mg/dL (ref 6–23)
CO2: 21 mEq/L (ref 19–32)
Calcium: 9.7 mg/dL (ref 8.4–10.5)
Chloride: 105 mEq/L (ref 96–112)
Creatinine, Ser: 0.89 mg/dL (ref 0.40–1.20)
GFR: 63.07 mL/min (ref >60.00)
Glucose, Bld: 83 mg/dL (ref 70–99)
Potassium: 3.9 mEq/L (ref 3.5–5.1)
Sodium: 137 mEq/L (ref 135–145)
Total Bilirubin: 0.4 mg/dL (ref 0.2–1.2)
Total Protein: 7.4 g/dL (ref 6.0–8.3)

## 2021-05-31 LAB — VITAMIN D 25 HYDROXY (VIT D DEFICIENCY, FRACTURES): VITD: 35.51 ng/mL (ref 30.00–100.00)

## 2021-05-31 LAB — CBC WITH DIFFERENTIAL/PLATELET
Basophils Absolute: 0 10*3/uL (ref 0.0–0.1)
Basophils Relative: 0.7 % (ref 0.0–3.0)
Eosinophils Absolute: 0.1 10*3/uL (ref 0.0–0.7)
Eosinophils Relative: 2.1 % (ref 0.0–5.0)
HCT: 36.2 % (ref 36.0–46.0)
Hemoglobin: 12.5 g/dL (ref 12.0–15.0)
Lymphocytes Relative: 31.4 % (ref 12.0–46.0)
Lymphs Abs: 2.1 10*3/uL (ref 0.7–4.0)
MCHC: 34.5 g/dL (ref 30.0–36.0)
MCV: 87 fl (ref 78.0–100.0)
Monocytes Absolute: 0.6 10*3/uL (ref 0.1–1.0)
Monocytes Relative: 9.4 % (ref 3.0–12.0)
Neutro Abs: 3.8 10*3/uL (ref 1.4–7.7)
Neutrophils Relative %: 56.4 % (ref 43.0–77.0)
Platelets: 295 10*3/uL (ref 150.0–400.0)
RBC: 4.16 Mil/uL (ref 3.87–5.11)
RDW: 13.1 % (ref 11.5–15.5)
WBC: 6.8 10*3/uL (ref 4.0–10.5)

## 2021-05-31 LAB — C-REACTIVE PROTEIN: CRP: <1 mg/dL (ref 0.5–20.0)

## 2021-05-31 LAB — LIPID PANEL
Cholesterol: 214 mg/dL — ABNORMAL HIGH (ref 0–200)
HDL: 43 mg/dL (ref >39.00)
NonHDL: 171.13
Total CHOL/HDL Ratio: 5
Triglycerides: 334 mg/dL — ABNORMAL HIGH (ref 0.0–149.0)
VLDL: 66.8 mg/dL — ABNORMAL HIGH (ref 0.0–40.0)

## 2021-05-31 LAB — LDL CHOLESTEROL, DIRECT: Direct LDL: 128 mg/dL

## 2021-05-31 LAB — TSH: TSH: 1.59 u[IU]/mL (ref 0.35–5.50)

## 2021-05-31 LAB — SEDIMENTATION RATE: Sed Rate: 19 mm/hr (ref 0–30)

## 2021-05-31 LAB — HEMOGLOBIN A1C: Hgb A1c MFr Bld: 5.5 % (ref 4.6–6.5)

## 2021-06-03 LAB — ANA: Anti Nuclear Antibody (ANA): POSITIVE — AB

## 2021-06-03 LAB — RHEUMATOID FACTOR: Rheumatoid fact SerPl-aCnc: <14 IU/mL (ref <14)

## 2021-06-03 LAB — ANTI-NUCLEAR AB-TITER (ANA TITER)
ANA TITER: 1:320 {titer} — ABNORMAL HIGH
ANA Titer 1: 1:80 {titer} — ABNORMAL HIGH

## 2021-06-03 LAB — CYCLIC CITRUL PEPTIDE ANTIBODY, IGG: Cyclic Citrullin Peptide Ab: <16 UNITS

## 2021-06-10 ENCOUNTER — Other Ambulatory Visit: Payer: Self-pay | Admitting: Internal Medicine

## 2021-06-15 ENCOUNTER — Ambulatory Visit (HOSPITAL_COMMUNITY)
Admission: EM | Admit: 2021-06-15 | Discharge: 2021-06-15 | Disposition: A | Discharge status: Home / Self Care | Payer: Medicare HMO

## 2021-06-15 ENCOUNTER — Encounter (HOSPITAL_COMMUNITY): Payer: Self-pay | Admitting: *Deleted

## 2021-06-15 ENCOUNTER — Other Ambulatory Visit: Payer: Self-pay

## 2021-06-15 DIAGNOSIS — U071 COVID-19: Secondary | ICD-10-CM

## 2021-06-15 MED ORDER — NIRMATRELVIR/RITONAVIR (PAXLOVID)TABLET: 3.0000 | ORAL_TABLET | Freq: Two times a day (BID) | ORAL | 0 refills | Status: AC

## 2021-06-15 NOTE — ED Provider Notes (Signed)
[facility]    CSN: [id number] Arrival date & time: 06/15/21  1721      History   Chief Complaint Chief Complaint  Patient presents with   Covid +   Chills   Cough    HPI Pam Butler is a 75 y.o. female.   Patient here for evaluation of fever, cough, sore throat, fatigue, and body aches that have been ongoing for the past few days.  Reports feeling fatigued on Wednesday and Thursday and developed a fever last night.  Reports husband recently tested positive for COVID.  Reports taking a home test today that was positive.  Reports taking Tylenol with some symptom relief.  Denies any trauma, injury, or other precipitating event.  Denies any specific alleviating or aggravating factors.  Denies any chest pain, shortness of breath, N/V/D, numbness, tingling, weakness, abdominal pain, or headaches.    The history is provided by the patient.  Cough Associated symptoms: chills, fever and sore throat   Associated symptoms: no chest pain, no headaches and no shortness of breath    Past Medical History:  Diagnosis Date   Allergy    Anxiety    Cataract    Bil   Depression    History of bloody stools    in past/   History of muscle pain    Bil pain in bil arms and shoulders/ chronic since last year/left shoulder started this year   Loose stools    on and off/occasional blood in stool    Patient Active Problem List   Diagnosis Date Noted   Polyarthralgia 05/27/2021   Hypercalcemia 08/14/2020   Positive ANA (antinuclear antibody) 08/14/2020   Vitamin D deficiency 05/15/2020   CRP elevated 05/15/2020   Bilateral shoulder pain 03/22/2020   Left wrist tendinitis 03/22/2020   Urinary incontinence 02/21/2020   Allergic rhinitis 02/21/2020   Thrombophlebitis 04/11/2019   Chest pressure 02/14/2019   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      Home Medications    Prior to Admission medications   Medication Sig Start Date End Date Taking? Authorizing Provider  Acetaminophen (TYLENOL EXTRA STRENGTH PO) Take by mouth. Take one prn   Yes [provider]  calcium carbonate (CALCIUM 600) 600 MG TABS tablet Take 1 tablet (600 mg total) by mouth daily with breakfast. 05/15/20  Yes Burns, Claudina Lick, MD  cetirizine (ZYRTEC) 10 MG tablet Take 1 tablet (10 mg total) by mouth daily. 11/10/20  Yes [name], MD  Cholecalciferol (VITAMIN D) 50 MCG (2000 UT) CAPS 1 cap daily 05/15/20  Yes Burns, Claudina Lick, MD  citalopram (CELEXA) 20 MG tablet TAKE 1 TABLET BY MOUTH EVERY DAY 06/11/21  Yes Burns, Claudina Lick, MD  COLLAGEN PO Take by mouth.   Yes [provider]  ezetimibe (ZETIA) 10 MG tablet Take 1 tablet (10 mg total) by mouth daily. 05/27/21  Yes Burns, Claudina Lick, MD  Melatonin 10  MG CAPS Take by mouth as needed.   Yes [provider]  Multiple Vitamins-Minerals (WOMENS MULTI VITAMIN & MINERAL PO) Take by mouth. Centrum Womens MVI-Take one daily   Yes [provider]  nirmatrelvir/ritonavir EUA (PAXLOVID) 20 x 150 MG & 10 x '100MG'$  TABS Take 3 tablets by mouth 2 (two) times daily for 5 days. Patient GFR is >60. Take nirmatrelvir (150 mg) two tablets twice daily for 5 days and ritonavir (100 mg) one tablet twice daily for 5 days. 06/15/21 06/20/21 Yes Pearson Forster, NP  TURMERIC PO Take by mouth.   Yes [provider]  aspirin EC 325 MG tablet Take 325 mg by mouth as needed.    [provider]    Family History Family History  Problem Relation Age of Onset   Other  Mother        Prediabetes   Colitis Mother    Pneumonia Father    Arthritis Sister    Heart attack Paternal Grandmother 23   Heart attack Maternal Uncle 66   Colon cancer Neg Hx     Social History Social History   Tobacco Use   Smoking status: Never   Smokeless tobacco: Never  Vaping Use   Vaping Use: Never used  Substance Use Topics   Alcohol use: No    Alcohol/week: 0.0 standard drinks   Drug use: No     Allergies   Latex, Penicillins, Ciprofloxacin, and Codeine   Review of Systems Review of Systems  Constitutional:  Positive for chills, fatigue and fever.  HENT:  Positive for congestion and sore throat.   Respiratory:  Positive for cough. Negative for shortness of breath.   Cardiovascular:  Negative for chest pain.  Neurological:  Negative for headaches.  All other systems reviewed and are negative.   Physical Exam Triage Vital Signs ED Triage Vitals  Enc Vitals Group     BP 06/15/21 1807 134/73     Pulse Rate 06/15/21 1807 89     Resp 06/15/21 1807 (!) 26     Temp 06/15/21 1807 99.9 F (37.7 C)     Temp Source 06/15/21 1807 Oral     SpO2 06/15/21 1807 100 %     Weight --      Height --      Head Circumference --      Peak Flow --      Pain Score 06/15/21 1809 6     Pain Loc --      Pain Edu? --      Excl. in [place]? --    No data found.  Updated Vital Signs BP 134/73   Pulse 89   Temp 99.9 F (37.7 C) (Oral)   Resp (!) 22   SpO2 100%   Visual Acuity Right Eye Distance:   Left Eye Distance:   Bilateral Distance:    Right Eye Near:   Left Eye Near:    Bilateral Near:     Physical Exam Vitals and nursing note reviewed.  Constitutional:      General: She is not in acute distress.    Appearance: Normal appearance. She is not ill-appearing, toxic-appearing or diaphoretic.  HENT:     Head: Normocephalic and atraumatic.     Nose: Congestion present.     Mouth/Throat:     Mouth: Mucous membranes are moist.     Pharynx: Posterior  oropharyngeal erythema present. No oropharyngeal exudate.  Eyes:     Conjunctiva/sclera: Conjunctivae normal.  Cardiovascular:  Rate and Rhythm: Normal rate and regular rhythm.     Pulses: Normal pulses.     Heart sounds: Normal heart sounds.  Pulmonary:     Effort: Pulmonary effort is normal.     Breath sounds: Normal breath sounds.  Abdominal:     General: Abdomen is flat.  Musculoskeletal:        General: Normal range of motion.     Cervical back: Normal range of motion.  Skin:    General: Skin is warm and dry.  Neurological:     General: No focal deficit present.     Mental Status: She is alert and oriented to person, place, and time.  Psychiatric:        Mood and Affect: Mood normal.     UC Treatments / Results  Labs (all labs ordered are listed, but only abnormal results are displayed) Labs Reviewed  SARS CORONAVIRUS 2 (TAT 6-24 HRS)    EKG   Radiology No results found.  Procedures Procedures (including critical care time)  Medications Ordered in UC Medications - No data to display  Initial Impression / Assessment and Plan / UC Course  I have reviewed the triage vital signs and the nursing notes.  Pertinent labs & imaging results that were available during my care of the patient were reviewed by me and considered in my medical decision making (see chart for details).    Assessment negative for red flags or concerns.  As patient had a positive home test and a known exposure this is likely COVID and does not require retesting at this time.  Based on patient's age she is a candidate for antiviral therapy.  Lab work in August had normal kidney function with a GFR greater than 60 and medications are not contraindicated with Paxlovid.  Take the paxlovid twice a day for the next 5 days.  Continue Tylenol and/or ibuprofen as needed.  Discussed conservative symptom management as described in discharge instructions.  Encourage fluids and rest.  Follow-up with primary  care for reevaluation Final Clinical Impressions(s) / UC Diagnoses   Final diagnoses:  COVID-19     Discharge Instructions      Take the paxlovid twice a day for the next 5 days.   You can take Tylenol and/or Ibuprofen as needed for fever reduction and pain relief.   For cough: honey 1/2 to 1 teaspoon (you can dilute the honey in water or another fluid).  You can also use guaifenesin and dextromethorphan for cough. You can use a humidifier for chest congestion and cough.  If you don't have a humidifier, you can sit in the bathroom with the hot shower running.     For sore throat: try warm salt water gargles, cepacol lozenges, throat spray, warm tea or water with lemon/honey, popsicles or ice, or OTC cold relief medicine for throat discomfort.    For congestion: take a daily anti-histamine like Zyrtec, Claritin, and a oral decongestant, such as pseudoephedrine.  You can also use Flonase 1-2 sprays in each nostril daily.    It is important to stay hydrated: drink plenty of fluids (water, gatorade/powerade/pedialyte, juices, or teas) to keep your throat moisturized and help further relieve irritation/discomfort.   Return or go to the Emergency Department if symptoms worsen or do not improve in the next few days.      ED Prescriptions     Medication Sig Dispense Auth. Provider   nirmatrelvir/ritonavir EUA (PAXLOVID) 20 x 150 MG & 10 x '100MG'$  TABS  Take 3 tablets by mouth 2 (two) times daily for 5 days. Patient GFR is >60. Take nirmatrelvir (150 mg) two tablets twice daily for 5 days and ritonavir (100 mg) one tablet twice daily for 5 days. 30 tablet Pearson Forster, NP      PDMP not reviewed this encounter.   Pearson Forster, NP 06/15/21 [id number]

## 2021-06-15 NOTE — Discharge Instructions (Addendum)
Take the paxlovid twice a day for the next 5 days.   You can take Tylenol and/or Ibuprofen as needed for fever reduction and pain relief.   For cough: honey 1/2 to 1 teaspoon (you can dilute the honey in water or another fluid).  You can also use guaifenesin and dextromethorphan for cough. You can use a humidifier for chest congestion and cough.  If you don't have a humidifier, you can sit in the bathroom with the hot shower running.     For sore throat: try warm salt water gargles, cepacol lozenges, throat spray, warm tea or water with lemon/honey, popsicles or ice, or OTC cold relief medicine for throat discomfort.    For congestion: take a daily anti-histamine like Zyrtec, Claritin, and a oral decongestant, such as pseudoephedrine.  You can also use Flonase 1-2 sprays in each nostril daily.    It is important to stay hydrated: drink plenty of fluids (water, gatorade/powerade/pedialyte, juices, or teas) to keep your throat moisturized and help further relieve irritation/discomfort.   Return or go to the Emergency Department if symptoms worsen or do not improve in the next few days.

## 2021-06-15 NOTE — ED Triage Notes (Signed)
Chills, cough, sore throat, "rumbly stomach", chest pain with coughing, fatigue, temp up to 99.2 at home onset approx 3 days.  States spouse is positive this week, pt had positive home Covid test this AM.

## 2021-06-15 NOTE — ED Notes (Signed)
Pt declined COVID Test. . Pt reports a Positive home COVID and Husband is positive  For COVID.

## 2021-06-19 ENCOUNTER — Ambulatory Visit: Payer: Medicare HMO

## 2021-06-19 ENCOUNTER — Telehealth: Payer: Self-pay | Admitting: Internal Medicine

## 2021-06-19 NOTE — Telephone Encounter (Signed)
[facility] FYI  Caller states she just tested positive for covid this morning. Symptoms started sometime during the week. She has fatigue and weak, fever 99.2, sore throat, chills, her stomach is rumbling and her chest feels sore. Denies difficulty breathing.   Advised to be seen within 4 hrs. Patient understood but did not know what to do.   Consult note is scanned into media tab.

## 2021-06-19 NOTE — Telephone Encounter (Signed)
Spoke with patient today. She was seen on 9/3 and prescribed anti-viral meds.  As of today she is slowing starting to feel better. Patient encouraged to rest and push fluids. She has been told to call and follow up if she starts to feel worse again.

## 2021-07-11 NOTE — Progress Notes (Signed)
Office Visit Note  Patient: Pam Butler             Date of Birth: [birth year]           MRN: [id number]             PCP: [name], MD Referring: [name], MD Visit Date: 07/12/2021  Subjective:  New Patient (Initial Visit) (Abnormal labs, left wrist pain - Dr. Amedeo Plenty will be performing surgery, right wrist pain, cannot close hands completely, sharp pain with movement, bil knee pain, difficulty walking, bil shoulder pain)   History of Present Illness: Pam Butler is a 75 y.o. female here for joint pains and positive ANA.  These primary symptoms are ongoing for the past year in particular she has noticed worsening since the start of this year.  Pain and swelling in her wrist with decreased grip strength and range of motion primarily affecting the left hand also with lesser milder symptoms on the right side.  There was no particular injury or event associated with the symptoms that she can recall.  She has had fairly extensive work-up seeing Dr. Tamala Julian and Dr. Amedeo Plenty with limited response to conservative treatment so far and imaging showing tearing of TFCC, and scapholunate, and ECU tendon involvement, as well as a moderate osteoarthritis.  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  Her symptoms and decreased strength have contributed she transferred her mother to a nursing facility several months ago where she is now on palliative/end-of-life care. Some amount of joint pain is very chronic for her with known osteoarthritis.  She had a previous positive testing for lupus many years ago was told it was thought to be false positive due to no organ invovlement found when worked up more extensively.   Labs reviewed 05/2021 ANA 1:320 homogenous 1:80 speckled RF neg CCP neg ESR 19 CRP <1 CBC wnl CMP wnl TSH 1.59 Vit D 35.51  Imaging reviewed 01/24/21 MRI Left wrist w/ contrast IMPRESSION: Tear of the disc of the  triangular fibrocartilage. Tears of the intraosseous and volar bands of both the scapholunate and lunotriquetral ligaments. 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 The ECU subsheath is intact. Mild osteoarthritis about the wrist most notable at the first [facility] joint.  03/21/20 Xray right shoulder IMPRESSION: 1. No fracture or bone lesion. 2. Mild AC joint osteoarthritis.  03/21/20 Xray left knee IMPRESSION: No fracture or dislocation of the left knee. Mild tricompartmental arthrosis.  Activities of Daily Living:  Patient reports morning stiffness for 24 hours.   Patient Reports nocturnal pain.  Difficulty dressing/grooming: Reports Difficulty climbing stairs: Reports Difficulty getting out of chair: Reports Difficulty using hands for taps, buttons, cutlery, and/or writing: Reports  Review of Systems  Constitutional:  Positive for fatigue.  HENT:  Positive for mouth dryness.   Eyes:  Positive for dryness.  Respiratory:  Positive for shortness of breath.   Cardiovascular:  Positive for swelling in legs/feet.  Gastrointestinal:  Positive for constipation and diarrhea.  Endocrine: Positive for excessive thirst and increased urination.  Genitourinary:  Positive for involuntary urination.  Musculoskeletal:  Positive for joint pain, gait problem, joint pain, joint swelling, muscle weakness, morning stiffness and muscle tenderness.  Skin:  Negative for rash.  Allergic/Immunologic: Negative for susceptible to infections.  Neurological:  Positive for numbness and weakness.  Hematological:  Negative for bruising/bleeding tendency.  Psychiatric/Behavioral:  Positive for sleep disturbance.    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   Obesity (BMI 30.0-34.9) 07/13/2018   Osteopenia 02/18/2018   Prediabetes 11/12/2016   Anxiety 03/08/2010   IBS 03/08/2010   Hyperlipidemia 05/05/2008   Depression 05/05/2008   GERD 05/05/2008    Past Medical History:  Diagnosis Date   Allergy    Anxiety    Cataract    Bil   Depression    History of bloody stools    in past/   History of muscle pain    Bil pain in bil arms and shoulders/ chronic since last year/left shoulder started this year   Loose stools    on and off/occasional blood in stool    Family History  Problem Relation Age of Onset   Other Mother        Prediabetes   Colitis Mother    Pneumonia Father    Arthritis Sister    Heart attack Paternal Grandmother 12   Heart attack Maternal Uncle 42   Colon cancer Neg Hx    Past Surgical History:  Procedure Laterality Date   ABDOMINAL HYSTERECTOMY     with bladder tack   APPENDECTOMY     BREAST BIOPSY Right 2001   benign   CHOLECYSTECTOMY     COLONOSCOPY     TUBAL LIGATION     Social History   Social History Narrative   Exercise: none   Immunization History  Administered Date(s) Administered   Fluad Quad(high Dose 65+) 08/23/2019   Influenza Whole 07/06/2008, 07/19/2010   Influenza, High Dose Seasonal PF 07/13/2018   Influenza,inj,Quad PF,6+ Mos 08/09/2016   PFIZER(Purple Top)SARS-COV-2 Vaccination 12/12/2019, 01/11/2020   Pneumococcal Conjugate-13 11/12/2016   Pneumococcal Polysaccharide-23 01/11/2018   Td 06/30/2005     Objective: Vital Signs: BP 132/75 (BP Location: Right Arm, Patient Position: Sitting, Cuff Size: Normal)   Pulse 71   Resp 18   Ht 5' 1.5" (1.562 m)   Wt 179 lb (81.2 kg)   BMI 33.27 kg/m    Physical Exam Constitutional:      Appearance: She is obese.  HENT:      Right Ear: External ear normal.     Mouth/Throat:     Mouth: Mucous membranes are moist.     Pharynx: Oropharynx is clear.  Cardiovascular:     Rate and Rhythm: Normal rate and regular rhythm.  Pulmonary:     Effort: Pulmonary effort is normal.     Breath sounds: Normal breath sounds.  Skin:    General: Skin is warm and dry.  Neurological:     General: No focal deficit present.     Mental Status: She is alert.  Psychiatric:        Mood and Affect: Mood normal.     Musculoskeletal Exam:  Left wrist swelling with mild tenderness to palpation and decreased range of motion Decreased flexion ROM all left fingers, with some palpable rubbing of flexor tendons, Rght hand and wrist normal range of motion Left shoulder decreased abduction ROM and guarding, right shoulder normal range of motion Bilateral  knees medial tenderness to pressure also posterior tenderness   Investigation: No additional findings.  Imaging: XR Hand 2 View Right  Result Date: 07/12/2021 X-ray right hand 2 views Radiocarpal and carpal joint spaces appear normal.  MCP joint spaces appear normal.  Interphalangeal joint spaces are well-preserved with very small lateral osteophyte formation at DIPs.  Bone mineralization appears normal. Impression No significant arthritis changes seen   Recent Labs: Lab Results  Component Value Date   WBC 6.8 05/31/2021   HGB 12.5 05/31/2021   PLT 295.0 05/31/2021   NA 137 05/31/2021   K 3.9 05/31/2021   CL 105 05/31/2021   CO2 21 05/31/2021   GLUCOSE 83 05/31/2021   BUN 21 05/31/2021   CREATININE 0.89 05/31/2021   BILITOT 0.4 05/31/2021   ALKPHOS 61 05/31/2021   AST 18 05/31/2021   ALT 17 05/31/2021   PROT 7.4 05/31/2021   ALBUMIN 4.3 05/31/2021   CALCIUM 9.7 05/31/2021   GFRAA 72 08/09/2016    Speciality Comments: No specialty comments available.  Procedures:  No procedures performed Allergies: Latex, Penicillins, Ciprofloxacin, and Codeine   Assessment /  Plan:     Visit Diagnoses: Positive ANA (antinuclear antibody) - Plan: RNP Antibody, Anti-Smith antibody, Sjogrens syndrome-A extractable nuclear antibody, Anti-DNA antibody, double-stranded, C3 and C4, Sedimentation rate, XR Hand 2 View Right  Positive ANA some history of questionable concern of lupus in the past but cannot confirm any other clinical criteria at this time.  We will check specific antibody panels including RNP, Smith, SSA, dsDNA, and serum complements.  Checking sedimentation rate.  Check x-ray of the right hand today no specific inflammatory changes were noted.  Fracture tears and tendon tearing is very atypical lupus presentation, can be seen in some other systemic connective tissue diseases but no clinical criteria noted at this time will recommend follow-up if highly positive serology though.  Left wrist tendinitis  Multiple structural changes on MRI in the left wrist is not very clear how much of the current swelling is from a separate inflammatory process or underlying issues. Agree with continued management with hand surgery likely needed to restore function given lack of improvement so far to conservative treatment.  Chronic pain of both shoulders  Shoulder problems are more suggestive for rotator cuff arthropathy or may be some degenerative arthritis is present.  Currently left side more symptomatic probably related to abnormal mechanics with the severe left wrist problems.  Orders: Orders Placed This Encounter  Procedures   XR Hand 2 View Right   RNP Antibody   Anti-Smith antibody   Sjogrens syndrome-A extractable nuclear antibody   Anti-DNA antibody, double-stranded   C3 and C4   Sedimentation rate    No orders of the defined types were placed in this encounter.    Follow-Up Instructions: Return in about 2 weeks (around 07/26/2021) for New pt f/u +ANA/arthritis f/u 2 wks.   Collier Salina, MD  Note - This record has been created using [employer].   Chart creation errors have been sought, but may not always  have been located. Such creation errors do not reflect on  the standard of medical care.

## 2021-07-12 ENCOUNTER — Encounter: Payer: Self-pay | Admitting: Internal Medicine

## 2021-07-12 ENCOUNTER — Other Ambulatory Visit: Payer: Self-pay

## 2021-07-12 ENCOUNTER — Ambulatory Visit: Payer: Medicare HMO | Admitting: Internal Medicine

## 2021-07-12 ENCOUNTER — Ambulatory Visit: Payer: Self-pay

## 2021-07-12 VITALS — BP 132/75 | HR 71 | Resp 18 | Ht 61.5 in | Wt 179.0 lb

## 2021-07-12 DIAGNOSIS — M25512 Pain in left shoulder: Secondary | ICD-10-CM | POA: Diagnosis not present

## 2021-07-12 DIAGNOSIS — G8929 Other chronic pain: Secondary | ICD-10-CM | POA: Diagnosis not present

## 2021-07-12 DIAGNOSIS — M25511 Pain in right shoulder: Secondary | ICD-10-CM | POA: Diagnosis not present

## 2021-07-12 DIAGNOSIS — M778 Other enthesopathies, not elsewhere classified: Secondary | ICD-10-CM

## 2021-07-12 DIAGNOSIS — M79641 Pain in right hand: Secondary | ICD-10-CM | POA: Diagnosis not present

## 2021-07-12 DIAGNOSIS — R768 Other specified abnormal immunological findings in serum: Secondary | ICD-10-CM | POA: Diagnosis not present

## 2021-07-15 LAB — RNP ANTIBODY: Ribonucleic Protein(ENA) Antibody, IgG: <1 AI

## 2021-07-15 LAB — ANTI-DNA ANTIBODY, DOUBLE-STRANDED: ds DNA Ab: <1 IU/mL

## 2021-07-15 LAB — SEDIMENTATION RATE: Sed Rate: 25 mm/h (ref 0–30)

## 2021-07-15 LAB — C3 AND C4
C3 Complement: 153 mg/dL (ref 83–193)
C4 Complement: 31 mg/dL (ref 15–57)

## 2021-07-15 LAB — SJOGRENS SYNDROME-A EXTRACTABLE NUCLEAR ANTIBODY: SSA (Ro) (ENA) Antibody, IgG: <1 AI

## 2021-07-15 LAB — ANTI-SMITH ANTIBODY: ENA SM Ab Ser-aCnc: <1 AI

## 2021-07-29 NOTE — Progress Notes (Signed)
Office Visit Note  Patient: Pam Butler             Date of Birth: Feb 21, 1946           MRN: [id number]             PCP: [name], MD Referring: [name], MD Visit Date: 07/30/2021   Subjective:  Other (Patient reports pain and swelling in bilateral hands and wrists. Patient also reports bilateral knee pain, left knee is worse, bilateral ankle pain. )   History of Present Illness: Pam Butler is a 75 y.o. female here for follow up with joint pain in multiple sites especially in left wrist but also joint pain in multiple sites. She has a past questionable history of lupus, laboratory workup at initial visit negative for specific inflammatory markers or inflammatory markers. She continues having joint pains in multiple areas most problematic are her hands and wrists. Her mother passed away 2 weeks ago for whom she had been primary caregiver most of the past 6.5 years before moving her to nursing facility earlier this year.  Previous HPI 07/12/21 Pam Butler is a 75 y.o. female here for joint pains and positive ANA.  These primary symptoms are ongoing for the past year in particular she has noticed worsening since the start of this year.  Pain and swelling in her wrist with decreased grip strength and range of motion primarily affecting the left hand also with lesser milder symptoms on the right side.  There was no particular injury or event associated with the symptoms that she can recall.  She has had fairly extensive work-up seeing Dr. Tamala Julian and Dr. Amedeo Plenty with limited response to conservative treatment so far and imaging showing tearing of TFCC, and scapholunate, and ECU tendon involvement, as well as a moderate osteoarthritis.  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  Her symptoms and decreased strength have contributed she transferred her mother to a nursing facility several months ago where she is now on  palliative/end-of-life care. Some amount of joint pain is very chronic for her with known osteoarthritis.  She had a previous positive testing for lupus many years ago was told it was thought to be false positive due to no organ invovlement found when worked up more extensively.    Labs reviewed 05/2021 ANA 1:320 homogenous 1:80 speckled RF neg CCP neg ESR 19 CRP <1 CBC wnl CMP wnl TSH 1.59 Vit D 35.51   Review of Systems  Constitutional:  Positive for fatigue.  HENT:  Positive for mouth dryness. Negative for mouth sores and nose dryness.   Eyes:  Positive for dryness. Negative for pain and itching.  Respiratory:  Positive for shortness of breath. Negative for difficulty breathing.   Cardiovascular:  Negative for chest pain and palpitations.  Gastrointestinal:  Negative for blood in stool, constipation and diarrhea.  Endocrine: Negative for increased urination.  Genitourinary:  Negative for difficulty urinating.  Musculoskeletal:  Positive for joint pain, joint pain, joint swelling, myalgias, morning stiffness and myalgias. Negative for muscle tenderness.  Skin:  Negative for color change, rash and redness.  Allergic/Immunologic: Negative for susceptible to infections.  Neurological:  Positive for parasthesias. Negative for dizziness, numbness, headaches and memory loss.  Hematological:  Negative for bruising/bleeding tendency.  Psychiatric/Behavioral:  Negative for confusion.    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   Obesity (BMI 30.0-34.9) 07/13/2018    Osteopenia 02/18/2018   Prediabetes 11/12/2016   Anxiety 03/08/2010   IBS 03/08/2010   Hyperlipidemia 05/05/2008   Depression 05/05/2008   GERD 05/05/2008    Past Medical History:  Diagnosis Date   Allergy    Anxiety    Cataract    Bil   Depression    History of bloody stools    in past/   History of muscle pain    Bil pain in bil arms and shoulders/ chronic since last year/left shoulder started this year   Loose stools    on and off/occasional blood in stool    Family History  Problem Relation Age of Onset   Other Mother        Prediabetes   Colitis Mother    Pneumonia Father    Arthritis Sister    Heart attack Maternal Uncle 41   Heart attack Paternal Grandmother 14   Colon cancer Neg Hx    Past Surgical History:  Procedure Laterality Date   ABDOMINAL HYSTERECTOMY     with bladder tack   APPENDECTOMY     BREAST BIOPSY Right 2001   benign   CHOLECYSTECTOMY     COLONOSCOPY     TUBAL LIGATION     Social History   Social History Narrative   Exercise: none   Immunization History  Administered Date(s) Administered   Fluad Quad(high Dose 65+) 08/23/2019   Influenza Whole 07/06/2008, 07/19/2010   Influenza, High Dose Seasonal PF 07/13/2018   Influenza,inj,Quad PF,6+ Mos 08/09/2016   PFIZER(Purple Top)SARS-COV-2 Vaccination 12/12/2019, 01/11/2020   Pneumococcal Conjugate-13 11/12/2016   Pneumococcal Polysaccharide-23 01/11/2018   Td 06/30/2005     Objective: Vital Signs: BP 125/74 (BP Location: Left Arm, Patient Position: Sitting, Cuff Size: Large)   Pulse 75   Ht 5' 1.5" (1.562 m)   Wt 181 lb 6.4 oz (82.3 kg)   BMI 33.72 kg/m    Physical Exam Constitutional:      Appearance: She is obese.  Skin:    General: Skin is warm and dry.     Findings: No rash.  Neurological:     Mental Status: She is alert.  Psychiatric:        Mood and Affect: Mood normal.     Musculoskeletal Exam:  Elbows full ROM no tenderness or swelling Left wrist pain with  flexion and extension, chronic swelling over ulnar side, ultrasound inspection with partial thickness tendon tear no obvious effusion and no color doppler enhancement Left 2nd and 3rd MCPs tenderness and friction rub with ROM, no palpable synovitis Ankles full ROM, tenderness, no palpable joint effusion, overlying ankle edema or lymphedema  Investigation: No additional findings.  Imaging: XR Hand 2 View Right  Result Date: 07/12/2021 X-ray right hand 2 views Radiocarpal and carpal joint spaces appear normal.  MCP joint spaces appear normal.  Interphalangeal joint spaces are well-preserved with very small lateral osteophyte formation at DIPs.  Bone mineralization appears normal. Impression No significant arthritis changes seen   Recent Labs: Lab Results  Component Value Date   WBC 6.8 05/31/2021   HGB 12.5 05/31/2021  PLT 295.0 05/31/2021   NA 137 05/31/2021   K 3.9 05/31/2021   CL 105 05/31/2021   CO2 21 05/31/2021   GLUCOSE 83 05/31/2021   BUN 21 05/31/2021   CREATININE 0.89 05/31/2021   BILITOT 0.4 05/31/2021   ALKPHOS 61 05/31/2021   AST 18 05/31/2021   ALT 17 05/31/2021   PROT 7.4 05/31/2021   ALBUMIN 4.3 05/31/2021   CALCIUM 9.7 05/31/2021   GFRAA 72 08/09/2016    Speciality Comments: No specialty comments available.  Procedures:  No procedures performed Allergies: Latex, Penicillins, Ciprofloxacin, and Codeine   Assessment / Plan:     Visit Diagnoses: Polyarthralgia  Positive ANA but negative specific antibody tests and normal inflammatory markers and no particular synovitis on exam or ultrasound inspection today. Based on this most symptoms seem more from osteoarthritis or some overuse type injuries. I recommended symptomatic management for these and discussed multiple conservative treatments. If not improving adequately would benefit with f/u to Dr. Amedeo Plenty for any additional procedure needed.  Orders: No orders of the defined types were placed in this  encounter.  No orders of the defined types were placed in this encounter.    Follow-Up Instructions: No follow-ups on file.   Collier Salina, MD  Note - This record has been created using [employer].  Chart creation errors have been sought, but may not always  have been located. Such creation errors do not reflect on  the standard of medical care.

## 2021-07-30 ENCOUNTER — Ambulatory Visit: Payer: Medicare HMO | Admitting: Internal Medicine

## 2021-07-30 ENCOUNTER — Other Ambulatory Visit: Payer: Self-pay

## 2021-07-30 ENCOUNTER — Encounter: Payer: Self-pay | Admitting: Internal Medicine

## 2021-07-30 VITALS — BP 125/74 | HR 75 | Ht 61.5 in | Wt 181.4 lb

## 2021-07-30 DIAGNOSIS — M2550 Pain in unspecified joint: Secondary | ICD-10-CM | POA: Diagnosis not present

## 2021-07-30 NOTE — Patient Instructions (Addendum)
For osteoarthritis several treatments may be beneficial:  - Oral nonsteroidal antiinflammatory medication such as ibuprofen, aleve, celebrex, or mobic are very helpful for osteoarthritis but can cause side effects such as stomach ulcers or hypertension with prolonged use. These should be taken intermittently or as needed, and always taken with food.  - Topical antiinflammatory medicine such as diclofenac or Voltaren can be applied to  affected area as needed but may be less effective than oral antiinflammatory medicine. Topical analgesics containing CBD, menthol, or lidocaine can be tried. Capsaicin containing treatments should not be used for the hand.  - Other oral supplements such as glucosamine chondroitin containing OTC treatments such as osteo bi-flex or other brands do not have strong data supporting effectiveness but can be helpful for some individuals and have no major side effects.  - Turmeric has some antiinflammatory effect similar to NSAID medications and may help, if taken as a supplement should not be taken above recommended doses. Usually not more than 500 mg of curcumin max per day for long term safety, the active ingredient in turmeric for inflammation.  - Compressive gloves can be helpful to support the thumb joint, especially if hurting with certain activities and most benefit seen wearing 8+ hours per day.  - Occupational therapy referral can discuss exercises or activity modification to improve symptoms or strength if needed.  - Local steroid injection is an option if symptoms become worse and not controlled by the above options.   Osteoarthritis Osteoarthritis is a type of arthritis. It refers to joint pain or joint disease. Osteoarthritis affects tissue that covers the ends of bones in joints (cartilage). Cartilage acts as a cushion between the bones and helps them move smoothly. Osteoarthritis occurs when cartilage in the joints gets worn down. Osteoarthritis is sometimes  called "wear and tear" arthritis. Osteoarthritis is the most common form of arthritis. It often occurs in older people. It is a condition that gets worse over time. The joints most often affected by this condition are in the fingers, toes, hips, knees, and spine, including the neck and lower back. What are the causes? This condition is caused by the wearing down of cartilage that covers the ends of bones. What increases the risk? The following factors may make you more likely to develop this condition: Being age 76 or older. Obesity. Overuse of joints. Past injury of a joint. Past surgery on a joint. Family history of osteoarthritis. What are the signs or symptoms? The main symptoms of this condition are pain, swelling, and stiffness in the joint. Other symptoms may include: An enlarged joint. More pain and further damage caused by small pieces of bone or cartilage that break off and float inside of the joint. Small deposits of bone (osteophytes) that grow on the edges of the joint. A grating or scraping feeling inside the joint when you move it. Popping or creaking sounds when you move. Difficulty walking or exercising. An inability to grip items, twist your hand(s), or control the movements of your hands and fingers. How is this diagnosed? This condition may be diagnosed based on: Your medical history. A physical exam. Your symptoms. X-rays of the affected joint(s). Blood tests to rule out other types of arthritis. How is this treated? There is no cure for this condition, but treatment can help control pain and improve joint function. Treatment may include a combination of therapies, such as: Pain relief techniques, such as: Applying heat and cold to the joint. Massage. A form of talk therapy  called cognitive behavioral therapy (CBT). This therapy helps you set goals and follow up on the changes that you make. Medicines for pain and inflammation. The medicines can be taken by  mouth or applied to the skin. They include: NSAIDs, such as ibuprofen. Prescription medicines. Strong anti-inflammatory medicines (corticosteroids). Certain nutritional supplements. A prescribed exercise program. You may work with a physical therapist. Assistive devices, such as a brace, wrap, splint, specialized glove, or cane. A weight control plan. Surgery, such as: An osteotomy. This is done to reposition the bones and relieve pain or to remove loose pieces of bone and cartilage. Joint replacement surgery. You may need this surgery if you have advanced osteoarthritis. Follow these instructions at home: Activity Rest your affected joints as told by your health care provider. Exercise as told by your health care provider. He or she may recommend specific types of exercise, such as: Strengthening exercises. These are done to strengthen the muscles that support joints affected by arthritis. Aerobic activities. These are exercises, such as brisk walking or water aerobics, that increase your heart rate. Range-of-motion activities. These help your joints move more easily. Balance and agility exercises. Managing pain, stiffness, and swelling   If directed, apply heat to the affected area as often as told by your health care provider. Use the heat source that your health care provider recommends, such as a moist heat pack or a heating pad. If you have a removable assistive device, remove it as told by your health care provider. Place a towel between your skin and the heat source. If your health care provider tells you to keep the assistive device on while you apply heat, place a towel between the assistive device and the heat source. Leave the heat on for 20-30 minutes. Remove the heat if your skin turns bright red. This is especially important if you are unable to feel pain, heat, or cold. You may have a greater risk of getting burned. If directed, put ice on the affected area. To do this: If  you have a removable assistive device, remove it as told by your health care provider. Put ice in a plastic bag. Place a towel between your skin and the bag. If your health care provider tells you to keep the assistive device on during icing, place a towel between the assistive device and the bag. Leave the ice on for 20 minutes, 2-3 times a day. Move your fingers or toes often to reduce stiffness and swelling. Raise (elevate) the injured area above the level of your heart while you are sitting or lying down. General instructions Take over-the-counter and prescription medicines only as told by your health care provider. Maintain a healthy weight. Follow instructions from your health care provider for weight control. Do not use any products that contain nicotine or tobacco, such as cigarettes, e-cigarettes, and chewing tobacco. If you need help quitting, ask your health care provider. Use assistive devices as told by your health care provider. Keep all follow-up visits as told by your health care provider. This is important. Where to find more information [employer] of Arthritis and Musculoskeletal and Skin Diseases: www.niams.SouthExposed.es [employer] on Aging: http://kim-miller.com/ American College of Rheumatology: www.rheumatology.org Contact a health care provider if: You have redness, swelling, or a feeling of warmth in a joint that gets worse. You have a fever along with joint or muscle aches. You develop a rash. You have trouble doing your normal activities. Get help right away if: You have pain that gets  worse and is not relieved by pain medicine. Summary Osteoarthritis is a type of arthritis that affects tissue covering the ends of bones in joints (cartilage). This condition is caused by the wearing down of cartilage that covers the ends of bones. The main symptom of this condition is pain, swelling, and stiffness in the joint. There is no cure for this condition, but treatment  can help control pain and improve joint function.

## 2021-08-06 ENCOUNTER — Other Ambulatory Visit: Payer: Self-pay

## 2021-08-06 ENCOUNTER — Encounter: Payer: Self-pay | Admitting: Obstetrics and Gynecology

## 2021-08-06 ENCOUNTER — Ambulatory Visit: Payer: Medicare HMO | Admitting: Obstetrics and Gynecology

## 2021-08-06 VITALS — BP 130/69 | HR 83 | Ht 61.5 in | Wt 181.0 lb

## 2021-08-06 DIAGNOSIS — N393 Stress incontinence (female) (male): Secondary | ICD-10-CM

## 2021-08-06 DIAGNOSIS — R159 Full incontinence of feces: Secondary | ICD-10-CM

## 2021-08-06 DIAGNOSIS — N3281 Overactive bladder: Secondary | ICD-10-CM | POA: Diagnosis not present

## 2021-08-06 MED ORDER — MIRABEGRON ER 25 MG PO TB24: 25.0000 mg | ORAL_TABLET | Freq: Every day | ORAL | 5 refills | Status: DC

## 2021-08-06 NOTE — Progress Notes (Signed)
[facility] New Patient Evaluation and Consultation  Referring Provider: [name], MD PCP: [name], MD Date of Service: 08/06/2021  SUBJECTIVE Chief Complaint: New Patient (Initial Visit) (Referral for incontinence)  History of Present Illness: Pam Butler is a 75 y.o. White or Caucasian female seen in consultation at the request of Dr. Quay Burow for evaluation of incontinence.    Review of records from Dr Quay Burow significant for: History of bladder sling, now with recurrent leakage. Declined PT. Has occasional fecal incontinence.   Urinary Symptoms: Leaks urine with cough/ sneeze, laughing, exercise, lifting, going from sitting to standing, during sex, with a full bladder, with movement to the bathroom, and with urgency. UUI > SUI.  Leaks 12 time(s) per day. Has progressively gotten worse.  Pad use: 4-6 pads per day.   She is bothered by her UI symptoms. Has history of mesh sling.   Day time voids 12.  Nocturia: 2 times per night to void. Voiding dysfunction: she empties her bladder well.  does not use a catheter to empty bladder.  When urinating, she feels she has no difficulties Drinks: 2-3 cups coffee, 2-3 glasses water, lipton tea, pepsi   UTIs:  0  UTI's in the last year.   Denies history of blood in urine and kidney or bladder stones  Pelvic Organ Prolapse Symptoms:                  She Denies a feeling of a bulge the vaginal area.   Bowel Symptom: Bowel movements: varies between constipation and loose stool Stool consistency: hard, soft , or loose Straining: no.  Splinting: no.  Incomplete evacuation: no.  She Admits to accidental bowel leakage / fecal incontinence  Occurs: a few time(s) per month  Consistency with leakage: soft  or liquid Bowel regimen: none Last colonoscopy: Date 6/ 2020, Results polyps removed.   Sexual Function Sexually active: yes.  Pain with sex: Yes, discomfort with insertion  Pelvic Pain Denies pelvic  pain    Past Medical History:  Past Medical History:  Diagnosis Date   Allergy    Anxiety    Cataract    Bil   Depression    History of bloody stools    in past/   History of muscle pain    Bil pain in bil arms and shoulders/ chronic since last year/left shoulder started this year   Loose stools    on and off/occasional blood in stool     Past Surgical History:   Past Surgical History:  Procedure Laterality Date   ABDOMINAL HYSTERECTOMY     with bladder tack   APPENDECTOMY     BREAST BIOPSY Right 2001   benign   CHOLECYSTECTOMY     COLONOSCOPY     TUBAL LIGATION       Past OB/GYN History: OB History  Gravida Para Term Preterm AB Living  2 2       2   SAB IAB Ectopic Multiple Live Births               # Outcome Date GA Lbr Len/2nd Weight Sex Delivery Anes PTL Lv  2 Para           1 Para            SVD x2 S/p hysterectomy   Medications: She has a current medication list which includes the following prescription(s): acetaminophen, calcium carbonate, cetirizine, vitamin d, citalopram, collagen, ezetimibe, melatonin, mirabegron er, multiple vitamins-minerals, and  turmeric.   Allergies: Patient is allergic to latex, penicillins, ciprofloxacin, and codeine.   Social History:  Social History   Tobacco Use   Smoking status: Never   Smokeless tobacco: Never  Vaping Use   Vaping Use: Never used  Substance Use Topics   Alcohol use: No    Alcohol/week: 0.0 standard drinks   Drug use: No    Relationship status: married Regular exercise: No History of abuse: No  Family History:   Family History  Problem Relation Age of Onset   Other Mother        Prediabetes   Colitis Mother    Pneumonia Father    Arthritis Sister    Heart attack Maternal Uncle 71   Heart attack Paternal Grandmother 71   Colon cancer Neg Hx      Review of Systems: Review of Systems  Constitutional:  Positive for malaise/fatigue. Negative for fever and weight loss.  Respiratory:   Positive for shortness of breath. Negative for cough and wheezing.   Cardiovascular:  Positive for leg swelling. Negative for chest pain and palpitations.  Gastrointestinal:  Positive for blood in stool. Negative for abdominal pain.  Genitourinary:  Negative for dysuria.  Musculoskeletal:  Negative for myalgias.  Skin:  Negative for rash.  Neurological:  Negative for dizziness and headaches.  Endo/Heme/Allergies:  Does not bruise/bleed easily.  Psychiatric/Behavioral:  Positive for depression. The patient is nervous/anxious.     OBJECTIVE Physical Exam: Vitals:   08/06/21 1532  BP: 130/69  Pulse: 83  Weight: 181 lb (82.1 kg)  Height: 5' 1.5" (1.562 m)    Physical Exam Constitutional:      General: She is not in acute distress. Pulmonary:     Effort: Pulmonary effort is normal.  Abdominal:     General: There is no distension.     Palpations: Abdomen is soft.     Tenderness: There is no abdominal tenderness. There is no rebound.  Musculoskeletal:        General: No swelling. Normal range of motion.  Skin:    General: Skin is warm and dry.     Findings: No rash.  Neurological:     Mental Status: She is alert and oriented to person, place, and time.  Psychiatric:        Mood and Affect: Mood normal.        Behavior: Behavior normal.     GU / Detailed Urogynecologic Evaluation:  Pelvic Exam: Normal external female genitalia with erythema over the vulva and groin; Bartholin's and Skene's glands normal in appearance; urethral meatus normal in appearance, no urethral masses or discharge.   CST: positive  s/p hysterectomy: Speculum exam reveals normal vaginal mucosa with  atrophy and normal vaginal cuff.  Adnexa no mass, fullness, tenderness.    Pelvic floor strength I/V, puborectalis II/V external anal sphincter I/V  Pelvic floor musculature: Right levator non-tender, Right obturator non-tender, Left levator non-tender, Left obturator non-tender  POP-Q:   POP-Q  -2                                             Aa   -2  Ba  -8                                              C   2.5                                            Gh  4.5                                            Pb  8                                            tvl   -2.5                                            Ap  -2.5                                            Bp                                                 D     Rectal Exam:  Fecal soiling noted, diminished sphincter tone, no distal rectocele, enterocoele not present, no rectal masses, noted dyssynergia when asking the patient to bear down.  Post-Void Residual (PVR) by Bladder Scan: In order to evaluate bladder emptying, we discussed obtaining a postvoid residual and she agreed to this procedure.  Procedure: The ultrasound unit was placed on the patient's abdomen in the suprapubic region after the patient had voided. A PVR of 62 ml was obtained by bladder scan.  Laboratory Results: Unable to provide urine sample  ASSESSMENT AND PLAN Ms. Stefanski is a 75 y.o. with:  1. Overactive bladder   2. SUI (stress urinary incontinence, female)   3. Incontinence of feces, unspecified fecal incontinence type    OAB We discussed the symptoms of overactive bladder (OAB), which include urinary urgency, urinary frequency, nocturia, with or without urge incontinence.  While we do not know the exact etiology of OAB, several treatment options exist. We discussed management including behavioral therapy (decreasing bladder irritants, urge suppression strategies, timed voids, bladder retraining), physical therapy, medication.  - She is interested in physical therapy, referral placed.  - Will work on reducing tea and soda intake.  - She would also like to try a medication, Myrbetriq 25mg  ordered. For Beta-3 agonist medication, we discussed the potential side effect of elevated blood pressure which is more  likely to occur in individuals with uncontrolled hypertension.  2. SUI - not as bothersome as urgency symptoms. Will address with physical therapy.   3. Accidental Bowel Leakage:  - Treatment options include  anti-diarrhea medication (loperamide/ Imodium OTC or prescription lomotil), fiber supplements, physical therapy, and possible sacral neuromodulation or surgery.   - She will use loperamide when she leaves the house to prevent accidents. Will also use psyllium fiber supplement daily.  - Discussed that pelvic PT can also improve this symptom. - Recommended zinc based cream for vulvar irritation  Follow up 6 weeks.   [name], MD

## 2021-08-06 NOTE — Patient Instructions (Addendum)
Today we talked about ways to manage bladder urgency such as altering your diet to avoid irritative beverages and foods (bladder diet) as well as attempting to decrease stress and other exacerbating factors.    The Most Bothersome Foods* The Least Bothersome Foods*  Coffee - Regular & Decaf Tea - caffeinated Carbonated beverages - cola, non-colas, diet & caffeine-free Alcohols - Beer, Red Wine, White Wine, Champagne Fruits - Grapefruit, [place], Orange, [employer] - Cranberry, Grapefruit, Orange, Pineapple Vegetables - Tomato & Tomato Products Flavor Enhancers - Hot peppers, Spicy foods, Chili, Horseradish, Vinegar, Monosodium glutamate (MSG) Artificial Sweeteners - NutraSweet, Sweet 'N Low, Equal (sweetener), Saccharin Ethnic foods - [place], [place], [place] food Express Scripts - low-fat & whole Fruits - Bananas, Blueberries, Honeydew melon, Pears, Raisins, Watermelon Vegetables - Broccoli, Brussels Sprouts, [place], Carrots, Cauliflower, [place], Cucumber, Mushrooms, Peas, Radishes, Squash, Zucchini, White potatoes, Sweet potatoes & yams Poultry - Chicken, Eggs, [place], [employer] - Beef, Programmer, multimedia, Lamb Seafood - Shrimp, [place] fish, Salmon Grains - Oat, Rice Snacks - Pretzels, Popcorn  *Lissa Morales et al. Diet and its role in interstitial cystitis/bladder pain syndrome (IC/BPS) and comorbid conditions. [place] 2012 Jan 11.   Accidental Bowel Leakage: Our goal is to achieve formed bowel movements daily or every-other-day without leakage.  You may need to try different combinations of the following options to find what works best for you.  Some management options include: Dietary changes (more leafy greens, vegetables and fruits; less processed foods) Fiber supplementation (Metamucil or something with psyllium as active ingredient) Over-the-counter imodium (tablets or liquid) to help solidify the stool and prevent leakage of stool. If you get constipated you can use Miralax  as needed to achieve bowel movements.   Use a zinc based cream on the vulvar area.   We discussed the symptoms of overactive bladder (OAB), which include urinary urgency, urinary frequency, night-time urination, with or without urge incontinence.  We discussed management including behavioral therapy (decreasing bladder irritants by following a bladder diet, urge suppression strategies, timed voids, bladder retraining), physical therapy, medication; and for refractory cases posterior tibial nerve stimulation, sacral neuromodulation, and intravesical botulinum toxin injection.   For Beta-3 agonist medication, we discussed the potential side effect of elevated blood pressure which is more likely to occur in individuals with uncontrolled hypertension. You were given a prescription for Myrbetriq 25 mg.  It can take a month to start working so give it time, but if you have bothersome side effects call sooner and we can try a different medication.  Call us if you have trouble filling the prescription.

## 2021-09-17 NOTE — Progress Notes (Signed)
[facility] Return Visit  SUBJECTIVE  History of Present Illness: Pam Butler is a 75 y.o. female seen in follow-up for overactive bladder and bowel leakage. Plan at last visit was to start Myrbetriq 25mg . She was also referred to pelvic floor PT.    Feels that the urgency has improved, but still has to go every hour and a half. Is able to get to the bathroom without wetting. Feels that it has dried her skin out. Has cut back on the tea and pepsi and is drinking more water.   Past Medical History: Patient  has a past medical history of Allergy, Anxiety, Cataract, Depression, History of bloody stools, History of muscle pain, and Loose stools.   Past Surgical History: She  has a past surgical history that includes Abdominal hysterectomy; Cholecystectomy; Tubal ligation; Breast biopsy (Right, 2001); Colonoscopy; and Appendectomy.   Medications: She has a current medication list which includes the following prescription(s): acetaminophen, calcium carbonate, cetirizine, vitamin d, citalopram, collagen, ezetimibe, melatonin, mirabegron er, multiple vitamins-minerals, and turmeric.   Allergies: Patient is allergic to latex, penicillins, ciprofloxacin, and codeine.   Social History: Patient  reports that she has never smoked. She has never used smokeless tobacco. She reports that she does not drink alcohol and does not use drugs.      OBJECTIVE     Physical Exam: Vitals:   09/18/21 1301  BP: (!) 131/58  Pulse: 74  Weight: 181 lb (82.1 kg)   Gen: No apparent distress, A&O x 3.  Detailed Urogynecologic Evaluation:  Deferred.    ASSESSMENT AND PLAN    [name] is a 75 y.o. with:  1. Overactive bladder     - Will increase Myrbetriq to 50mg  daily. She is concerned about her dry skin, so will just give samples of the 50mg  for now. If she does not have any further issues on the higher dose, then will write a prescription at the next visit.   Return 1 month for follow up.    [name], MD  Time spent: I spent 20 minutes dedicated to the care of this patient on the date of this encounter to include pre-visit review of records, face-to-face time with the patient and post visit documentation

## 2021-09-18 ENCOUNTER — Encounter: Payer: Self-pay | Admitting: Obstetrics and Gynecology

## 2021-09-18 ENCOUNTER — Other Ambulatory Visit: Payer: Self-pay

## 2021-09-18 ENCOUNTER — Ambulatory Visit (INDEPENDENT_AMBULATORY_CARE_PROVIDER_SITE_OTHER): Payer: Medicare HMO | Admitting: Obstetrics and Gynecology

## 2021-09-18 VITALS — BP 131/58 | HR 74 | Wt 181.0 lb

## 2021-09-18 DIAGNOSIS — N3281 Overactive bladder: Secondary | ICD-10-CM

## 2021-09-30 ENCOUNTER — Telehealth: Payer: Self-pay

## 2021-09-30 NOTE — Telephone Encounter (Signed)
-----   Message from [name], [place] sent at 09/19/2021  9:20 AM EST ----- Regarding: Med check Pt given samples for Myrbetriq 50mg  4 boxes (21mo) supply 09/18/2021 Increased from 25mg . Call the pt to see if its helping then start PA

## 2021-09-30 NOTE — Telephone Encounter (Signed)
Pam Butler is a 75 y.o. female was contacted re: medication increase. Pt said she noticed she had diarrhea the first 5 days of taking the 50mg  Myrbetriq. Pt was advised by the pharmacy to stop taking it and to restart it. It the diarrhea comes back to notify Dr. Wannetta Sender. Pt said she is going to to it again and let us know what happens.

## 2021-10-15 DIAGNOSIS — L853 Xerosis cutis: Secondary | ICD-10-CM | POA: Diagnosis not present

## 2021-10-15 DIAGNOSIS — L814 Other melanin hyperpigmentation: Secondary | ICD-10-CM | POA: Diagnosis not present

## 2021-10-15 DIAGNOSIS — D225 Melanocytic nevi of trunk: Secondary | ICD-10-CM | POA: Diagnosis not present

## 2021-10-15 DIAGNOSIS — L821 Other seborrheic keratosis: Secondary | ICD-10-CM | POA: Diagnosis not present

## 2021-10-24 NOTE — Progress Notes (Signed)
 Urogynecology Return Visit  SUBJECTIVE  History of Present Illness: Pam Butler is a 76 y.o. female seen in follow-up for overactive bladder and bowel leakage. Plan at last visit was to start Myrbetriq 50mg . She has not been able to attend pelvic PT.   On the 50mg  of Myrbetriq she developed diarrhea. She stopped the medication and it got better, needed to take immodium. She started taking it again and then developed diarrhea again and needed the immodium. Has noticed that after she eats, then the food "runs right through her" and she has to [place] to the bathroom.   Has a history of a sling (mesh). Denies leakage with cough/ sneeze.   Past Medical History: Patient  has a past medical history of Allergy, Anxiety, Cataract, Depression, History of bloody stools, History of muscle pain, and Loose stools.   Past Surgical History: She  has a past surgical history that includes Abdominal hysterectomy; Cholecystectomy; Tubal ligation; Breast biopsy (Right, 2001); Colonoscopy; and Appendectomy.   Medications: She has a current medication list which includes the following prescription(s): acetaminophen, calcium carbonate, cetirizine, vitamin d, citalopram, collagen, ezetimibe, melatonin, multiple vitamins-minerals, trospium chloride, and turmeric.   Allergies: Patient is allergic to latex, penicillins, ciprofloxacin, and codeine.   Social History: Patient  reports that she has never smoked. She has never used smokeless tobacco. She reports that she does not drink alcohol and does not use drugs.      OBJECTIVE     Physical Exam: Vitals:   10/25/21 1328  BP: 118/77  Pulse: 67    Gen: No apparent distress, A&O x 3.  Detailed Urogynecologic Evaluation:  Deferred.    ASSESSMENT AND PLAN    Pam Butler is a 76 y.o. with:  1. Overactive bladder   2. Incontinence of feces, unspecified fecal incontinence type    OAB:  - For OAB, will try a different medication, trospium 60mg  ER  daily. She will let us know if this is too expensive.  - We discussed refractory therapy including PTNS or Sacral neuromodulation (which can also help with bowel leakage). She is maybe interested in PTNS first but would like to try another medication.   Bowel leakage:  - Recommended follow up with GI since she notices loose stools in relation to eating. We discussed referral but she states she has a GI that she sees.  - Recommended daily psyllium fiber supplementation for stool bulking. If she needs help with constipation, she can take miralax as needed.     Return 6 weeks to review medication effectiveness.   [name], MD  Time spent: I spent 40 minutes dedicated to the care of this patient on the date of this encounter to include pre-visit review of records, face-to-face time with the patient and post visit documentation

## 2021-10-25 ENCOUNTER — Other Ambulatory Visit: Payer: Self-pay

## 2021-10-25 ENCOUNTER — Encounter: Payer: Self-pay | Admitting: Obstetrics and Gynecology

## 2021-10-25 ENCOUNTER — Ambulatory Visit: Payer: Medicare HMO | Admitting: Obstetrics and Gynecology

## 2021-10-25 VITALS — BP 118/77 | HR 67

## 2021-10-25 DIAGNOSIS — N3281 Overactive bladder: Secondary | ICD-10-CM

## 2021-10-25 DIAGNOSIS — R159 Full incontinence of feces: Secondary | ICD-10-CM | POA: Diagnosis not present

## 2021-10-25 MED ORDER — TROSPIUM CHLORIDE ER 60 MG PO CP24: 1.0000 | ORAL_CAPSULE | Freq: Every day | ORAL | 5 refills | Status: DC

## 2021-10-25 NOTE — Patient Instructions (Addendum)
-   Start psyllium fiber supplement daily for prevention of bowel leakage.  - If needed you take miralax for constipation.  - Follow up with GI

## 2021-10-29 ENCOUNTER — Telehealth: Payer: Self-pay

## 2021-10-29 DIAGNOSIS — N3281 Overactive bladder: Secondary | ICD-10-CM

## 2021-10-29 NOTE — Telephone Encounter (Signed)
Pam Butler is a 76 y.o. female called in because Myrbetriq 50mg  is $299. Per month Pt would like to go back to the 25mg  because the cost is less.

## 2021-10-30 ENCOUNTER — Other Ambulatory Visit: Payer: Self-pay | Admitting: Obstetrics and Gynecology

## 2021-10-30 DIAGNOSIS — N3281 Overactive bladder: Secondary | ICD-10-CM

## 2021-10-30 MED ORDER — MIRABEGRON ER 25 MG PO TB24: 25.0000 mg | ORAL_TABLET | Freq: Two times a day (BID) | ORAL | 5 refills | Status: DC

## 2021-11-12 ENCOUNTER — Encounter: Payer: Self-pay | Admitting: Internal Medicine

## 2021-11-12 NOTE — Progress Notes (Signed)
Subjective:    Patient ID: Pam Butler, female    DOB: Jun 23, 1946, 76 y.o.   MRN: [id number]  This visit occurred during the SARS-CoV-2 public health emergency.  Safety protocols were in place, including screening questions prior to the visit, additional usage of staff PPE, and extensive cleaning of exam room while observing appropriate contact time as indicated for disinfecting solutions.    HPI The patient is here for an acute visit.   B/l leg swelling L > R leg --  increased swelling in both ankles.  This started> 2 months. She feels fullness in both calves.  Sitting an raising her legs helps.    She feels crunching in the foot.  She thought that was probably arthritis.  When raising right arm she has a pulling sensation in her right chest.  She has an intermittent pulling sensation in her right lower arm.    She does have some increased shortness of breath.  She is not exercising regularly.  She feels she has cut down on how much she is eating and that has resulted in a little bit of weight loss.  She tries to watch her salt intake.  She uses the salt shaker when needed.  She eats out once a week on average.  She does eat meat egg, sausage biscuit couple of times a week.  She does eat so much meat.  Sometimes she eats cereal.  She finds her self cooking less and eating simple meals.  Medications and allergies reviewed with patient and updated if appropriate.  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 Take one prn     calcium carbonate (CALCIUM 600) 600 MG TABS tablet Take 1 tablet (600 mg total) by mouth daily with breakfast. 30 tablet    cetirizine (ZYRTEC) 10 MG tablet Take 1 tablet (10 mg total) by mouth daily. 90 tablet 1   Cholecalciferol (VITAMIN D) 50 MCG (2000 UT) CAPS 1 cap daily 30 capsule 0   citalopram (CELEXA) 20 MG tablet TAKE 1 TABLET BY MOUTH EVERY DAY 90 tablet 4   COLLAGEN PO Take by mouth.     ezetimibe (ZETIA) 10 MG tablet Take 1 tablet (10 mg total) by mouth daily. 90 tablet 3   Melatonin 10 MG CAPS Take by mouth as needed.     mirabegron ER (MYRBETRIQ) 25 MG TB24 tablet Take 1 tablet (25 mg total) by mouth daily. 30 tablet 5   Multiple Vitamins-Minerals (WOMENS MULTI VITAMIN & MINERAL PO) Take by mouth. Centrum Womens MVI-Take one daily     TURMERIC PO Take by mouth.     No current facility-administered medications on file prior to visit.    Past Medical History:  Diagnosis Date   Allergy  Anxiety    Cataract    Bil   Depression    History of bloody stools    in past/   History of muscle pain    Bil pain in bil arms and shoulders/ chronic since last year/left shoulder started this year   Loose stools    on and off/occasional blood in stool    Past Surgical History:  Procedure Laterality Date   ABDOMINAL HYSTERECTOMY     with bladder tack   APPENDECTOMY     BREAST BIOPSY Right 2001   benign   CHOLECYSTECTOMY     COLONOSCOPY     TUBAL LIGATION      Social History   Socioeconomic History   Marital status: Married    Spouse name: Not on file   Number of children: 2   Years of education: Not on file   Highest education level: Not on file  Occupational History    Occupation: retired  Tobacco Use   Smoking status: Never   Smokeless tobacco: Never  Vaping Use   Vaping Use: Never used  Substance and Sexual Activity   Alcohol use: No    Alcohol/week: 0.0 standard drinks   Drug use: No   Sexual activity: Yes    Birth control/protection: Surgical  Other Topics Concern   Not on file  Social History Narrative   Exercise: none   Social Determinants of Health   Financial Resource Strain: Not on file  Food Insecurity: Not on file  Transportation Needs: Not on file  Physical Activity: Not on file  Stress: Not on file  Social Connections: Not on file    Family History  Problem Relation Age of Onset   Other Mother        Prediabetes   Colitis Mother    Pneumonia Father    Arthritis Sister    Heart attack Maternal Uncle 67   Heart attack Paternal Grandmother 48   Colon cancer Neg Hx     Review of Systems  Constitutional:  Negative for chills and fever.  Respiratory:  Positive for shortness of breath (more than she was). Negative for cough and wheezing.   Cardiovascular:  Positive for leg swelling. Negative for chest pain and palpitations.  Gastrointestinal:        Increased gas - discomfort in chest - relieve with tums  Skin:  Negative for color change and rash.      Objective:   Vitals:   11/13/21 1436  BP: 120/72  Pulse: 68  Temp: 98.2 F (36.8 C)  SpO2: 98%   BP Readings from Last 3 Encounters:  11/13/21 120/72  10/25/21 118/77  09/18/21 (!) 131/58   Wt Readings from Last 3 Encounters:  11/13/21 174 lb (78.9 kg)  09/18/21 181 lb (82.1 kg)  08/06/21 181 lb (82.1 kg)   Body mass index is 31.83 kg/m.   Physical Exam    Constitutional: Appears well-developed and well-nourished. No distress.  Head: Normocephalic and atraumatic.  Neck: Neck supple. No tracheal deviation present. No thyromegaly present.  No cervical lymphadenopathy Cardiovascular: Normal rate, regular rhythm and normal heart sounds.  2/6 systolic  murmur heard. No carotid bruit .  1 + b/l LE edema Pulmonary/Chest: Effort normal and breath sounds normal. No respiratory distress. No has no wheezes. No rales.  Skin: Skin is warm and dry. Not diaphoretic.  Psychiatric: Normal mood and affect. Behavior is normal.       Assessment & Plan:    See Problem List for  Assessment and Plan of chronic medical problems.

## 2021-11-12 NOTE — Patient Instructions (Addendum)
° ° °  Medications changes include :   lasix 20 mg daily  Your prescription(s) have been submitted to your pharmacy. Please take as directed and contact our office if you believe you are having problem(s) with the medication(s).   A Echo was ordered.   Someone from their office will call you to schedule an appointment.    Try compression socks.  Continue elevating your legs Work on weight loss Increase your exercise Work on decreasing your salt intake   Follow up in 3-4 weeks.

## 2021-11-13 ENCOUNTER — Other Ambulatory Visit: Payer: Self-pay

## 2021-11-13 ENCOUNTER — Ambulatory Visit (INDEPENDENT_AMBULATORY_CARE_PROVIDER_SITE_OTHER): Payer: Medicare HMO | Admitting: Internal Medicine

## 2021-11-13 VITALS — BP 120/72 | HR 68 | Temp 98.2°F | Ht 62.0 in | Wt 174.0 lb

## 2021-11-13 DIAGNOSIS — R011 Cardiac murmur, unspecified: Secondary | ICD-10-CM

## 2021-11-13 DIAGNOSIS — R0609 Other forms of dyspnea: Secondary | ICD-10-CM | POA: Diagnosis not present

## 2021-11-13 DIAGNOSIS — R600 Localized edema: Secondary | ICD-10-CM | POA: Diagnosis not present

## 2021-11-13 MED ORDER — FUROSEMIDE 20 MG PO TABS: 20.0000 mg | ORAL_TABLET | Freq: Every day | ORAL | 5 refills | Status: DC

## 2021-11-13 NOTE — Assessment & Plan Note (Signed)
Chronic 2/6 systolic murmur most prominent in RSB Echocardiogram ordered

## 2021-11-13 NOTE — Assessment & Plan Note (Signed)
Chronic Has worsened over the past couple of months-probably longer She is overweight, not active and noncompliant with a low-sodium diet Likely related to venous insufficiency, but will get an echocardiogram to rule out possible diastolic/systolic dysfunction and evaluate her murmur She is interested in medication-start Lasix 20 mg daily Stressed low-sodium diet-advised her to start checking labels Continue to elevate legs Consider compression socks Advised weight loss Stressed the importance of regular exercise Follow-up in 3-4 weeks

## 2021-11-13 NOTE — Assessment & Plan Note (Signed)
Chronic Possibly increased She does also have increased leg edema Check echocardiogram to rule out systolic/diastolic dysfunction Stressed weight loss and regular exercise If no improvement needs further cardiac evaluation-she also feels this is related to deconditioning Follow-up in 3-4 weeks

## 2021-11-26 ENCOUNTER — Ambulatory Visit (HOSPITAL_COMMUNITY): Payer: Medicare HMO | Attending: Cardiology

## 2021-11-26 ENCOUNTER — Other Ambulatory Visit: Payer: Self-pay

## 2021-11-26 DIAGNOSIS — R600 Localized edema: Secondary | ICD-10-CM

## 2021-11-26 DIAGNOSIS — R011 Cardiac murmur, unspecified: Secondary | ICD-10-CM | POA: Diagnosis not present

## 2021-11-26 LAB — ECHOCARDIOGRAM COMPLETE
Area-P 1/2: 3.31 cm2
S' Lateral: 3.2 cm

## 2021-12-05 DIAGNOSIS — M65842 Other synovitis and tenosynovitis, left hand: Secondary | ICD-10-CM | POA: Diagnosis not present

## 2021-12-05 DIAGNOSIS — M79641 Pain in right hand: Secondary | ICD-10-CM | POA: Diagnosis not present

## 2021-12-05 DIAGNOSIS — M65841 Other synovitis and tenosynovitis, right hand: Secondary | ICD-10-CM | POA: Diagnosis not present

## 2021-12-05 DIAGNOSIS — M25532 Pain in left wrist: Secondary | ICD-10-CM | POA: Diagnosis not present

## 2021-12-05 DIAGNOSIS — M25531 Pain in right wrist: Secondary | ICD-10-CM | POA: Insufficient documentation

## 2021-12-05 DIAGNOSIS — M65332 Trigger finger, left middle finger: Secondary | ICD-10-CM | POA: Insufficient documentation

## 2021-12-06 ENCOUNTER — Other Ambulatory Visit: Payer: Self-pay

## 2021-12-06 ENCOUNTER — Encounter: Payer: Self-pay | Admitting: Obstetrics and Gynecology

## 2021-12-06 ENCOUNTER — Ambulatory Visit: Payer: Medicare HMO | Admitting: Obstetrics and Gynecology

## 2021-12-06 VITALS — BP 101/53 | HR 74

## 2021-12-06 DIAGNOSIS — N3281 Overactive bladder: Secondary | ICD-10-CM | POA: Diagnosis not present

## 2021-12-06 DIAGNOSIS — R197 Diarrhea, unspecified: Secondary | ICD-10-CM

## 2021-12-06 MED ORDER — VIBEGRON 75 MG PO TABS: 1.0000 | ORAL_TABLET | Freq: Every day | ORAL | 5 refills | Status: DC

## 2021-12-06 NOTE — Progress Notes (Signed)
[facility] Return Visit  SUBJECTIVE  History of Present Illness: Pam Butler is a 76 y.o. female seen in follow-up for overactive bladder and bowel leakage.   Last visit was prescribed trospium but this cost almost $300. She went back to the Myrbetriq 25mg  but then only stayed on it for a week because she was concerned about the diarrhea. Started taking fibercon which helps with the bowel leakage, but she reports she still has urgency and diarrhea after eating. Has not seen GI.  Has a history of a sling (mesh). Denies leakage with cough/ sneeze.   Past Medical History: Patient  has a past medical history of Allergy, Anxiety, Cataract, Depression, History of bloody stools, History of muscle pain, and Loose stools.   Past Surgical History: She  has a past surgical history that includes Abdominal hysterectomy; Cholecystectomy; Tubal ligation; Breast biopsy (Right, 2001); Colonoscopy; and Appendectomy.   Medications: She has a current medication list which includes the following prescription(s): vibegron, acetaminophen, calcium carbonate, cetirizine, vitamin d, citalopram, collagen, ezetimibe, furosemide, melatonin, mirabegron er, multiple vitamins-minerals, and turmeric.   Allergies: Patient is allergic to latex, penicillins, ciprofloxacin, and codeine.   Social History: Patient  reports that she has never smoked. She has never used smokeless tobacco. She reports that she does not drink alcohol and does not use drugs.      OBJECTIVE     Physical Exam: Vitals:   12/06/21 1339  BP: (!) 101/53  Pulse: 74    Gen: No apparent distress, A&O x 3.  Detailed Urogynecologic Evaluation:  Deferred.    ASSESSMENT AND PLAN    Pam Butler is a 75 y.o. with:  1. Diarrhea, unspecified type   2. Overactive bladder     OAB:  - For OAB, will try Gemtesa 75mg . May need to get authorization. Samples provided. - She is still considering PTNS first but would like to try another  medication.   Bowel leakage/ Diarrhea:  - Referral placed to GI- concern for IBS/ colitis or other etiology for diarrhea - continue fiber supplementation for stool bulking  Return 6 weeks   [name], MD

## 2021-12-15 ENCOUNTER — Encounter: Payer: Self-pay | Admitting: Internal Medicine

## 2021-12-15 NOTE — Patient Instructions (Addendum)
? ? ? ?  Blood work was ordered.  Chest xray today.  ? ? ?Medications changes include :   none ? ? ?Your prescription(s) have been sent to your pharmacy.  ? ? ?A referral was ordered for pulmonary and cardiology.      Someone from that office will call you to schedule an appointment.  ? ? ?Return for follow up in August as scheduled. ? ?

## 2021-12-15 NOTE — Progress Notes (Signed)
? ? ? ? ?Subjective:  ? ? Patient ID: Pam Butler, female    DOB: June 22, 1946, 76 y.o.   MRN: [id number] ? ?This visit occurred during the SARS-CoV-2 public health emergency.  Safety protocols were in place, including screening questions prior to the visit, additional usage of staff PPE, and extensive cleaning of exam room while observing appropriate contact time as indicated for disinfecting solutions.   ? ? ?HPI ?Pam Butler is here for follow up of her chronic medical problems, including DOE and leg edema.  ? ? ?Started lasix 20 mg daily 4 weeks ago. ?Echo -  EF N, no DD,  no valve dz ? ?Leg swelling better.  She feels her DOE is unchanged - it is worse since covid (sept '22).  ? ?States occasional chest tightness with activity that improves with rest.   ? ?She is still grieving her mother's death and dealing with some anxiety and depression. ? ?Medications and allergies reviewed with patient and updated if appropriate. ? ?Current Outpatient Medications on File Prior to Visit  ?Medication Sig Dispense Refill  ? Acetaminophen (TYLENOL EXTRA STRENGTH PO) Take by mouth. Take one prn    ? calcium carbonate (CALCIUM 600) 600 MG TABS tablet Take 1 tablet (600 mg total) by mouth daily with breakfast. 30 tablet   ? cetirizine (ZYRTEC) 10 MG tablet Take 1 tablet (10 mg total) by mouth daily. 90 tablet 1  ? Cholecalciferol (VITAMIN D) 50 MCG (2000 UT) CAPS 1 cap daily 30 capsule 0  ? citalopram (CELEXA) 20 MG tablet TAKE 1 TABLET BY MOUTH EVERY DAY 90 tablet 4  ? COLLAGEN PO Take by mouth.    ? ezetimibe (ZETIA) 10 MG tablet Take 1 tablet (10 mg total) by mouth daily. 90 tablet 3  ? furosemide (LASIX) 20 MG tablet Take 1 tablet (20 mg total) by mouth daily. 30 tablet 5  ? Melatonin 10 MG CAPS Take by mouth as needed.    ? mirabegron ER (MYRBETRIQ) 25 MG TB24 tablet Take 1 tablet (25 mg total) by mouth daily. 30 tablet 5  ? Multiple Vitamins-Minerals (WOMENS MULTI VITAMIN & MINERAL PO) Take by mouth. Centrum Womens MVI-Take one daily     ? TURMERIC PO Take by mouth.    ? Vibegron 75 MG TABS Take 1 tablet by mouth daily. 30 tablet 5  ? ?No current facility-administered medications on file prior to visit.  ? ? ? ?Review of Systems  ?Constitutional:  Negative for fever.  ?Respiratory:  Positive for shortness of breath and wheezing (occ). Negative for cough.   ?Cardiovascular:  Positive for chest pain (tightness with movement - better with rest) and leg swelling. Negative for palpitations.  ?Neurological:  Negative for light-headedness and headaches.  ? ?   ?Objective:  ? ?Vitals:  ? 12/16/21 1344  ?BP: 110/74  ?Pulse: 78  ?Temp: 98.1 ?F (36.7 ?C)  ?SpO2: 98%  ? ?BP Readings from Last 3 Encounters:  ?12/16/21 110/74  ?12/06/21 (!) 101/53  ?11/13/21 120/72  ? ?Wt Readings from Last 3 Encounters:  ?12/16/21 171 lb (77.6 kg)  ?11/13/21 174 lb (78.9 kg)  ?09/18/21 181 lb (82.1 kg)  ? ?Body mass index is 31.28 kg/m?. ? ?  ?Physical Exam ?Constitutional:   ?   General: She is not in acute distress. ?   Appearance: Normal appearance.  ?HENT:  ?   Head: Normocephalic and atraumatic.  ?Eyes:  ?   Conjunctiva/sclera: Conjunctivae normal.  ?Cardiovascular:  ?   Rate and  Rhythm: Normal rate and regular rhythm.  ?   Heart sounds: Normal heart sounds. No murmur heard. ?Pulmonary:  ?   Effort: Pulmonary effort is normal. No respiratory distress.  ?   Breath sounds: Normal breath sounds. No wheezing.  ?Musculoskeletal:  ?   Cervical back: Neck supple.  ?   Right lower leg: No edema.  ?   Left lower leg: No edema.  ?Lymphadenopathy:  ?   Cervical: No cervical adenopathy.  ?Skin: ?   Findings: No rash.  ?Neurological:  ?   Mental Status: She is alert. Mental status is at baseline.  ?Psychiatric:     ?   Mood and Affect: Mood normal.     ?   Behavior: Behavior normal.  ? ?   ? ?Lab Results  ?Component Value Date  ? WBC 6.8 05/31/2021  ? HGB 12.5 05/31/2021  ? HCT 36.2 05/31/2021  ? PLT 295.0 05/31/2021  ? GLUCOSE 83 05/31/2021  ? CHOL 214 (H) 05/31/2021  ? TRIG 334.0 (H)  05/31/2021  ? HDL 43.00 05/31/2021  ? LDLDIRECT 128.0 05/31/2021  ? [place]  05/16/2020  ?   Comment:  ?   . ?LDL cholesterol not calculated. Triglyceride levels ?greater than 400 mg/dL invalidate calculated LDL results. ?. ?Reference range: <100 ?Marland Kitchen ?Desirable range <100 mg/dL for primary prevention;   ?<70 mg/dL for patients with CHD or diabetic patients  ?with > or = 2 CHD risk factors. ?. ?LDL-C is now calculated using the Martin-Hopkins  ?calculation, which is a validated novel method providing  ?better accuracy than the Friedewald equation in the  ?estimation of LDL-C.  ?Cresenciano Genre et al. Annamaria Helling. 2202;542(70): 2061-2068  ?(http://education.[facility].com/faq/FAQ164) ?  ? ALT 17 05/31/2021  ? AST 18 05/31/2021  ? NA 137 05/31/2021  ? K 3.9 05/31/2021  ? CL 105 05/31/2021  ? CREATININE 0.89 05/31/2021  ? BUN 21 05/31/2021  ? CO2 21 05/31/2021  ? TSH 1.59 05/31/2021  ? HGBA1C 5.5 05/31/2021  ? ? ? ?Assessment & Plan:  ? ? ?See Problem List for Assessment and Plan of chronic medical problems.  ? ? ? ?

## 2021-12-16 ENCOUNTER — Ambulatory Visit (INDEPENDENT_AMBULATORY_CARE_PROVIDER_SITE_OTHER): Payer: Medicare HMO

## 2021-12-16 ENCOUNTER — Other Ambulatory Visit: Payer: Self-pay

## 2021-12-16 ENCOUNTER — Ambulatory Visit (INDEPENDENT_AMBULATORY_CARE_PROVIDER_SITE_OTHER): Payer: Medicare HMO | Admitting: Internal Medicine

## 2021-12-16 VITALS — BP 110/74 | HR 78 | Temp 98.1°F | Ht 62.0 in | Wt 171.0 lb

## 2021-12-16 DIAGNOSIS — E7849 Other hyperlipidemia: Secondary | ICD-10-CM

## 2021-12-16 DIAGNOSIS — R7303 Prediabetes: Secondary | ICD-10-CM | POA: Diagnosis not present

## 2021-12-16 DIAGNOSIS — R600 Localized edema: Secondary | ICD-10-CM | POA: Diagnosis not present

## 2021-12-16 DIAGNOSIS — F3289 Other specified depressive episodes: Secondary | ICD-10-CM | POA: Diagnosis not present

## 2021-12-16 DIAGNOSIS — R0789 Other chest pain: Secondary | ICD-10-CM | POA: Diagnosis not present

## 2021-12-16 DIAGNOSIS — R0600 Dyspnea, unspecified: Secondary | ICD-10-CM | POA: Diagnosis not present

## 2021-12-16 DIAGNOSIS — R0609 Other forms of dyspnea: Secondary | ICD-10-CM | POA: Diagnosis not present

## 2021-12-16 DIAGNOSIS — F419 Anxiety disorder, unspecified: Secondary | ICD-10-CM | POA: Diagnosis not present

## 2021-12-16 DIAGNOSIS — R69 Illness, unspecified: Secondary | ICD-10-CM | POA: Diagnosis not present

## 2021-12-16 LAB — CBC WITH DIFFERENTIAL/PLATELET
Basophils Absolute: 0 10*3/uL (ref 0.0–0.1)
Basophils Relative: 0.5 % (ref 0.0–3.0)
Eosinophils Absolute: 0.4 10*3/uL (ref 0.0–0.7)
Eosinophils Relative: 4.3 % (ref 0.0–5.0)
HCT: 37 % (ref 36.0–46.0)
Hemoglobin: 12.8 g/dL (ref 12.0–15.0)
Lymphocytes Relative: 31.8 % (ref 12.0–46.0)
Lymphs Abs: 3 10*3/uL (ref 0.7–4.0)
MCHC: 34.7 g/dL (ref 30.0–36.0)
MCV: 87.6 fl (ref 78.0–100.0)
Monocytes Absolute: 0.8 10*3/uL (ref 0.1–1.0)
Monocytes Relative: 9 % (ref 3.0–12.0)
Neutro Abs: 5.1 10*3/uL (ref 1.4–7.7)
Neutrophils Relative %: 54.4 % (ref 43.0–77.0)
Platelets: 318 10*3/uL (ref 150.0–400.0)
RBC: 4.23 Mil/uL (ref 3.87–5.11)
RDW: 13.5 % (ref 11.5–15.5)
WBC: 9.4 10*3/uL (ref 4.0–10.5)

## 2021-12-16 LAB — COMPREHENSIVE METABOLIC PANEL
ALT: 17 U/L (ref 0–35)
AST: 21 U/L (ref 0–37)
Albumin: 4.8 g/dL (ref 3.5–5.2)
Alkaline Phosphatase: 68 U/L (ref 39–117)
BUN: 24 mg/dL — ABNORMAL HIGH (ref 6–23)
CO2: 25 mEq/L (ref 19–32)
Calcium: 10.1 mg/dL (ref 8.4–10.5)
Chloride: 101 mEq/L (ref 96–112)
Creatinine, Ser: 1.08 mg/dL (ref 0.40–1.20)
GFR: 49.81 mL/min — ABNORMAL LOW (ref >60.00)
Glucose, Bld: 91 mg/dL (ref 70–99)
Potassium: 3.6 mEq/L (ref 3.5–5.1)
Sodium: 137 mEq/L (ref 135–145)
Total Bilirubin: 0.5 mg/dL (ref 0.2–1.2)
Total Protein: 8.1 g/dL (ref 6.0–8.3)

## 2021-12-16 LAB — LIPID PANEL
Cholesterol: 235 mg/dL — ABNORMAL HIGH (ref 0–200)
HDL: 41.2 mg/dL (ref >39.00)
NonHDL: 193.52
Total CHOL/HDL Ratio: 6
Triglycerides: 317 mg/dL — ABNORMAL HIGH (ref 0.0–149.0)
VLDL: 63.4 mg/dL — ABNORMAL HIGH (ref 0.0–40.0)

## 2021-12-16 LAB — TSH: TSH: 2.22 u[IU]/mL (ref 0.35–5.50)

## 2021-12-16 LAB — LDL CHOLESTEROL, DIRECT: Direct LDL: 149 mg/dL

## 2021-12-16 LAB — HEMOGLOBIN A1C: Hgb A1c MFr Bld: 5.7 % (ref 4.6–6.5)

## 2021-12-16 NOTE — Assessment & Plan Note (Signed)
Chronic ?Check lipid panel ?Has not tolerated statins ?Continue Zetia 10 mg daily ?

## 2021-12-16 NOTE — Assessment & Plan Note (Signed)
Subacute - started after covid in 06/2021 ?Not improving ?? pulm or cardiac ?Has some chest tightness with activity ?Referral to cardio and pulm ?

## 2021-12-16 NOTE — Assessment & Plan Note (Addendum)
Chronic ?Improved with lasix 20 mg daily - ok to skip days and take as needed ?CMP today ?

## 2021-12-16 NOTE — Assessment & Plan Note (Signed)
Chronic ?? Controlled or not ?Not sure if she is taking celexa 10 mg or 20 mg ?Will make sure she is taking 20 mg daily of the celexa ?If not improving - advised to consider switching to a different medication ?

## 2021-12-16 NOTE — Assessment & Plan Note (Signed)
New ?States chest tightness with activity - better with rest ?Has DOE since covid (sept '22) ?Concern for cardiac ischemia - referred to cardiology ?

## 2021-12-16 NOTE — Assessment & Plan Note (Signed)
Chronic Check a1c Low sugar / carb diet Stressed regular exercise  

## 2021-12-16 NOTE — Assessment & Plan Note (Addendum)
Chronic ?Controlled, stable ?Continue celexa  ?

## 2021-12-18 ENCOUNTER — Other Ambulatory Visit: Payer: Self-pay | Admitting: Internal Medicine

## 2021-12-19 ENCOUNTER — Telehealth: Payer: Self-pay

## 2021-12-19 NOTE — Telephone Encounter (Signed)
Pt called back for her results. ? ? I advised the pt of Dr. Quay Burow result note saying Your kidney function is slightly decreased, but you do look dehydrated by blood work.  Make sure you are drinking plenty of water throughout the day.  Your liver tests, thyroid function and blood counts are normal.  Your A1c is 5.7% so your sugars are in the prediabetic range.  Your cholesterol is higher than ideal.  Ideally should work on diet and exercise to help improve your cholesterol. ? ?Pt requested a copy be mailed. Printed and placed to be mailed. ? ?FYI ?

## 2022-01-04 NOTE — Progress Notes (Signed)
?Cardiology Office Note:   ? ?Date:  01/06/2022  ? ?ID:  Pam Butler, DOB 26-Jul-1946, MRN [id number] ? ?PCP:  [name], MD  ? ?[facility] ?Cardiologist:  [name], MD ?Referring MD: [name], MD  ? ?Chief Complaint/Reason for Referral: Chest pain and shortness of breath ? ?ASSESSMENT:   ? ?1. Chest pain, unspecified type   ? ? ?PLAN:   ? ?In order of problems listed above: ? We will obtain a coronary CTA to evaluate further.  If the patient has mild obstructive coronary artery disease, they will require a statin (with goal LDL < 70) and aspirin, if they have high-grade disease we will need to consider optimal medical therapy and if symptoms are refractory to medical therapy, then a cardiac catheterization with possible PCI will be pursued to alleviate symptoms.  If they have high risk disease we will proceed directly to cardiac catheterization.  Follow up in 6 months.  Echocardiogram was reassuring. ? ? ?Dispo:  Return in about 6 months (around 07/09/2022).  ? ?  ? ?Medication Adjustments/Labs and Tests Ordered: ?Current medicines are reviewed at length with the patient today.  Concerns regarding medicines are outlined above.  ? ?Tests Ordered: ?Orders Placed This Encounter  ?Procedures  ? CT CORONARY MORPH W/CTA COR W/SCORE W/CA W/CM &/OR WO/CM  ? Basic metabolic panel  ? EKG 12-Lead  ? ? ?Medication Changes: ?Meds ordered this encounter  ?Medications  ? isosorbide mononitrate (IMDUR) 30 MG 24 hr tablet  ?  Sig: Take 1 tablet (30 mg total) by mouth at bedtime.  ?  Dispense:  90 tablet  ?  Refill:  3  ? nitroGLYCERIN (NITROSTAT) 0.4 MG SL tablet  ?  Sig: Place 1 tablet (0.4 mg total) under the tongue every 5 (five) minutes as needed for chest pain.  ?  Dispense:  25 tablet  ?  Refill:  3  ? ? ?History of Present Illness:   ? ?FOCUSED PROBLEM LIST:   ?1.  COVID infection September 2022 ?2.  Depression ?3.  Anxiety ? ?The patient is a 76 y.o. female with the indicated medical history here  for recommendations regarding chest pain and shortness of breath.  The patient was seen by her primary care provider recently.  She reported dyspnea on exertion which has been a chronic issue.  She also reported chest pain with exertion.  Her dyspnea with exertion has been worse since her COVID infection last year.  She gets occasional chest tightness with activity that improves with rest.  She was noted to be still grieving over her mother's death as well as dealing with anxiety and depressive issues. ? ?The patient tells me that she will get chest pain when she exerts herself.  When she rests it would go away.  She will take a Tums when she gets the chest pain and will take about 20 or 30 minutes for it to abate.  She has been short of breath with exertion as well.  Shortness of breath has been worse since she contracted COVID.  She denies any peripheral edema, paroxysmal nocturnal dyspnea, orthopnea, presyncope or syncope.  She has had no severe bleeding episodes.  She is required no emergency room visits or hospitalizations. ? ? ? ?Current Medications: ?Current Meds  ?Medication Sig  ? Acetaminophen (TYLENOL EXTRA STRENGTH PO) Take by mouth. Take one prn  ? calcium carbonate (CALCIUM 600) 600 MG TABS tablet Take 1 tablet (600 mg total) by mouth daily  with breakfast.  ? cetirizine (ZYRTEC) 10 MG tablet TAKE 1 TABLET BY MOUTH EVERY DAY  ? Cholecalciferol (VITAMIN D) 50 MCG (2000 UT) CAPS 1 cap daily  ? citalopram (CELEXA) 20 MG tablet TAKE 1 TABLET BY MOUTH EVERY DAY  ? COLLAGEN PO Take by mouth.  ? ezetimibe (ZETIA) 10 MG tablet Take 1 tablet (10 mg total) by mouth daily.  ? furosemide (LASIX) 20 MG tablet Take 1 tablet (20 mg total) by mouth daily. (Patient taking differently: Take 20 mg by mouth as needed.)  ? isosorbide mononitrate (IMDUR) 30 MG 24 hr tablet Take 1 tablet (30 mg total) by mouth at bedtime.  ? Melatonin 10 MG CAPS Take by mouth as needed.  ? Multiple Vitamins-Minerals (WOMENS MULTI VITAMIN &  MINERAL PO) Take by mouth. Centrum Womens MVI-Take one daily  ? nitroGLYCERIN (NITROSTAT) 0.4 MG SL tablet Place 1 tablet (0.4 mg total) under the tongue every 5 (five) minutes as needed for chest pain.  ? TURMERIC PO Take by mouth.  ?  ? ?Allergies:    ?Latex, Penicillins, Ciprofloxacin, and Codeine  ? ?Social History:   ?Social History  ? ?Tobacco Use  ? Smoking status: Never  ? Smokeless tobacco: Never  ?Vaping Use  ? Vaping Use: Never used  ?Substance Use Topics  ? Alcohol use: No  ?  Alcohol/week: 0.0 standard drinks  ? Drug use: No  ?  ? ?Family Hx: ?Family History  ?Problem Relation Age of Onset  ? Other Mother   ?     Prediabetes  ? Colitis Mother   ? Pneumonia Father   ? Arthritis Sister   ? Heart attack Maternal Uncle 67  ? Heart attack Paternal Grandmother 41  ? Colon cancer Neg Hx   ?  ? ?Review of Systems:   ?Please see the history of present illness.    ?All other systems reviewed and are negative. ?  ? ? ?EKGs/Labs/Other Test Reviewed:   ? ?EKG: 2019 sinus rhythm; EKG today demonstrates sinus rhythm ? ?Prior CV studies: ?Echocardiogram 2023 with ejection fraction of 50 to 55% with normal diastolic function and no significant valvular abnormalities ? ? ? ?Imaging studies that I have independently reviewed today: Echo ? ?Recent Labs: ?12/16/2021: ALT 17; BUN 24; Creatinine, Ser 1.08; Hemoglobin 12.8; Platelets 318.0; Potassium 3.6; Sodium 137; TSH 2.22  ? ?Recent Lipid Panel ?Lab Results  ?Component Value Date/Time  ? CHOL 235 (H) 12/16/2021 02:40 PM  ? TRIG 317.0 (H) 12/16/2021 02:40 PM  ? HDL 41.20 12/16/2021 02:40 PM  ? [place]  05/16/2020 01:41 PM  ?   Comment:  ?   . ?LDL cholesterol not calculated. Triglyceride levels ?greater than 400 mg/dL invalidate calculated LDL results. ?. ?Reference range: <100 ?Marland Kitchen ?Desirable range <100 mg/dL for primary prevention;   ?<70 mg/dL for patients with CHD or diabetic patients  ?with > or = 2 CHD risk factors. ?. ?LDL-C is now calculated using the Martin-Hopkins   ?calculation, which is a validated novel method providing  ?better accuracy than the Friedewald equation in the  ?estimation of LDL-C.  ?Cresenciano Genre et al. Annamaria Helling. 5170;017(49): 2061-2068  ?(http://education.[employer].com/faq/FAQ164) ?  ? LDLDIRECT 149.0 12/16/2021 02:40 PM  ? ? ?Risk Assessment/Calculations:   ? ? ?    ? ?Physical Exam:   ? ?VS:  BP 130/68   Pulse 61   Ht '5\' 2"'$  (1.575 m)   Wt 170 lb 12.8 oz (77.5 kg)   SpO2 99%   BMI 31.24 kg/m?    ?  Wt Readings from Last 3 Encounters:  ?01/06/22 170 lb 12.8 oz (77.5 kg)  ?12/16/21 171 lb (77.6 kg)  ?11/13/21 174 lb (78.9 kg)  ?  ?GENERAL:  No apparent distress, AOx3 ?HEENT:  No carotid bruits, +2 carotid impulses, no scleral icterus ?CAR: RRR no murmurs, gallops, rubs, or thrills ?RES:  Clear to auscultation bilaterally ?ABD:  Soft, nontender, nondistended, positive bowel sounds x 4 ?VASC:  +2 radial pulses, +2 carotid pulses, palpable pedal pulses ?NEURO:  CN 2-12 grossly intact; motor and sensory grossly intact ?PSYCH:  No active depression or anxiety ?EXT:  No edema, ecchymosis, or cyanosis ? ?Signed, ?Early Osmond, MD  ?01/06/2022 1:42 PM    ?[facility] ?[address], [place], [place]  [address] ?Phone: [phone]; Fax: [phone]  ? ?Note:  This document was prepared using Dragon voice recognition software and may include unintentional dictation errors. ?

## 2022-01-06 ENCOUNTER — Encounter: Payer: Self-pay | Admitting: Internal Medicine

## 2022-01-06 ENCOUNTER — Ambulatory Visit: Payer: Medicare HMO | Admitting: Internal Medicine

## 2022-01-06 ENCOUNTER — Other Ambulatory Visit: Payer: Self-pay

## 2022-01-06 VITALS — BP 130/68 | HR 61 | Ht 62.0 in | Wt 170.8 lb

## 2022-01-06 DIAGNOSIS — R079 Chest pain, unspecified: Secondary | ICD-10-CM

## 2022-01-06 MED ORDER — NITROGLYCERIN 0.4 MG SL SUBL: 0.4000 mg | SUBLINGUAL_TABLET | SUBLINGUAL | 3 refills | Status: DC | PRN

## 2022-01-06 MED ORDER — ISOSORBIDE MONONITRATE ER 30 MG PO TB24: 30.0000 mg | ORAL_TABLET | Freq: Every day | ORAL | 3 refills | Status: DC

## 2022-01-06 MED ORDER — METOPROLOL TARTRATE 25 MG PO TABS: ORAL_TABLET | ORAL | 0 refills | Status: DC

## 2022-01-06 NOTE — Patient Instructions (Signed)
Medication Instructions:  ?1) START Imdur (Isosorbide) '30mg'$  once daily ?2)  A prescription has been sent in for Nitroglycerin.  If you have chest pain that doesn't relieve quickly, place one tablet under your tongue and allow it to dissolve.  If no relief after 5 minutes, you may take another pill.  If no relief after 5 minutes, you may take a 3rd dose but you need to call 911 and report to ER immediately. ? ?*If you need a refill on your cardiac medications before your next appointment, please call your pharmacy* ? ? ?Lab Work: ?BMET today ? ?If you have labs (blood work) drawn today and your tests are completely normal, you will receive your results only by: ?MyChart Message (if you have MyChart) OR ?A paper copy in the mail ?If you have any lab test that is abnormal or we need to change your treatment, we will call you to review the results. ? ? ?Testing/Procedures: ?Your physician recommends that you have a Coronary CT performed. ? ? ?Follow-Up: ?At [facility], you and your health needs are our priority.  As part of our continuing mission to provide you with exceptional heart care, we have created designated Provider Care Teams.  These Care Teams include your primary Cardiologist (physician) and Advanced Practice Providers (APPs -  Physician Assistants and Nurse Practitioners) who all work together to provide you with the care you need, when you need it. ? ?We recommend signing up for the patient portal called "MyChart".  Sign up information is provided on this After Visit Summary.  MyChart is used to connect with patients for Virtual Visits (Telemedicine).  Patients are able to view lab/test results, encounter notes, upcoming appointments, etc.  Non-urgent messages can be sent to your provider as well.   ?To learn more about what you can do with MyChart, go to [email or web address].   ? ?Your next appointment:   ?6 month(s) ? ?The format for your next appointment:   ?In Person ? ?Provider:   ?[name], MD  ? ? ?Other Instructions ? ? ? ?Your cardiac CT will be scheduled at one of the below locations:  ? ?[facility] ?[address] ?[place], [place] [address] ?[phone] ? ?OR ? ?[facility] [address] ?[address] ?[place], [place] [address] ?[phone] ? ?If scheduled at [facility], please arrive at the [facility] and Children's Entrance (Entrance C2) of [facility] 30 minutes prior to test start time. ?You can use the FREE valet parking offered at entrance C (encouraged to control the heart rate for the test)  ?Proceed to the [facility] Radiology Department (first floor) to check-in and test prep. ? ?All radiology patients and guests should use entrance C2 at [facility], accessed from [facility], even though the hospital's physical address listed is [address]. ? ? ? ?If scheduled at [facility], please arrive 15 mins early for check-in and test prep. ? ?Please follow these instructions carefully (unless otherwise directed): ? ?Hold all erectile dysfunction medications at least 3 days (72 hrs) prior to test. ? ?On the Night Before the Test: ?Be sure to Drink plenty of water. ?Do not consume any caffeinated/decaffeinated beverages or chocolate 12 hours prior to your test. ?Do not take any antihistamines 12 hours prior to your test. ? ?On the Day of the Test: ?Drink plenty of water until 1 hour prior to the test. ?Do not eat any food 4  hours prior to the test. ?You may take your regular medications prior to the test.  ?Take metoprolol (Lopressor) two hours prior to test. ?HOLD Furosemide/Hydrochlorothiazide morning of the test. ?FEMALES- please wear underwire-free bra if available, avoid dresses & tight clothing ? ?     ?After the Test: ?Drink plenty of water. ?After receiving IV contrast, you may experience a mild flushed feeling. This is normal. ?On occasion, you may  experience a mild rash up to 24 hours after the test. This is not dangerous. If this occurs, you can take Benadryl 25 mg and increase your fluid intake. ?If you experience trouble breathing, this can be serious. If it is severe call 911 IMMEDIATELY. If it is mild, please call our office. ?If you take any of these medications: Glipizide/Metformin, Avandament, Glucavance, please do not take 48 hours after completing test unless otherwise instructed. ? ?We will call to schedule your test 2-4 weeks out understanding that some insurance companies will need an authorization prior to the service being performed.  ? ?For non-scheduling related questions, please contact the cardiac imaging nurse navigator should you have any questions/concerns: ?Marchia Bond, Cardiac Imaging Nurse Navigator ?Gordy Clement, Cardiac Imaging Nurse Navigator ?[facility] ?Direct Office Dial: [phone]  ? ?For scheduling needs, including cancellations and rescheduling, please call [place], [phone].   ?

## 2022-01-07 LAB — BASIC METABOLIC PANEL
BUN/Creatinine Ratio: 24 (ref 12–28)
BUN: 24 mg/dL (ref 8–27)
CO2: 18 mmol/L — ABNORMAL LOW (ref 20–29)
Calcium: 9.8 mg/dL (ref 8.7–10.3)
Chloride: 104 mmol/L (ref 96–106)
Creatinine, Ser: 1.02 mg/dL — ABNORMAL HIGH (ref 0.57–1.00)
Glucose: 75 mg/dL (ref 70–99)
Potassium: 4.1 mmol/L (ref 3.5–5.2)
Sodium: 139 mmol/L (ref 134–144)
eGFR: 57 mL/min/{1.73_m2} — ABNORMAL LOW (ref >59)

## 2022-01-16 ENCOUNTER — Telehealth: Payer: Self-pay

## 2022-01-16 NOTE — Telephone Encounter (Signed)
? ?  Pre-operative Risk Assessment  ?  ?Patient Name: Pam Butler  ?DOB: 12-Nov-1945 ?MRN: [id number]  ? ?  ? ?Request for Surgical Clearance   ? ?Procedure:   Left middle ring and small finger A1 pulley release with tenosynovectomy and possible superficialis slip tenotomy and manipulation under anesthesia. Right wrist corticosteroid injection.  ? ?Date of Surgery:  Clearance TBD                              ?   ?Surgeon:  Dr. Roseanne Kaufman ?Surgeon's Group or Practice Name:  [facility] ?Phone number:  [phone] ?Fax number:  [phone] ?  ?Type of Clearance Requested:   ?- Medical  ?  ?Type of Anesthesia:  Not Indicated ?  ?Additional requests/questions:     ? ?Signed, ?Getsemani Lindon   ?01/16/2022, 10:04 AM  ? ?

## 2022-01-17 NOTE — Telephone Encounter (Signed)
? ?  Patient Name: Pam Butler  ?DOB: 07-05-1946 ?MRN: [id number] ? ?Primary Cardiologist: Early Osmond, MD ? ?Chart reviewed as part of pre-operative protocol coverage.  ? ?Patient is scheduled for coronary CTA on 01/27/2022 following recent visit with Dr. Ali Lowe for new chest pain.  ? ?Will await CT results before proceeding with surgical clearance.  ? ?[name], NP ?01/17/2022, 8:16 AM ? ? ?

## 2022-01-20 ENCOUNTER — Ambulatory Visit (HOSPITAL_COMMUNITY): Payer: Medicare HMO

## 2022-01-20 DIAGNOSIS — M65332 Trigger finger, left middle finger: Secondary | ICD-10-CM | POA: Diagnosis not present

## 2022-01-20 DIAGNOSIS — M65342 Trigger finger, left ring finger: Secondary | ICD-10-CM | POA: Diagnosis not present

## 2022-01-20 DIAGNOSIS — M65831 Other synovitis and tenosynovitis, right forearm: Secondary | ICD-10-CM | POA: Diagnosis not present

## 2022-01-20 DIAGNOSIS — M65842 Other synovitis and tenosynovitis, left hand: Secondary | ICD-10-CM | POA: Diagnosis not present

## 2022-01-20 DIAGNOSIS — M65352 Trigger finger, left little finger: Secondary | ICD-10-CM | POA: Diagnosis not present

## 2022-01-23 ENCOUNTER — Telehealth: Payer: Self-pay | Admitting: Pulmonary Disease

## 2022-01-23 NOTE — Telephone Encounter (Signed)
Fax received from Dr. Roseanne Kaufman to perform a Left middle, ring and small finger A1 pulley release with tenosynovectomy and possible superficialis slip tenotomy and manipulation under anesthesia on patient and right wrist corticosteroid injection.  Patient needs surgery clearance. Patient will be seen on 02/04/2022. Office protocol is a risk assessment can be sent to surgeon if patient has been seen in 60 days or less.  ? ?Sending to Dr. Loanne Drilling for risk assessment or recommendations if patient needs to be seen in office prior to surgical procedure.   ?

## 2022-01-24 ENCOUNTER — Telehealth (HOSPITAL_COMMUNITY): Payer: Self-pay | Admitting: Emergency Medicine

## 2022-01-24 NOTE — Telephone Encounter (Signed)
Attempted to call patient regarding upcoming cardiac CT appointment. °Left message on voicemail with name and callback number °Eual Lindstrom RN Navigator Cardiac Imaging °[facility] °[phone] Office °[phone] Cell ° °

## 2022-01-24 NOTE — Telephone Encounter (Signed)
Reaching out to patient to offer assistance regarding upcoming cardiac imaging study; pt verbalizes understanding of appt date/time, parking situation and where to check in, pre-test NPO status and medications ordered, and verified current allergies; name and call back number provided for further questions should they arise ?Marchia Bond RN Navigator Cardiac Imaging ?[facility] ?[phone] office ?[phone] cell ? ?Denies iv issues ?Arrival 1230 ?HR 61 (has metop just in case) ?

## 2022-01-24 NOTE — Telephone Encounter (Signed)
Noted. Will see as a new consult for pre-op evaluation. ?

## 2022-01-27 ENCOUNTER — Ambulatory Visit (HOSPITAL_COMMUNITY)
Admission: RE | Admit: 2022-01-27 | Discharge: 2022-01-27 | Disposition: A | Discharge status: Home / Self Care | Payer: Medicare HMO | Source: Ambulatory Visit | Attending: Internal Medicine | Admitting: Internal Medicine

## 2022-01-27 DIAGNOSIS — I2510 Atherosclerotic heart disease of native coronary artery without angina pectoris: Secondary | ICD-10-CM | POA: Insufficient documentation

## 2022-01-27 DIAGNOSIS — R079 Chest pain, unspecified: Secondary | ICD-10-CM | POA: Diagnosis not present

## 2022-01-27 DIAGNOSIS — Q2112 Patent foramen ovale: Secondary | ICD-10-CM | POA: Insufficient documentation

## 2022-01-27 DIAGNOSIS — I700 Atherosclerosis of aorta: Secondary | ICD-10-CM | POA: Diagnosis not present

## 2022-01-27 MED ORDER — NITROGLYCERIN 0.4 MG SL SUBL
0.8000 mg | SUBLINGUAL_TABLET | Freq: Once | SUBLINGUAL | Status: AC
  Administered 2022-01-27: 0.8 mg via SUBLINGUAL

## 2022-01-27 MED ORDER — IOHEXOL 350 MG/ML SOLN
100.0000 mL | Freq: Once | INTRAVENOUS | Status: AC | PRN
  Administered 2022-01-27: 100 mL via INTRAVENOUS

## 2022-01-27 MED ORDER — NITROGLYCERIN 0.4 MG SL SUBL
SUBLINGUAL_TABLET | SUBLINGUAL | Status: AC
  Filled 2022-01-27: qty 2

## 2022-01-28 ENCOUNTER — Telehealth: Payer: Self-pay | Admitting: *Deleted

## 2022-01-28 DIAGNOSIS — R079 Chest pain, unspecified: Secondary | ICD-10-CM

## 2022-01-28 DIAGNOSIS — E785 Hyperlipidemia, unspecified: Secondary | ICD-10-CM

## 2022-01-28 MED ORDER — ASPIRIN EC 81 MG PO TBEC: 81.0000 mg | DELAYED_RELEASE_TABLET | Freq: Every day | ORAL | 3 refills | Status: DC

## 2022-01-28 NOTE — Telephone Encounter (Signed)
? ?  Patient Name: Pam Butler  ?DOB: 06-Sep-1946 ?MRN: [id number] ? ?Primary Cardiologist: Early Osmond, MD ? ?Chart reviewed as part of pre-operative protocol coverage. Patient had recent OV with Dr. Ali Lowe for chest pain and was started on Imdur. 2D echo 11/2021 had shown low-normal EF 50-55% otherwise no significant abnormalities. She had coronary CTA 4/17 showing mild nonobstructive CAD, 60%ile CAC score, small PFO. Dr. Ali Lowe recommended to start ASA and atorvastatin and felt that if chest pain improved with Imdur/NTG she should see her PCP about GI eval since esophageal spasm is NTG responsive.   ? ?Given recent OV with chest pain and shortness of breath, will need to route to MD to inquire if he feels comfortable clearing patient for surgery as requested since testing reassuring and if she is OK to hold ASA pre-operatively since she's being started on this. Procedure requested is left middle ring and small finger A1 pulley release with tenosynovectomy and possible superficialis slip tenotomy and manipulation under anesthesia, also right wrist corticosteroid injection.  ? ?Dr. Ali Lowe - Please route response to P CV DIV PREOP (the pre-op pool). Thank you. ? ?Charlie Pitter, PA-C ?01/28/2022, 9:52 AM ? ? ?

## 2022-01-28 NOTE — Telephone Encounter (Signed)
? ?  Patient Name: Pam Butler  ?DOB: 1946-04-29 ?MRN: [id number] ? ?Primary Cardiologist: Early Osmond, MD ? ?Chart reviewed as part of pre-operative protocol coverage.  ?Per Dr. Ali Lowe, "She is at low risk for periop CV complication and can hold ASA as needed." (She had been recommended to start ASA '81mg'$  based on CT yesterday but patient may not yet be aware of results as it does not look like our team had gotten in touch with her yet.) ? ?Will route this bundled recommendation to requesting provider via Epic fax function. Please call with questions. ? ?Charlie Pitter, PA-C ?01/28/2022, 10:09 AM ? ? ?

## 2022-01-28 NOTE — Telephone Encounter (Signed)
-----   Message from Early Osmond, MD sent at 01/27/2022  2:53 PM EDT ----- ?Let her know CTA shows mild blockages that would not cause chest pain.  She needs to be on ASA 81 and start atorvastatin 40.  If Imdur/NTG are helping, then she needs to speak with PCP about GI eval since esophageal spasm is nitro responsive.  FLP and LFTs in 2 months, goal LDL < 70.  Her last lipid panel showed very high levels. ?

## 2022-01-28 NOTE — Telephone Encounter (Signed)
I spoke w the patient.  She is agreeable to start aspirin.  I sent referral for PharmD to review options for lipids.  She states she has tried Lipitor and Crestor in the past and they both made her have ache.  She is taking Zetia and recently tried to eat a healthier diet.  She is agreeable to come for a visit with lipid clinic to review options. ? ?Referral placed.  Results of CT forwarded to PCP and Pulmonary. ?

## 2022-02-04 ENCOUNTER — Encounter: Payer: Self-pay | Admitting: Pulmonary Disease

## 2022-02-04 ENCOUNTER — Ambulatory Visit: Payer: Medicare HMO | Admitting: Pulmonary Disease

## 2022-02-04 VITALS — BP 130/80 | HR 66 | Ht 62.0 in | Wt 172.4 lb

## 2022-02-04 DIAGNOSIS — U099 Post covid-19 condition, unspecified: Secondary | ICD-10-CM

## 2022-02-04 DIAGNOSIS — R0609 Other forms of dyspnea: Secondary | ICD-10-CM | POA: Diagnosis not present

## 2022-02-04 NOTE — Progress Notes (Signed)
? ? ?Subjective:  ? ?PATIENT ID: Pam Butler DOB: [birth year], MRN: [id number] ? ? ?HPI ? ?Chief Complaint  ?Patient presents with  ? Consult  ?  Doe since oct when she had covid  ? ? ?Reason for Visit: New consult for shortness of breath ? ?[name] is a 76 year old never smoker with allergic rhinitis, depression and anxiety who presents as consult for shortness of breath. ? ?She reports shortness of breath that began one year ago since her diagnosis of Sept 2022. Worse with walking or doing light work. Hx of pneumonia >12 years but no recent infections except for COVID (no hospitalization or oxygen requirement). Reports chronic cough, nonproductive. Denies wheezing. Able to walk flat surfaces but feels short of breath. She is not active at baseline. Previously participated in silver sneakers. However had to stop due to caring for her elderly mother x 6 years. She recently passed last October. ? ?She is planning for left middle, ring and small finger A1 pulley release with tenosynovectomy and possible superficialis slip tenotomy and manipulation under anesthesia. Needing pulmonary clearance before surgery. ? 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 ?I have personally reviewed patient's past medical/family/social history, allergies, current medications. ? ?Past Medical History:  ?Diagnosis Date  ? Allergy   ? Anxiety   ? Cataract   ? Bil  ? Depression   ? History of bloody stools   ? in past/  ? History of muscle pain   ? Bil pain in bil arms and shoulders/ chronic since last year/left shoulder started this year  ? Loose stools   ? on and off/occasional blood in stool  ?  ? ?Family History  ?Problem Relation Age of Onset  ? Other Mother   ?     Prediabetes  ? Colitis Mother   ? Pneumonia Father   ? Arthritis Sister   ? Heart attack Maternal Uncle 67  ? Heart attack Paternal Grandmother 50  ? Colon cancer Neg Hx   ?  ? ?Social History  ? ?Occupational History  ? Occupation:  retired  ?Tobacco Use  ? Smoking status: Never  ? Smokeless tobacco: Never  ?Vaping Use  ? Vaping Use: Never used  ?Substance and Sexual Activity  ? Alcohol use: No  ?  Alcohol/week: 0.0 standard drinks  ? Drug use: No  ? Sexual activity: Yes  ?  Birth control/protection: Surgical  ? ? ?Allergies  ?Allergen Reactions  ? Latex   ?  Causes swelling around face  ? Penicillins   ?  Hives  ? Ciprofloxacin   ?  Itching and hives  ? Codeine   ?  Itching(note: She has taken codeine containing cough preparations since)  ?  ? ?Outpatient Medications Prior to Visit  ?Medication Sig Dispense Refill  ? aspirin EC 81 MG tablet Take 1 tablet (81 mg total) by mouth daily. Swallow whole. 90 tablet 3  ? calcium carbonate (CALCIUM 600) 600 MG TABS tablet Take 1 tablet (600 mg total) by mouth daily with breakfast. 30 tablet   ? cetirizine (ZYRTEC) 10 MG tablet TAKE 1 TABLET BY MOUTH EVERY DAY 90 tablet 1  ? Cholecalciferol (VITAMIN D) 50 MCG (2000 UT) CAPS 1 cap daily 30 capsule 0  ? citalopram (CELEXA) 20 MG tablet TAKE 1 TABLET BY MOUTH EVERY DAY 90 tablet 4  ? COLLAGEN PO Take by mouth.    ? ezetimibe (ZETIA) 10 MG tablet Take 1 tablet (10 mg  total) by mouth daily. 90 tablet 3  ? Melatonin 10 MG CAPS Take by mouth as needed.    ? Multiple Vitamins-Minerals (WOMENS MULTI VITAMIN & MINERAL PO) Take by mouth. Centrum Womens MVI-Take one daily    ? TURMERIC PO Take by mouth.    ? Acetaminophen (TYLENOL EXTRA STRENGTH PO) Take by mouth. Take one prn    ? furosemide (LASIX) 20 MG tablet Take 1 tablet (20 mg total) by mouth daily. (Patient not taking: Reported on 02/04/2022) 30 tablet 5  ? isosorbide mononitrate (IMDUR) 30 MG 24 hr tablet Take 1 tablet (30 mg total) by mouth at bedtime. 90 tablet 3  ? metoprolol tartrate (LOPRESSOR) 25 MG tablet Take one tablet by mouth 2 hours prior to CT (Patient not taking: Reported on 02/04/2022) 1 tablet 0  ? nitroGLYCERIN (NITROSTAT) 0.4 MG SL tablet Place 1 tablet (0.4 mg total) under the tongue every  5 (five) minutes as needed for chest pain. (Patient not taking: Reported on 02/04/2022) 25 tablet 3  ? ?No facility-administered medications prior to visit.  ? ? ?Review of Systems  ?Constitutional:  Negative for chills, diaphoresis, fever, malaise/fatigue and weight loss.  ?HENT:  Positive for congestion.   ?Respiratory:  Positive for shortness of breath. Negative for cough, hemoptysis, sputum production and wheezing.   ?Cardiovascular:  Positive for chest pain. Negative for palpitations and leg swelling.  ?Gastrointestinal:  Positive for heartburn.  ?Musculoskeletal:  Positive for joint pain.  ?Psychiatric/Behavioral:  The patient is nervous/anxious.   ? ? ?Objective:  ? ?Vitals:  ? 02/04/22 1403  ?BP: 130/80  ?Pulse: 66  ?SpO2: 99%  ?Weight: 172 lb 6.4 oz (78.2 kg)  ?Height: '5\' 2"'$  (1.575 m)  ? ?SpO2: 99 % ?O2 Device: None (Room air) ? ?Physical Exam: ?General: Well-appearing, no acute distress ?HENT: [place], AT ?Eyes: EOMI, no scleral icterus ?Respiratory: Clear to auscultation bilaterally.  No crackles, wheezing or rales ?Cardiovascular: RRR, -M/R/G, no JVD ?Extremities: Left hand with 3rd to 5th finger extension  ?Neuro: AAO x4, CNII-XII grossly intact ?Psych: Normal mood, normal affect ? ?Data Reviewed: ? ?Imaging: ?CT coronary 4 mm nodular density is noted in the left lower lobe, best seen on image number 39 of series 11.  ? ?PFT: ?None on file ? ?Labs: ?CBC ?   ?Component Value Date/Time  ? WBC 9.4 12/16/2021 1440  ? RBC 4.23 12/16/2021 1440  ? HGB 12.8 12/16/2021 1440  ? HCT 37.0 12/16/2021 1440  ? PLT 318.0 12/16/2021 1440  ? MCV 87.6 12/16/2021 1440  ? MCH 31.0 07/03/2020 1331  ? MCHC 34.7 12/16/2021 1440  ? RDW 13.5 12/16/2021 1440  ? LYMPHSABS 3.0 12/16/2021 1440  ? MONOABS 0.8 12/16/2021 1440  ? EOSABS 0.4 12/16/2021 1440  ? BASOSABS 0.0 12/16/2021 1440  ? ?Absolute eos 12/16/21 - 400 ? ?Cardiac: ?Echo EF 50-55%. Normal diastolic parameters. RV size and function. Normal valves ?   ?Assessment & Plan:   ? ?Discussion: ?76 year old never smoker with allergic rhinitis, depression and anxiety who presents as consult for shortness of breath. Based time course of symptoms COVID 19 likely contributing to her dyspnea with deconditioning being the primary driver. CT reviewed with no stigmata of interstitial lung disease or post-infectious scarring. Low suspicion for obstructive lung disease. Echo with no suggestion of PH. Will evaluate with PFTs to rule out possible mild restrictive defect however no need to wait for testing to be completed to proceed with surgery.  ? ?COVID-19 long hauler ?--ORDER pulmonary  function tests ?--Encourage regular aerobic exercise 30 min daily. Recommend upper body exercises and aerobic exercise daily (walking, biking). ? ?Peri-operative Assessment of Pulmonary Risk for Non-Thoracic Surgery: ? ?For Ms. Faught, risk of perioperative pulmonary complications is increased by: ? [ X ]Age greater than 65 years ? [  ]COPD ? [  ]Serum albumin <3.5 ? [  ]Smoking ? [  ]Obstructive sleep apnea ? [  ]NYHA Class II Pulmonary Hypertension ? ?Respiratory complications generally occur in 1% of ASA Class I patients, 5% of ASA Class II and 10% of ASA Class III-IV patients These complications rarely result in mortality and include postoperative pneumonia, atelectasis, pulmonary embolism, ARDS and increased time requiring postoperative mechanical ventilation. ? ?Overall, I recommend proceeding with the surgery if the risk for respiratory complications are outweighed by the potential benefits. This will need to be discussed between the patient and surgeon. ? ?To reduce risks of respiratory complications, I recommend: ?--Pre- and post-operative incentive spirometry performed frequently while awake ?--Avoiding use of pancuronium during anesthesia. ? ?Health Maintenance ?Immunization History  ?Administered Date(s) Administered  ? Fluad Quad(high Dose 65+) 08/23/2019  ? Influenza Whole 07/06/2008, 07/19/2010  ?  Influenza, High Dose Seasonal PF 07/13/2018  ? Influenza,inj,Quad PF,6+ Mos 08/09/2016  ? PFIZER(Purple Top)SARS-COV-2 Vaccination 12/12/2019, 01/11/2020  ? Pneumococcal Conjugate-13 11/12/2016  ? Pneumococcal Polys

## 2022-02-04 NOTE — Telephone Encounter (Signed)
Patient has been scheduled for PFT and OV with Beth on 02/20/2022. Per Dr. Loanne Drilling patient can't be cleared for surgical procedure till PFT is completed. ?

## 2022-02-04 NOTE — Patient Instructions (Signed)
COVID-19 long hauler ?--ORDER pulmonary function tests ?--Encourage regular aerobic exercise 30 min daily. Recommend upper body exercises and aerobic exercise daily (walking, biking). ? ?Follow-up with me or NP in May with PFTs prior to visit. OK to schedule PFTs on separate days. ?

## 2022-02-04 NOTE — Telephone Encounter (Signed)
After reviewing patient's case, ok to clear patient for surgery. Please send/fax clinic note with pre-op evaluation. Please also notify patient of this update and ok to proceed with surgery. ? ?Keep follow-up with NP Volanda Napoleon for PFTs. ?

## 2022-02-05 ENCOUNTER — Ambulatory Visit: Payer: Medicare HMO | Admitting: Obstetrics and Gynecology

## 2022-02-05 ENCOUNTER — Encounter: Payer: Self-pay | Admitting: Obstetrics and Gynecology

## 2022-02-05 VITALS — BP 121/67 | HR 71

## 2022-02-05 DIAGNOSIS — N3281 Overactive bladder: Secondary | ICD-10-CM | POA: Diagnosis not present

## 2022-02-05 NOTE — Progress Notes (Signed)
[facility] ?Return Visit ? ?SUBJECTIVE  ?History of Present Illness: ?Pam Butler is a 76 y.o. female seen in follow-up for overactive bladder and bowel leakage.  ? ?Started the Gemtesa samples. Has less bladder urgency overall. Voids about every 1.5-2 hours. Feels she is able to get to the bathroom easier and has been sleeping for 6 hours without voiding.  ? ?Started increasing her fiber by eating an apple a day. Also using the fiber supplement. Now bowel movements are firmer most of the time.  ? ?Has a history of a sling (mesh). Denies leakage with cough/ sneeze.  ? ?Past Medical History: ?Patient  has a past medical history of Allergy, Anxiety, Cataract, Depression, History of bloody stools, History of muscle pain, and Loose stools.  ? ?Past Surgical History: ?She  has a past surgical history that includes Abdominal hysterectomy; Cholecystectomy; Tubal ligation; Breast biopsy (Right, 2001); Colonoscopy; and Appendectomy.  ? ?Medications: ?She has a current medication list which includes the following prescription(s): acetaminophen, aspirin ec, calcium carbonate, cetirizine, vitamin d, citalopram, collagen, ezetimibe, furosemide, isosorbide mononitrate, melatonin, metoprolol tartrate, multiple vitamins-minerals, nitroglycerin, and turmeric.  ? ?Allergies: ?Patient is allergic to latex, penicillins, ciprofloxacin, and codeine.  ? ?Social History: ?Patient  reports that she has never smoked. She has never used smokeless tobacco. She reports that she does not drink alcohol and does not use drugs.  ?  ?  ?OBJECTIVE  ?  ? ?Physical Exam: ?Vitals:  ? 02/05/22 1143  ?BP: 121/67  ?Pulse: 71  ? ? ?Gen: No apparent distress, A&O x 3. ? ?Detailed Urogynecologic Evaluation:  ?Deferred.   ? ?ASSESSMENT AND PLAN  ?  ?[name] is a 76 y.o. with:  ?1. Overactive bladder   ? ? ? ?OAB:  ?- Continue Gemtesa '75mg'$ . She will notify us if there is any issue with the prescription or if not covered.  ?- She is interested in  PTNS if the medication is not covered ? ?Diarrhea ?- has improved with increased fiber intake, still wants to do the referral to GI and will call them for an appt  ? ?Return 3 months ? ?Pam Folds, MD ? ?Time spent: I spent 20 minutes dedicated to the care of this patient on the date of this encounter to include pre-visit review of records, face-to-face time with the patient  and post visit documentation. ? ? ?

## 2022-02-10 NOTE — Telephone Encounter (Signed)
Has her pre-op been completed? ?

## 2022-02-13 NOTE — Telephone Encounter (Signed)
OV notes and clearance form have been faxed back to [facility]. Nothing further needed at this time. ?

## 2022-02-18 ENCOUNTER — Other Ambulatory Visit: Payer: Self-pay | Admitting: Internal Medicine

## 2022-02-18 DIAGNOSIS — M65342 Trigger finger, left ring finger: Secondary | ICD-10-CM | POA: Diagnosis not present

## 2022-02-18 DIAGNOSIS — M25832 Other specified joint disorders, left wrist: Secondary | ICD-10-CM | POA: Diagnosis not present

## 2022-02-18 DIAGNOSIS — M65832 Other synovitis and tenosynovitis, left forearm: Secondary | ICD-10-CM | POA: Diagnosis not present

## 2022-02-18 DIAGNOSIS — M65831 Other synovitis and tenosynovitis, right forearm: Secondary | ICD-10-CM | POA: Diagnosis not present

## 2022-02-18 DIAGNOSIS — M25831 Other specified joint disorders, right wrist: Secondary | ICD-10-CM | POA: Diagnosis not present

## 2022-02-18 DIAGNOSIS — M65352 Trigger finger, left little finger: Secondary | ICD-10-CM | POA: Diagnosis not present

## 2022-02-18 DIAGNOSIS — M65842 Other synovitis and tenosynovitis, left hand: Secondary | ICD-10-CM | POA: Diagnosis not present

## 2022-02-18 DIAGNOSIS — Z4789 Encounter for other orthopedic aftercare: Secondary | ICD-10-CM | POA: Insufficient documentation

## 2022-02-18 DIAGNOSIS — M65332 Trigger finger, left middle finger: Secondary | ICD-10-CM | POA: Diagnosis not present

## 2022-02-18 DIAGNOSIS — M24542 Contracture, left hand: Secondary | ICD-10-CM | POA: Diagnosis not present

## 2022-02-18 DIAGNOSIS — G8918 Other acute postprocedural pain: Secondary | ICD-10-CM | POA: Diagnosis not present

## 2022-02-18 DIAGNOSIS — Z0189 Encounter for other specified special examinations: Secondary | ICD-10-CM | POA: Diagnosis not present

## 2022-02-20 ENCOUNTER — Encounter: Payer: Self-pay | Admitting: Primary Care

## 2022-02-20 ENCOUNTER — Ambulatory Visit (INDEPENDENT_AMBULATORY_CARE_PROVIDER_SITE_OTHER): Payer: Medicare HMO | Admitting: Pulmonary Disease

## 2022-02-20 ENCOUNTER — Ambulatory Visit: Payer: Medicare HMO | Admitting: Primary Care

## 2022-02-20 VITALS — BP 138/70 | HR 63 | Temp 98.1°F | Ht 62.0 in | Wt 174.0 lb

## 2022-02-20 DIAGNOSIS — R0609 Other forms of dyspnea: Secondary | ICD-10-CM

## 2022-02-20 DIAGNOSIS — R0602 Shortness of breath: Secondary | ICD-10-CM | POA: Diagnosis not present

## 2022-02-20 DIAGNOSIS — U099 Post covid-19 condition, unspecified: Secondary | ICD-10-CM

## 2022-02-20 LAB — PULMONARY FUNCTION TEST
DL/VA % pred: 106 %
DL/VA: 4.8 ml/min/mmHg/L
DLCO cor % pred: 163 %
DLCO cor: 19.9 ml/min/mmHg
DLCO unc % pred: 160 %
DLCO unc: 19.52 ml/min/mmHg
FEF 25-75 Post: 2.2 L/sec
FEF 25-75 Pre: 1.83 L/sec
FEF2575-%Change-Post: 19 %
FEF2575-%Pred-Post: 223 %
FEF2575-%Pred-Pre: 186 %
FEV1-%Change-Post: 1 %
FEV1-%Pred-Post: 174 %
FEV1-%Pred-Pre: 171 %
FEV1-Post: 1.83 L
FEV1-Pre: 1.8 L
FEV1FVC-%Change-Post: 6 %
FEV1FVC-%Pred-Pre: 108 %
FEV6-%Change-Post: -4 %
FEV6-%Pred-Post: 158 %
FEV6-%Pred-Pre: 165 %
FEV6-Post: 2.12 L
FEV6-Pre: 2.22 L
FEV6FVC-%Pred-Post: 107 %
FEV6FVC-%Pred-Pre: 107 %
FVC-%Change-Post: -4 %
FVC-%Pred-Post: 146 %
FVC-%Pred-Pre: 153 %
FVC-Post: 2.12 L
FVC-Pre: 2.22 L
Post FEV1/FVC ratio: 86 %
Post FEV6/FVC ratio: 100 %
Pre FEV1/FVC ratio: 81 %
Pre FEV6/FVC Ratio: 100 %
RV % pred: 22 %
RV: 0.39 L
TLC % pred: 130 %
TLC: 4.26 L

## 2022-02-20 LAB — BRAIN NATRIURETIC PEPTIDE: Pro B Natriuretic peptide (BNP): 177 pg/mL — ABNORMAL HIGH (ref 0.0–100.0)

## 2022-02-20 NOTE — Assessment & Plan Note (Signed)
-   Pulmonary function testing showed normal spirometry, elevated diffusion capacity. No BD response. Unable to do FENO today. No chest tightness or wheezing. Dyspnea symptoms are not consistent with asthma. She has BLE edema. Checking BNP. Could consider trial low dose ICS/LABA at follow-up if symptoms do not improve.  ?

## 2022-02-20 NOTE — Progress Notes (Signed)
Performed Full PFT Today.   ?

## 2022-02-20 NOTE — Patient Instructions (Addendum)
Pulmonary function testing was normal, elevated diffusion capacity can be seen with asthma or heart failure (you have normal echocardiogram so I do not suspect this but I will check BNP lab. If elevated would recommend you see cardiologist) ? ?Orders: ?BNP today  ? ?Follow-up: ?3 months with Dr. Loanne Drilling  ?

## 2022-02-20 NOTE — Progress Notes (Signed)
? ?'@Patient'$  ID: Pam Butler, female    DOB: [birth year], 76 y.o.   MRN: [id number] ? ?Chief Complaint  ?Patient presents with  ? Follow-up  ?  Had PFT today-sob same  ? ? ?Referring provider: ?[name], MD ? ?HPI: ?76 year old female, never smoked. PMH allergic rhinitis, covid long hauler, chronic dyspnea. Patient of Dr. Loanne Drilling, seen for initial consult on 02/04/22.  ? ?02/20/2022 ?Patient presents today for follow-up with PFTs. Dyspnea started before she got covid in September 2022 but worsened after. She was fairly inactive during that time and feels this contributed to her shortness of breath. She is not significantly limited by her dyspnea symptoms. She is feeling some better recently. She has an occasional productive cough. She does have chronic BLE swelling, echocardiogram did not show diastolic dysfunction. No chest tightness or wheezing.  ? ?Pulmonary function testing ?02/20/2022 >> FVC 2.12 (146%), FEV1 1.83 (174%), ratio 86, TLC 130%, DLCOcor 19.90 (163%) ? ?Allergies  ?Allergen Reactions  ? Latex   ?  Causes swelling around face  ? Penicillins   ?  Hives  ? Ciprofloxacin   ?  Itching and hives  ? Codeine   ?  Itching(note: She has taken codeine containing cough preparations since)  ? ? ?Immunization History  ?Administered Date(s) Administered  ? Fluad Quad(high Dose 65+) 08/23/2019  ? Influenza Whole 07/06/2008, 07/19/2010  ? Influenza, High Dose Seasonal PF 07/13/2018  ? Influenza,inj,Quad PF,6+ Mos 08/09/2016  ? PFIZER(Purple Top)SARS-COV-2 Vaccination 12/12/2019, 01/11/2020  ? Pneumococcal Conjugate-13 11/12/2016  ? Pneumococcal Polysaccharide-23 01/11/2018  ? Td 06/30/2005  ? ? ?Past Medical History:  ?Diagnosis Date  ? Allergy   ? Anxiety   ? Cataract   ? Bil  ? Depression   ? History of bloody stools   ? in past/  ? History of muscle pain   ? Bil pain in bil arms and shoulders/ chronic since last year/left shoulder started this year  ? Loose stools   ? on and off/occasional blood in stool   ? ? ?Tobacco History: ?Social History  ? ?Tobacco Use  ?Smoking Status Never  ?Smokeless Tobacco Never  ? ?Counseling given: Not Answered ? ? ?Outpatient Medications Prior to Visit  ?Medication Sig Dispense Refill  ? Acetaminophen (TYLENOL EXTRA STRENGTH PO) Take by mouth. Take one prn    ? aspirin EC 81 MG tablet Take 1 tablet (81 mg total) by mouth daily. Swallow whole. 90 tablet 3  ? calcium carbonate (CALCIUM 600) 600 MG TABS tablet Take 1 tablet (600 mg total) by mouth daily with breakfast. 30 tablet   ? cetirizine (ZYRTEC) 10 MG tablet TAKE 1 TABLET BY MOUTH EVERY DAY 90 tablet 1  ? Cholecalciferol (VITAMIN D) 50 MCG (2000 UT) CAPS 1 cap daily 30 capsule 0  ? citalopram (CELEXA) 20 MG tablet TAKE 1 TABLET BY MOUTH EVERY DAY 90 tablet 4  ? COLLAGEN PO Take by mouth.    ? ezetimibe (ZETIA) 10 MG tablet Take 1 tablet (10 mg total) by mouth daily. 90 tablet 3  ? Melatonin 10 MG CAPS Take by mouth as needed.    ? Multiple Vitamins-Minerals (WOMENS MULTI VITAMIN & MINERAL PO) Take by mouth. Centrum Womens MVI-Take one daily    ? TURMERIC PO Take by mouth.    ? furosemide (LASIX) 20 MG tablet TAKE 1 TABLET BY MOUTH EVERY DAY (Patient not taking: Reported on 02/20/2022) 90 tablet 1  ? isosorbide mononitrate (IMDUR) 30 MG 24 hr tablet Take 1  tablet (30 mg total) by mouth at bedtime. (Patient not taking: Reported on 02/20/2022) 90 tablet 3  ? metoprolol tartrate (LOPRESSOR) 25 MG tablet Take one tablet by mouth 2 hours prior to CT (Patient not taking: Reported on 02/04/2022) 1 tablet 0  ? nitroGLYCERIN (NITROSTAT) 0.4 MG SL tablet Place 1 tablet (0.4 mg total) under the tongue every 5 (five) minutes as needed for chest pain. (Patient not taking: Reported on 02/04/2022) 25 tablet 3  ? ?No facility-administered medications prior to visit.  ? ? ? ? ?Review of Systems ? ?Review of Systems  ?Constitutional: Negative.   ? ? ?Physical Exam ? ?BP 138/70 (BP Location: Right Arm, Cuff Size: Normal)   Pulse 63   Temp 98.1 ?F (36.7  ?C) (Temporal)   Ht '5\' 2"'$  (1.575 m)   Wt 174 lb (78.9 kg)   SpO2 97%   BMI 31.83 kg/m?  ?Physical Exam ?Constitutional:   ?   Appearance: Normal appearance.  ?HENT:  ?   Head: Normocephalic and atraumatic.  ?Cardiovascular:  ?   Rate and Rhythm: Normal rate and regular rhythm.  ?Pulmonary:  ?   Effort: Pulmonary effort is normal.  ?   Breath sounds: Normal breath sounds.  ?Neurological:  ?   General: No focal deficit present.  ?   Mental Status: She is alert and oriented to person, place, and time. Mental status is at baseline.  ?Psychiatric:     ?   Mood and Affect: Mood normal.     ?   Behavior: Behavior normal.     ?   Thought Content: Thought content normal.     ?   Judgment: Judgment normal.  ?  ? ?Lab Results: ? ?CBC ?   ?Component Value Date/Time  ? WBC 9.4 12/16/2021 1440  ? RBC 4.23 12/16/2021 1440  ? HGB 12.8 12/16/2021 1440  ? HCT 37.0 12/16/2021 1440  ? PLT 318.0 12/16/2021 1440  ? MCV 87.6 12/16/2021 1440  ? MCH 31.0 07/03/2020 1331  ? MCHC 34.7 12/16/2021 1440  ? RDW 13.5 12/16/2021 1440  ? LYMPHSABS 3.0 12/16/2021 1440  ? MONOABS 0.8 12/16/2021 1440  ? EOSABS 0.4 12/16/2021 1440  ? BASOSABS 0.0 12/16/2021 1440  ? ? ?BMET ?   ?Component Value Date/Time  ? NA 139 01/06/2022 1352  ? K 4.1 01/06/2022 1352  ? CL 104 01/06/2022 1352  ? CO2 18 (L) 01/06/2022 1352  ? GLUCOSE 75 01/06/2022 1352  ? GLUCOSE 91 12/16/2021 1440  ? BUN 24 01/06/2022 1352  ? CREATININE 1.02 (H) 01/06/2022 1352  ? CREATININE 1.12 (H) 07/03/2020 1331  ? CALCIUM 9.8 01/06/2022 1352  ? GFRNONAA 63 08/09/2016 0921  ? GFRAA 72 08/09/2016 0921  ? ? ?BNP ?No results found for: BNP ? ?ProBNP ?No results found for: PROBNP ? ?Imaging: ?CT CORONARY MORPH W/CTA COR W/SCORE W/CA W/CM &/OR WO/CM ? ?Addendum Date: 01/27/2022   ?ADDENDUM REPORT: 01/27/2022 14:40 HISTORY: Chest pain, nonspecific EXAM: Cardiac/Coronary CT TECHNIQUE: The patient was scanned on a [employer]. PROTOCOL: A 120 kV prospective scan was triggered in the  descending thoracic aorta at 111 HU's. Axial non-contrast 3 mm slices were carried out through the heart. The data set was analyzed on a dedicated work station and scored using the Agatston method. Gantry rotation speed was 250 msecs and collimation was .6 mm. Heart rate was optimized medically and sl NTG was given. The 3D data set was reconstructed in 5% intervals of the 35-75 % of the  R-R cycle. Systolic and diastolic phases were analyzed on a dedicated work station using MPR, MIP and VRT modes. The patient received 143m OMNIPAQUE IOHEXOL 350 MG/ML SOLN of contrast. FINDINGS: Coronary calcium score: The patient's coronary artery calcium score is 113, which places the patient in the 60 percentile. Coronary arteries: Normal coronary origins.  Right dominance. Right Coronary Artery: Normal caliber vessel, gives rise to PDA. Predominantly noncalcified plaque in proximal RCA with 1-24% stenosis. Left Main Coronary Artery: Normal caliber vessel. No significant plaque or stenosis. Very small ramus intermedius. Left Anterior Descending Coronary Artery: Normal caliber vessel. Mixed calcified and noncalcified plaque throughout the proximal LAD with 25-49% stenosis. Gives rise to large/branching first diagonal branch. Left Circumflex Artery: Normal caliber vessel. Trivial calcified plaque with maximum 1-24% stenosis. Gives rise to 1 OM branch. Aorta: Normal size, 32 mm at the mid ascending aorta (level of the PA bifurcation) measured double oblique. Aortic atherosclerosis. No dissection seen in visualized portions of the aorta. Aortic Valve: No calcifications. Trileaflet. Other findings: Normal pulmonary vein drainage into the left atrium. Normal left atrial appendage without a thrombus. Normal size of the pulmonary artery. Normal appearance of the pericardium. Small PFO without clear evidence of shunting. IMPRESSION: 1.  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 2. Coronary calcium score of 113. This was 60th percentile for  age and sex matched control. 3. Normal coronary origin with right dominance. 4. Small PFO without clear evidence of shunting. INTERPRETATION: 1. CAD-RADS 0: No evidence of CAD (0%). Consider non-atherosclerotic causes of chest

## 2022-02-20 NOTE — Progress Notes (Deleted)
Patient ID: Pam Butler                 DOB: 21-Oct-1945                    MRN: [id number]     HPI: Pam Butler is a 76 y.o. female patient referred to lipid clinic by Dr Ali Lowe. PMH is significant for HLD, preDM, obesity, anxiety/depression, GERD, and IBS. First seen by cardiology 01/06/22 due to chest pain. Coronary CTA ordered and pt started on Imdur and prn NTG. Coronary artery calcium score was 113 (60th percentile) with noncalcified plaque in the prox RCA with 1-24% stenosis, mixed calcified and noncalcified plaque in the prox LAD with 25-49% stenosis, and 1-24% stenosis of LCx. She was advised to start aspirin '81mg'$  and atorvastatin '40mg'$  daily, however reported muscle aches on Lipitor and Crestor in the past and was referred to lipid clinic for follow up.  Reported issues on atorva and rosuva but only have prava and rosuva on pmh Clarify statin issues Update allergy list Praluent $47/141 preferred, no deductible and no grant Diet for TG, otherwise fenofibrate '145mg'$  daily ($5 1 mo or free at mail order 3 mo), vascepa is tier 4 $100/300, Repatha NF Needs 50% LDL lowering    Current Medications: ezetimibe '10mg'$  daily Intolerances: pravastatin '40mg'$  daily, rosuvastatin '5mg'$  3x/week Risk Factors: CAD, age, obesity LDL goal: '70mg'$ /dL  Diet:   Exercise:   Family History: Mother with preDM, maternal uncle and paternal grandmother with MI.   Social History: Denies tobacco, alcohol and drug use.  Labs: 12/16/21: TC 235, TG 316, HDL 41.2, LDL-D 149, nonHDL 193 (ezetimibe '10mg'$  daily), A1c 5.7%  Past Medical History:  Diagnosis Date   Allergy    Anxiety    Cataract    Bil   Depression    History of bloody stools    in past/   History of muscle pain    Bil pain in bil arms and shoulders/ chronic since last year/left shoulder started this year   Loose stools    on and off/occasional blood in stool    Current Outpatient Medications on File Prior to Visit  Medication Sig Dispense Refill    Acetaminophen (TYLENOL EXTRA STRENGTH PO) Take by mouth. Take one prn     aspirin EC 81 MG tablet Take 1 tablet (81 mg total) by mouth daily. Swallow whole. 90 tablet 3   calcium carbonate (CALCIUM 600) 600 MG TABS tablet Take 1 tablet (600 mg total) by mouth daily with breakfast. 30 tablet    cetirizine (ZYRTEC) 10 MG tablet TAKE 1 TABLET BY MOUTH EVERY DAY 90 tablet 1   Cholecalciferol (VITAMIN D) 50 MCG (2000 UT) CAPS 1 cap daily 30 capsule 0   citalopram (CELEXA) 20 MG tablet TAKE 1 TABLET BY MOUTH EVERY DAY 90 tablet 4   COLLAGEN PO Take by mouth.     ezetimibe (ZETIA) 10 MG tablet Take 1 tablet (10 mg total) by mouth daily. 90 tablet 3   furosemide (LASIX) 20 MG tablet TAKE 1 TABLET BY MOUTH EVERY DAY 90 tablet 1   isosorbide mononitrate (IMDUR) 30 MG 24 hr tablet Take 1 tablet (30 mg total) by mouth at bedtime. 90 tablet 3   Melatonin 10 MG CAPS Take by mouth as needed.     metoprolol tartrate (LOPRESSOR) 25 MG tablet Take one tablet by mouth 2 hours prior to CT (Patient not taking: Reported on 02/04/2022) 1 tablet 0  Multiple Vitamins-Minerals (WOMENS MULTI VITAMIN & MINERAL PO) Take by mouth. Centrum Womens MVI-Take one daily     nitroGLYCERIN (NITROSTAT) 0.4 MG SL tablet Place 1 tablet (0.4 mg total) under the tongue every 5 (five) minutes as needed for chest pain. (Patient not taking: Reported on 02/04/2022) 25 tablet 3   TURMERIC PO Take by mouth.     No current facility-administered medications on file prior to visit.    Allergies  Allergen Reactions   Latex     Causes swelling around face   Penicillins     Hives   Ciprofloxacin     Itching and hives   Codeine     Itching(note: She has taken codeine containing cough preparations since)    Assessment/Plan:  1. Hyperlipidemia -

## 2022-02-20 NOTE — Patient Instructions (Signed)
Performed Full PFT Today.   ?

## 2022-02-21 ENCOUNTER — Ambulatory Visit: Payer: Medicare HMO

## 2022-02-21 NOTE — Progress Notes (Signed)
BNP was elevated indicating that she is holding on to fluid, I would personally recommend she see cardiology d.t dyspnea and leg swelling. Can we refer her? Thanks

## 2022-02-24 ENCOUNTER — Telehealth: Payer: Self-pay | Admitting: Internal Medicine

## 2022-02-24 DIAGNOSIS — R0602 Shortness of breath: Secondary | ICD-10-CM

## 2022-02-24 NOTE — Telephone Encounter (Signed)
Patient had lab work with urogynecology and her BNP was elevated. She would like to have Dr. Ali Lowe review this. ?

## 2022-02-25 NOTE — Telephone Encounter (Signed)
Left message for patient to call back  

## 2022-02-25 NOTE — Telephone Encounter (Signed)
Since she has some dyspnea and swelling, let's start her on lasix 20 BID and check BMP in one week. ?

## 2022-02-26 DIAGNOSIS — H04123 Dry eye syndrome of bilateral lacrimal glands: Secondary | ICD-10-CM | POA: Diagnosis not present

## 2022-02-26 DIAGNOSIS — H1045 Other chronic allergic conjunctivitis: Secondary | ICD-10-CM | POA: Diagnosis not present

## 2022-02-26 DIAGNOSIS — H2513 Age-related nuclear cataract, bilateral: Secondary | ICD-10-CM | POA: Diagnosis not present

## 2022-02-27 DIAGNOSIS — M25642 Stiffness of left hand, not elsewhere classified: Secondary | ICD-10-CM | POA: Diagnosis not present

## 2022-02-27 NOTE — Telephone Encounter (Signed)
Reviewed recommendations with patient. She is going to try the lasix 20 mg BID.  She has not been taking it at all since its been prescribed because she didn't think she needed it.  She feels her legs have been the size they are for a long long time, the right leg has always been bigger than the left and to her it may just be normal and not swollen but she will try this for at least a few days to see if legs look smaller, her weight decreases and whether her breathing gets better.    She will come next Thursday for lab work.

## 2022-02-27 NOTE — Telephone Encounter (Signed)
Patient returning call.

## 2022-02-27 NOTE — Addendum Note (Signed)
Addended by: Rodman Key on: 02/27/2022 05:36 PM   Modules accepted: Orders

## 2022-03-03 DIAGNOSIS — M25642 Stiffness of left hand, not elsewhere classified: Secondary | ICD-10-CM | POA: Diagnosis not present

## 2022-03-05 DIAGNOSIS — M25642 Stiffness of left hand, not elsewhere classified: Secondary | ICD-10-CM | POA: Diagnosis not present

## 2022-03-06 ENCOUNTER — Other Ambulatory Visit: Payer: Medicare HMO

## 2022-03-06 DIAGNOSIS — R0602 Shortness of breath: Secondary | ICD-10-CM

## 2022-03-07 DIAGNOSIS — M25642 Stiffness of left hand, not elsewhere classified: Secondary | ICD-10-CM | POA: Diagnosis not present

## 2022-03-07 LAB — BASIC METABOLIC PANEL
BUN/Creatinine Ratio: 22 (ref 12–28)
BUN: 24 mg/dL (ref 8–27)
CO2: 19 mmol/L — ABNORMAL LOW (ref 20–29)
Calcium: 9.3 mg/dL (ref 8.7–10.3)
Chloride: 105 mmol/L (ref 96–106)
Creatinine, Ser: 1.11 mg/dL — ABNORMAL HIGH (ref 0.57–1.00)
Glucose: 89 mg/dL (ref 70–99)
Potassium: 4.2 mmol/L (ref 3.5–5.2)
Sodium: 140 mmol/L (ref 134–144)
eGFR: 52 mL/min/{1.73_m2} — ABNORMAL LOW (ref >59)

## 2022-03-11 DIAGNOSIS — M25642 Stiffness of left hand, not elsewhere classified: Secondary | ICD-10-CM | POA: Diagnosis not present

## 2022-03-14 DIAGNOSIS — M25642 Stiffness of left hand, not elsewhere classified: Secondary | ICD-10-CM | POA: Diagnosis not present

## 2022-03-19 DIAGNOSIS — M25642 Stiffness of left hand, not elsewhere classified: Secondary | ICD-10-CM | POA: Diagnosis not present

## 2022-03-21 DIAGNOSIS — M25642 Stiffness of left hand, not elsewhere classified: Secondary | ICD-10-CM | POA: Diagnosis not present

## 2022-03-24 DIAGNOSIS — M25642 Stiffness of left hand, not elsewhere classified: Secondary | ICD-10-CM | POA: Diagnosis not present

## 2022-03-26 DIAGNOSIS — M25642 Stiffness of left hand, not elsewhere classified: Secondary | ICD-10-CM | POA: Diagnosis not present

## 2022-04-04 DIAGNOSIS — M25642 Stiffness of left hand, not elsewhere classified: Secondary | ICD-10-CM | POA: Diagnosis not present

## 2022-04-08 ENCOUNTER — Ambulatory Visit: Payer: Medicare HMO | Admitting: Gastroenterology

## 2022-04-08 DIAGNOSIS — M25642 Stiffness of left hand, not elsewhere classified: Secondary | ICD-10-CM | POA: Diagnosis not present

## 2022-04-11 DIAGNOSIS — M25642 Stiffness of left hand, not elsewhere classified: Secondary | ICD-10-CM | POA: Diagnosis not present

## 2022-04-17 DIAGNOSIS — M25642 Stiffness of left hand, not elsewhere classified: Secondary | ICD-10-CM | POA: Diagnosis not present

## 2022-04-22 DIAGNOSIS — M25642 Stiffness of left hand, not elsewhere classified: Secondary | ICD-10-CM | POA: Diagnosis not present

## 2022-04-25 DIAGNOSIS — M25642 Stiffness of left hand, not elsewhere classified: Secondary | ICD-10-CM | POA: Diagnosis not present

## 2022-04-29 DIAGNOSIS — M25642 Stiffness of left hand, not elsewhere classified: Secondary | ICD-10-CM | POA: Diagnosis not present

## 2022-05-02 DIAGNOSIS — M25642 Stiffness of left hand, not elsewhere classified: Secondary | ICD-10-CM | POA: Diagnosis not present

## 2022-05-05 DIAGNOSIS — Z4789 Encounter for other orthopedic aftercare: Secondary | ICD-10-CM | POA: Diagnosis not present

## 2022-05-05 DIAGNOSIS — M65352 Trigger finger, left little finger: Secondary | ICD-10-CM | POA: Diagnosis not present

## 2022-05-05 DIAGNOSIS — M79642 Pain in left hand: Secondary | ICD-10-CM | POA: Insufficient documentation

## 2022-05-05 DIAGNOSIS — M65342 Trigger finger, left ring finger: Secondary | ICD-10-CM | POA: Diagnosis not present

## 2022-05-05 DIAGNOSIS — M25642 Stiffness of left hand, not elsewhere classified: Secondary | ICD-10-CM | POA: Diagnosis not present

## 2022-05-05 DIAGNOSIS — M65332 Trigger finger, left middle finger: Secondary | ICD-10-CM | POA: Diagnosis not present

## 2022-05-07 ENCOUNTER — Ambulatory Visit: Payer: Medicare HMO | Admitting: Obstetrics and Gynecology

## 2022-05-07 ENCOUNTER — Encounter: Payer: Self-pay | Admitting: Obstetrics and Gynecology

## 2022-05-07 VITALS — BP 106/70 | HR 95

## 2022-05-07 DIAGNOSIS — N3281 Overactive bladder: Secondary | ICD-10-CM

## 2022-05-07 NOTE — Progress Notes (Signed)
Pam Butler Return Visit  SUBJECTIVE  History of Present Illness: Pam Butler is a 76 y.o. female seen in follow-up for overactive bladder and bowel leakage.   [place] Overall has been working well and has been able to go 3-4 hours without urinating. But started leaking more often recently, so has been trying to go more frequently. The urgency has improved significantly. Nighttime symptoms have improved, cant go 6-7hrs without waking up.   She is still drinking 2 cups coffee per day and water with small amount of pepsi in it to flavor. Not drinking tea anymore.   Bowel movements have been more regular.   Has a history of a sling (mesh). Denies leakage with cough/ sneeze.   Past Medical History: Patient  has a past medical history of Allergy, Anxiety, Cataract, Depression, History of bloody stools, History of muscle pain, and Loose stools.   Past Surgical History: She  has a past surgical history that includes Abdominal hysterectomy; Cholecystectomy; Tubal ligation; Breast biopsy (Right, 2001); Colonoscopy; and Appendectomy.   Medications: She has a current medication list which includes the following prescription(s): acetaminophen, aspirin ec, calcium carbonate, cetirizine, vitamin d, citalopram, collagen, ezetimibe, furosemide, isosorbide mononitrate, melatonin, metoprolol tartrate, multiple vitamins-minerals, nitroglycerin, and turmeric.   Allergies: Patient is allergic to latex, penicillins, ciprofloxacin, and codeine.   Social History: Patient  reports that she has never smoked. She has never used smokeless tobacco. She reports that she does not drink alcohol and does not use drugs.      OBJECTIVE     Physical Exam: Vitals:   05/07/22 1304  BP: 106/70  Pulse: 95    Gen: No apparent distress, A&O x 3.  Detailed Urogynecologic Evaluation:  Deferred.    ASSESSMENT AND PLAN    Pam Butler is a 76 y.o. with:  1. Overactive bladder    - Will continue  Gemtesa '77mg'$ . She has also been thinking about tibial nerve stimulation and may decide to do that since the medication costs her $100/ month. But she has at least 2 months of the medication left so will continue for now.  - Discussed reducing intake of coffee and soda to help improve bladder urgency.   Follow up 1 year or sooner if needed  [name], MD  Time spent: I spent 25 minutes dedicated to the care of this patient on the date of this encounter to include pre-visit review of records, face-to-face time with the patient  and post visit documentation.

## 2022-05-09 DIAGNOSIS — M25642 Stiffness of left hand, not elsewhere classified: Secondary | ICD-10-CM | POA: Diagnosis not present

## 2022-05-14 DIAGNOSIS — M25642 Stiffness of left hand, not elsewhere classified: Secondary | ICD-10-CM | POA: Diagnosis not present

## 2022-05-16 DIAGNOSIS — M25642 Stiffness of left hand, not elsewhere classified: Secondary | ICD-10-CM | POA: Diagnosis not present

## 2022-05-20 DIAGNOSIS — M25642 Stiffness of left hand, not elsewhere classified: Secondary | ICD-10-CM | POA: Diagnosis not present

## 2022-05-27 ENCOUNTER — Encounter: Payer: Medicare HMO | Admitting: Internal Medicine

## 2022-06-02 ENCOUNTER — Ambulatory Visit: Payer: Medicare HMO | Admitting: Pulmonary Disease

## 2022-06-02 ENCOUNTER — Encounter: Payer: Self-pay | Admitting: Internal Medicine

## 2022-06-02 DIAGNOSIS — M25642 Stiffness of left hand, not elsewhere classified: Secondary | ICD-10-CM | POA: Diagnosis not present

## 2022-06-02 DIAGNOSIS — N183 Chronic kidney disease, stage 3 unspecified: Secondary | ICD-10-CM | POA: Insufficient documentation

## 2022-06-02 DIAGNOSIS — N1831 Chronic kidney disease, stage 3a: Secondary | ICD-10-CM | POA: Insufficient documentation

## 2022-06-02 NOTE — Patient Instructions (Addendum)
Blood work was ordered.     Medications changes include :      Your prescription(s) have been sent to your pharmacy.    A referral was ordered for XX.     Someone from that office will call you to schedule an appointment.    Return in about 6 months (around 12/04/2022) for follow up.    Health Maintenance, Female Adopting a healthy lifestyle and getting preventive care are important in promoting health and wellness. Ask your health care provider about: The right schedule for you to have regular tests and exams. Things you can do on your own to prevent diseases and keep yourself healthy. What should I know about diet, weight, and exercise? Eat a healthy diet  Eat a diet that includes plenty of vegetables, fruits, low-fat dairy products, and lean protein. Do not eat a lot of foods that are high in solid fats, added sugars, or sodium. Maintain a healthy weight Body mass index (BMI) is used to identify weight problems. It estimates body fat based on height and weight. Your health care provider can help determine your BMI and help you achieve or maintain a healthy weight. Get regular exercise Get regular exercise. This is one of the most important things you can do for your health. Most adults should: Exercise for at least 150 minutes each week. The exercise should increase your heart rate and make you sweat (moderate-intensity exercise). Do strengthening exercises at least twice a week. This is in addition to the moderate-intensity exercise. Spend less time sitting. Even light physical activity can be beneficial. Watch cholesterol and blood lipids Have your blood tested for lipids and cholesterol at 77 years of age, then have this test every 5 years. Have your cholesterol levels checked more often if: Your lipid or cholesterol levels are high. You are older than 77 years of age. You are at high risk for heart disease. What should I know about cancer screening? Depending on  your health history and family history, you may need to have cancer screening at various ages. This may include screening for: Breast cancer. Cervical cancer. Colorectal cancer. Skin cancer. Lung cancer. What should I know about heart disease, diabetes, and high blood pressure? Blood pressure and heart disease High blood pressure causes heart disease and increases the risk of stroke. This is more likely to develop in people who have high blood pressure readings or are overweight. Have your blood pressure checked: Every 3-5 years if you are 81-42 years of age. Every year if you are 77 years old or older. Diabetes Have regular diabetes screenings. This checks your fasting blood sugar level. Have the screening done: Once every three years after age 58 if you are at a normal weight and have a low risk for diabetes. More often and at a younger age if you are overweight or have a high risk for diabetes. What should I know about preventing infection? Hepatitis B If you have a higher risk for hepatitis B, you should be screened for this virus. Talk with your health care provider to find out if you are at risk for hepatitis B infection. Hepatitis C Testing is recommended for: Everyone born from [birth year] through 1965. Anyone with known risk factors for hepatitis C. Sexually transmitted infections (STIs) Get screened for STIs, including gonorrhea and chlamydia, if: You are sexually active and are younger than 77 years of age. You are older than 77 years of age and your health care provider tells  you that you are at risk for this type of infection. Your sexual activity has changed since you were last screened, and you are at increased risk for chlamydia or gonorrhea. Ask your health care provider if you are at risk. Ask your health care provider about whether you are at high risk for HIV. Your health care provider may recommend a prescription medicine to help prevent HIV infection. If you choose to take  medicine to prevent HIV, you should first get tested for HIV. You should then be tested every 3 months for as long as you are taking the medicine. Pregnancy If you are about to stop having your period (premenopausal) and you may become pregnant, seek counseling before you get pregnant. Take 400 to 800 micrograms (mcg) of folic acid every day if you become pregnant. Ask for birth control (contraception) if you want to prevent pregnancy. Osteoporosis and menopause Osteoporosis is a disease in which the bones lose minerals and strength with aging. This can result in bone fractures. If you are 58 years old or older, or if you are at risk for osteoporosis and fractures, ask your health care provider if you should: Be screened for bone loss. Take a calcium or vitamin D supplement to lower your risk of fractures. Be given hormone replacement therapy (HRT) to treat symptoms of menopause. Follow these instructions at home: Alcohol use Do not drink alcohol if: Your health care provider tells you not to drink. You are pregnant, may be pregnant, or are planning to become pregnant. If you drink alcohol: Limit how much you have to: 0-1 drink a day. Know how much alcohol is in your drink. In the [place] one drink equals one 12 oz bottle of beer (355 mL), one 5 oz glass of wine (148 mL), or one 1 oz glass of hard liquor (44 mL). Lifestyle Do not use any products that contain nicotine or tobacco. These products include cigarettes, chewing tobacco, and vaping devices, such as e-cigarettes. If you need help quitting, ask your health care provider. Do not use street drugs. Do not share needles. Ask your health care provider for help if you need support or information about quitting drugs. General instructions Schedule regular health, dental, and eye exams. Stay current with your vaccines. Tell your health care provider if: You often feel depressed. You have ever been abused or do not feel safe at  home. Summary Adopting a healthy lifestyle and getting preventive care are important in promoting health and wellness. Follow your health care provider's instructions about healthy diet, exercising, and getting tested or screened for diseases. Follow your health care provider's instructions on monitoring your cholesterol and blood pressure. This information is not intended to replace advice given to you by your health care provider. Make sure you discuss any questions you have with your health care provider. Document Revised: 02/18/2021 Document Reviewed: 02/18/2021 Elsevier Patient Education  [place].

## 2022-06-02 NOTE — Progress Notes (Signed)
Subjective:    Patient ID: Pam Butler, female    DOB: 1946/09/13, 76 y.o.   MRN: [id number]      HPI Pam Butler is here for a Physical exam.    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  Current Outpatient Medications on File Prior to Visit  Medication Sig Dispense Refill  . Acetaminophen (TYLENOL EXTRA STRENGTH PO) Take by mouth. Take one prn    . aspirin EC 81 MG tablet Take 1 tablet (81 mg total) by mouth daily. Swallow whole. 90 tablet 3  . calcium carbonate (CALCIUM 600) 600 MG TABS tablet Take 1 tablet (600 mg total) by mouth daily with breakfast. 30 tablet   . Cholecalciferol (VITAMIN D) 50 MCG (2000 UT) CAPS 1 cap daily 30 capsule 0  . COLLAGEN PO Take by mouth.    . Melatonin 10 MG CAPS Take by mouth as needed.    . Multiple Vitamins-Minerals (WOMENS MULTI VITAMIN & MINERAL PO) Take by mouth. Centrum Womens MVI-Take one daily    . TURMERIC PO Take by mouth.     No current facility-administered medications on file prior to visit.    Review of Systems     Objective:  There were no vitals filed for this visit. There were no vitals filed for this visit. There is no height or weight on file to calculate BMI.  BP Readings from Last 3 Encounters:  06/17/22 124/82  05/07/22 106/70  02/20/22 138/70    Wt Readings from Last 3 Encounters:  06/17/22 171 lb (77.6 kg)  02/20/22 174 lb (78.9 kg)  02/04/22 172 lb 6.4 oz (78.2 kg)       Physical Exam Constitutional: She appears well-developed and well-nourished. No distress.  HENT:  Head: Normocephalic and atraumatic.  Right Ear: External ear normal. Normal ear canal and TM Left Ear: External ear normal.  Normal ear canal and TM Mouth/Throat: Oropharynx is clear and moist.  Eyes: Conjunctivae normal.  Neck: Neck supple. No tracheal deviation present. No thyromegaly present.  No carotid bruit  Cardiovascular: Normal rate, regular rhythm and normal heart sounds.   No  murmur heard.  No edema. Pulmonary/Chest: Effort normal and breath sounds normal. No respiratory distress. She has no wheezes. She has no rales.  Breast: deferred   Abdominal: Soft. She exhibits no distension. There is no tenderness.  Lymphadenopathy: She has no cervical adenopathy.  Skin: Skin is warm and dry. She is not diaphoretic.  Psychiatric: She has a normal mood and affect. Her behavior is normal.     Lab Results  Component Value Date   WBC 9.4 12/16/2021   HGB 12.8 12/16/2021   HCT 37.0 12/16/2021   PLT 318.0 12/16/2021   GLUCOSE 89 03/06/2022   CHOL 235 (H) 12/16/2021   TRIG 317.0 (H) 12/16/2021   HDL 41.20 12/16/2021   LDLDIRECT 149.0 12/16/2021   [place]  05/16/2020     Comment:     . LDL cholesterol not calculated. Triglyceride levels greater than 400 mg/dL invalidate calculated LDL results. . Reference range: <100 . Desirable range <100 mg/dL for primary prevention;   <70 mg/dL for patients with CHD or diabetic patients  with > or = 2 CHD risk factors. Marland Kitchen LDL-C is now calculated using the Martin-Hopkins  calculation, which is a validated novel method providing  better accuracy than the Friedewald equation in the  estimation of LDL-C.  Cresenciano Genre et al. Annamaria Helling. 3536;144(31): 2061-2068  (http://education.[facility].com/faq/FAQ164)  ALT 17 12/16/2021   AST 21 12/16/2021   NA 140 03/06/2022   K 4.2 03/06/2022   CL 105 03/06/2022   CREATININE 1.11 (H) 03/06/2022   BUN 24 03/06/2022   CO2 19 (L) 03/06/2022   TSH 2.22 12/16/2021   HGBA1C 5.7 12/16/2021         Assessment & Plan:   Physical exam: Screening blood work  ordered Exercise   Weight   Substance abuse  none   Reviewed recommended immunizations.   Health Maintenance  Topic Date Due  . COVID-19 Vaccine (3 - Pfizer series) 03/07/2020  . DEXA SCAN  02/17/2021  . Zoster Vaccines- Shingrix (1 of 2) 09/16/2022 (Originally 03/18/1995)  . INFLUENZA VACCINE  01/11/2023 (Originally  05/13/2022)  . TETANUS/TDAP  06/18/2023 (Originally 07/01/2015)  . COLONOSCOPY (Pts 45-45yr Insurance coverage will need to be confirmed)  03/23/2024  . Pneumonia Vaccine 77 Years old  Completed  . Hepatitis C Screening  Completed  . HPV VACCINES  Aged Out          See Problem List for Assessment and Plan of chronic medical problems.     This encounter was created in error - please disregard.

## 2022-06-03 ENCOUNTER — Encounter: Payer: Medicare HMO | Admitting: Internal Medicine

## 2022-06-03 DIAGNOSIS — F419 Anxiety disorder, unspecified: Secondary | ICD-10-CM

## 2022-06-03 DIAGNOSIS — R7303 Prediabetes: Secondary | ICD-10-CM

## 2022-06-03 DIAGNOSIS — F3289 Other specified depressive episodes: Secondary | ICD-10-CM

## 2022-06-03 DIAGNOSIS — Z0000 Encounter for general adult medical examination without abnormal findings: Secondary | ICD-10-CM

## 2022-06-03 DIAGNOSIS — E559 Vitamin D deficiency, unspecified: Secondary | ICD-10-CM

## 2022-06-03 DIAGNOSIS — M85851 Other specified disorders of bone density and structure, right thigh: Secondary | ICD-10-CM

## 2022-06-03 DIAGNOSIS — E7849 Other hyperlipidemia: Secondary | ICD-10-CM

## 2022-06-03 DIAGNOSIS — R600 Localized edema: Secondary | ICD-10-CM

## 2022-06-03 DIAGNOSIS — N1831 Chronic kidney disease, stage 3a: Secondary | ICD-10-CM

## 2022-06-05 DIAGNOSIS — M25642 Stiffness of left hand, not elsewhere classified: Secondary | ICD-10-CM | POA: Diagnosis not present

## 2022-06-10 DIAGNOSIS — M25642 Stiffness of left hand, not elsewhere classified: Secondary | ICD-10-CM | POA: Diagnosis not present

## 2022-06-13 DIAGNOSIS — M25642 Stiffness of left hand, not elsewhere classified: Secondary | ICD-10-CM | POA: Diagnosis not present

## 2022-06-14 ENCOUNTER — Other Ambulatory Visit: Payer: Self-pay | Admitting: Internal Medicine

## 2022-06-16 ENCOUNTER — Encounter: Payer: Self-pay | Admitting: Internal Medicine

## 2022-06-16 NOTE — Patient Instructions (Addendum)
[facility] for Pam Butler   Blood work was ordered.     Medications changes include :   none    A [facility] density test [facility].     Return in about 6 months (around 12/16/2022) for follow up.   Health Maintenance, Female Adopting a healthy lifestyle and getting preventive care are important in promoting health and wellness. Ask your health care provider about: The right schedule for you to have regular tests and exams. Things you can do on your own to prevent diseases and keep yourself healthy. What should I know about diet, weight, and exercise? Eat a healthy diet  Eat a diet that includes plenty of vegetables, fruits, low-fat dairy products, and lean protein. Do not eat a lot of foods that are high in solid fats, added sugars, or sodium. Maintain a healthy weight Body mass index (BMI) is used to identify weight problems. It estimates body fat based on height and weight. Your health care provider can help determine your BMI and help you achieve or maintain a healthy weight. Get regular exercise Get regular exercise. This is one of the most important things you can do for your health. Most adults should: Exercise for at least 150 minutes each week. The exercise should increase your heart rate and make you sweat (moderate-intensity exercise). Do strengthening exercises at least twice a week. This is in addition to the moderate-intensity exercise. Spend less time sitting. Even light physical activity can be beneficial. Watch cholesterol and blood lipids Have your blood tested for lipids and cholesterol at 77 years of age, then have this test every 5 years. Have your cholesterol levels checked more often if: Your lipid or cholesterol levels are high. You are older than 77 years of age. You are at high risk for heart disease. What should I know about cancer screening? Depending on your health history and family history, you may need to have cancer  screening at various ages. This may include screening for: Breast cancer. Cervical cancer. Colorectal cancer. Skin cancer. Lung cancer. What should I know about heart disease, diabetes, and high blood pressure? Blood pressure and heart disease High blood pressure causes heart disease and increases the risk of stroke. This is more likely to develop in people who have high blood pressure readings or are overweight. Have your blood pressure checked: Every 3-5 years if you are 102-65 years of age. Every year if you are 72 years old or older. Diabetes Have regular diabetes screenings. This checks your fasting blood sugar level. Have the screening done: Once every three years after age 8 if you are at a normal weight and have a low risk for diabetes. More often and at a younger age if you are overweight or have a high risk for diabetes. What should I know about preventing infection? Hepatitis B If you have a higher risk for hepatitis B, you should be screened for this virus. Talk with your health care provider to find out if you are at risk for hepatitis B infection. Hepatitis C Testing is recommended for: Everyone born from [birth year] through 1965. Anyone with known risk factors for hepatitis C. Sexually transmitted infections (STIs) Get screened for STIs, including gonorrhea and chlamydia, if: You are sexually active and are younger than 77 years of age. You are older than 77 years of age and your health care provider tells you that you are at risk for this type of infection. Your sexual activity has changed  since you were last screened, and you are at increased risk for chlamydia or gonorrhea. Ask your health care provider if you are at risk. Ask your health care provider about whether you are at high risk for HIV. Your health care provider may recommend a prescription medicine to help prevent HIV infection. If you choose to take medicine to prevent HIV, you should first get tested for HIV. You  should then be tested every 3 months for as long as you are taking the medicine. Pregnancy If you are about to stop having your period (premenopausal) and you may become pregnant, seek counseling before you get pregnant. Take 400 to 800 micrograms (mcg) of folic acid every day if you become pregnant. Ask for birth control (contraception) if you want to prevent pregnancy. Osteoporosis and menopause Osteoporosis is a disease in which the bones lose minerals and strength with aging. This can result in bone fractures. If you are 7 years old or older, or if you are at risk for osteoporosis and fractures, ask your health care provider if you should: Be screened for bone loss. Take a calcium or vitamin D supplement to lower your risk of fractures. Be given hormone replacement therapy (HRT) to treat symptoms of menopause. Follow these instructions at home: Alcohol use Do not drink alcohol if: Your health care provider tells you not to drink. You are pregnant, may be pregnant, or are planning to become pregnant. If you drink alcohol: Limit how much you have to: 0-1 drink a day. Know how much alcohol is in your drink. In the [place] one drink equals one 12 oz bottle of beer (355 mL), one 5 oz glass of wine (148 mL), or one 1 oz glass of hard liquor (44 mL). Lifestyle Do not use any products that contain nicotine or tobacco. These products include cigarettes, chewing tobacco, and vaping devices, such as e-cigarettes. If you need help quitting, ask your health care provider. Do not use street drugs. Do not share needles. Ask your health care provider for help if you need support or information about quitting drugs. General instructions Schedule regular health, dental, and eye exams. Stay current with your vaccines. Tell your health care provider if: You often feel depressed. You have ever been abused or do not feel safe at home. Summary Adopting a healthy lifestyle and getting preventive care are  important in promoting health and wellness. Follow your health care provider's instructions about healthy diet, exercising, and getting tested or screened for diseases. Follow your health care provider's instructions on monitoring your cholesterol and blood pressure. This information is not intended to replace advice given to you by your health care provider. Make sure you discuss any questions you have with your health care provider. Document Revised: 02/18/2021 Document Reviewed: 02/18/2021 Elsevier Patient Education  [place].

## 2022-06-16 NOTE — Progress Notes (Signed)
Subjective:    Patient ID: Pam Butler, female    DOB: [birth year], 76 y.o.   MRN: [id number]      HPI Pam Butler is here for a Physical exam.   Had surgery on left hand - doing PT x 3 months.  She has decreased ROM of the fingers.    B/l hand pain, shoulder pain, knee pain, feet pain.  All related to OA.   She is exercising - doing exercises for her joints.    Medications and allergies reviewed with patient and updated if appropriate.  Current Outpatient Medications on File Prior to Visit  Medication Sig Dispense Refill   Acetaminophen (TYLENOL EXTRA STRENGTH PO) Take by mouth. Take one prn     aspirin EC 81 MG tablet Take 1 tablet (81 mg total) by mouth daily. Swallow whole. 90 tablet 3   calcium carbonate (CALCIUM 600) 600 MG TABS tablet Take 1 tablet (600 mg total) by mouth daily with breakfast. 30 tablet    cetirizine (ZYRTEC) 10 MG tablet TAKE 1 TABLET BY MOUTH EVERY DAY 90 tablet 1   Cholecalciferol (VITAMIN D) 50 MCG (2000 UT) CAPS 1 cap daily 30 capsule 0   citalopram (CELEXA) 20 MG tablet TAKE 1 TABLET BY MOUTH EVERY DAY 90 tablet 4   COLLAGEN PO Take by mouth.     ezetimibe (ZETIA) 10 MG tablet TAKE 1 TABLET BY MOUTH EVERY DAY 90 tablet 3   Melatonin 10 MG CAPS Take by mouth as needed.     Multiple Vitamins-Minerals (WOMENS MULTI VITAMIN & MINERAL PO) Take by mouth. Centrum Womens MVI-Take one daily     TURMERIC PO Take by mouth.     No current facility-administered medications on file prior to visit.    Review of Systems  Constitutional:  Negative for fever.  Eyes:  Negative for visual disturbance.  Respiratory:  Positive for shortness of breath. Negative for cough and wheezing.   Cardiovascular:  Positive for chest pain (seldom chest tightness - cardiac w/u neg - ? stress related) and leg swelling (no change). Negative for palpitations.  Gastrointestinal:  Negative for abdominal pain, blood in stool, constipation, diarrhea and nausea.       Occ gerd - takes tums   Genitourinary:  Negative for dysuria.  Musculoskeletal:  Positive for arthralgias. Negative for back pain.  Skin:  Negative for rash.  Neurological:  Negative for light-headedness and headaches.  Psychiatric/Behavioral:  Positive for dysphoric mood (controlled). The patient is nervous/anxious (controlled).        Objective:   Vitals:   06/17/22 1354  BP: 124/82  Pulse: 71  Temp: 98 F (36.7 C)  SpO2: 95%   Filed Weights   06/17/22 1354  Weight: 171 lb (77.6 kg)   Body mass index is 31.28 kg/m.  BP Readings from Last 3 Encounters:  06/17/22 124/82  05/07/22 106/70  02/20/22 138/70    Wt Readings from Last 3 Encounters:  06/17/22 171 lb (77.6 kg)  02/20/22 174 lb (78.9 kg)  02/04/22 172 lb 6.4 oz (78.2 kg)       Physical Exam Constitutional: She appears well-developed and well-nourished. No distress.  HENT:  Head: Normocephalic and atraumatic.  Right Ear: External ear normal. Normal ear canal and TM Left Ear: External ear normal.  Normal ear canal and TM Mouth/Throat: Oropharynx is clear and moist.  Eyes: Conjunctivae normal.  Neck: Neck supple. No tracheal deviation present. No thyromegaly present.  No carotid bruit  Cardiovascular: Normal rate,  regular rhythm and normal heart sounds.   No murmur heard.  No edema. Pulmonary/Chest: Effort normal and breath sounds normal. No respiratory distress. She has no wheezes. She has no rales.  Breast: deferred   Abdominal: Soft. She exhibits no distension. There is no tenderness.  Lymphadenopathy: She has no cervical adenopathy.  Skin: Skin is warm and dry. She is not diaphoretic.  Psychiatric: She has a normal mood and affect. Her behavior is normal.     Lab Results  Component Value Date   WBC 9.4 12/16/2021   HGB 12.8 12/16/2021   HCT 37.0 12/16/2021   PLT 318.0 12/16/2021   GLUCOSE 89 03/06/2022   CHOL 235 (H) 12/16/2021   TRIG 317.0 (H) 12/16/2021   HDL 41.20 12/16/2021   LDLDIRECT 149.0 12/16/2021    [place]  05/16/2020     Comment:     . LDL cholesterol not calculated. Triglyceride levels greater than 400 mg/dL invalidate calculated LDL results. . Reference range: <100 . Desirable range <100 mg/dL for primary prevention;   <70 mg/dL for patients with CHD or diabetic patients  with > or = 2 CHD risk factors. Marland Kitchen LDL-C is now calculated using the Martin-Hopkins  calculation, which is a validated novel method providing  better accuracy than the Friedewald equation in the  estimation of LDL-C.  Cresenciano Genre et al. Annamaria Helling. 4268;341(96): 2061-2068  (http://education.[facility].com/faq/FAQ164)    ALT 17 12/16/2021   AST 21 12/16/2021   NA 140 03/06/2022   K 4.2 03/06/2022   CL 105 03/06/2022   CREATININE 1.11 (H) 03/06/2022   BUN 24 03/06/2022   CO2 19 (L) 03/06/2022   TSH 2.22 12/16/2021   HGBA1C 5.7 12/16/2021         Assessment & Plan:   Physical exam: Screening blood work  ordered Exercise  - standing up exercises for OA, stationary bike Weight  encouraged weight loss Substance abuse  none   Reviewed recommended immunizations.   Health Maintenance  Topic Date Due   DEXA SCAN  02/17/2021   COVID-19 Vaccine (3 - Pfizer series) 07/03/2022 (Originally 03/07/2020)   Zoster Vaccines- Shingrix (1 of 2) 09/16/2022 (Originally 03/18/1995)   INFLUENZA VACCINE  01/11/2023 (Originally 05/13/2022)   TETANUS/TDAP  06/18/2023 (Originally 07/01/2015)   COLONOSCOPY (Pts 45-66yr Insurance coverage will need to be confirmed)  03/23/2024   Pneumonia Vaccine 77 Years old  Completed   Hepatitis C Screening  Completed   HPV VACCINES  Aged Out         See Problem List for Assessment and Plan of chronic medical problems.

## 2022-06-17 ENCOUNTER — Ambulatory Visit (INDEPENDENT_AMBULATORY_CARE_PROVIDER_SITE_OTHER): Payer: Medicare HMO | Admitting: Internal Medicine

## 2022-06-17 VITALS — BP 124/82 | HR 71 | Temp 98.0°F | Ht 62.0 in | Wt 171.0 lb

## 2022-06-17 DIAGNOSIS — F3289 Other specified depressive episodes: Secondary | ICD-10-CM

## 2022-06-17 DIAGNOSIS — E669 Obesity, unspecified: Secondary | ICD-10-CM

## 2022-06-17 DIAGNOSIS — R7303 Prediabetes: Secondary | ICD-10-CM | POA: Diagnosis not present

## 2022-06-17 DIAGNOSIS — R69 Illness, unspecified: Secondary | ICD-10-CM | POA: Diagnosis not present

## 2022-06-17 DIAGNOSIS — E7849 Other hyperlipidemia: Secondary | ICD-10-CM

## 2022-06-17 DIAGNOSIS — N1831 Chronic kidney disease, stage 3a: Secondary | ICD-10-CM

## 2022-06-17 DIAGNOSIS — Z0000 Encounter for general adult medical examination without abnormal findings: Secondary | ICD-10-CM

## 2022-06-17 DIAGNOSIS — M85851 Other specified disorders of bone density and structure, right thigh: Secondary | ICD-10-CM | POA: Diagnosis not present

## 2022-06-17 DIAGNOSIS — E559 Vitamin D deficiency, unspecified: Secondary | ICD-10-CM | POA: Diagnosis not present

## 2022-06-17 DIAGNOSIS — M25642 Stiffness of left hand, not elsewhere classified: Secondary | ICD-10-CM | POA: Diagnosis not present

## 2022-06-17 DIAGNOSIS — F419 Anxiety disorder, unspecified: Secondary | ICD-10-CM

## 2022-06-17 NOTE — Assessment & Plan Note (Signed)
Chronic cmp 

## 2022-06-17 NOTE — Assessment & Plan Note (Signed)
Chronic °Controlled, stable °Continue celexa 20 mg daily ° °

## 2022-06-17 NOTE — Assessment & Plan Note (Signed)
Chronic Taking vitamin d daily Check vitamin d level  

## 2022-06-17 NOTE — Assessment & Plan Note (Addendum)
Chronic Stressed weight loss and healthier diet Continue regular exercise - increase if able Encouraged low sugar/carb diet and diet high in protein and veges - decrease portions to help with weight loss -- discussed low inflammatory diet

## 2022-06-17 NOTE — Assessment & Plan Note (Signed)
Chronic Check lipid panel  Continue zetia 10 mg daily Regular exercise and healthy diet encouraged  

## 2022-06-17 NOTE — Assessment & Plan Note (Signed)
Chronic dexa due - ordered Continue calcium and vitamin d daily Encouraged regular exercise

## 2022-06-17 NOTE — Assessment & Plan Note (Signed)
Chronic Check a1c Low sugar / carb diet Stressed regular exercise  

## 2022-06-23 DIAGNOSIS — M25642 Stiffness of left hand, not elsewhere classified: Secondary | ICD-10-CM | POA: Diagnosis not present

## 2022-06-25 DIAGNOSIS — M65332 Trigger finger, left middle finger: Secondary | ICD-10-CM | POA: Diagnosis not present

## 2022-06-25 DIAGNOSIS — M65352 Trigger finger, left little finger: Secondary | ICD-10-CM | POA: Diagnosis not present

## 2022-06-25 DIAGNOSIS — M20032 Swan-neck deformity of left finger(s): Secondary | ICD-10-CM | POA: Insufficient documentation

## 2022-06-25 DIAGNOSIS — M65342 Trigger finger, left ring finger: Secondary | ICD-10-CM | POA: Diagnosis not present

## 2022-06-27 DIAGNOSIS — M25642 Stiffness of left hand, not elsewhere classified: Secondary | ICD-10-CM | POA: Diagnosis not present

## 2022-07-14 DIAGNOSIS — M79642 Pain in left hand: Secondary | ICD-10-CM | POA: Diagnosis not present

## 2022-07-14 DIAGNOSIS — M65332 Trigger finger, left middle finger: Secondary | ICD-10-CM | POA: Diagnosis not present

## 2022-07-14 DIAGNOSIS — M65352 Trigger finger, left little finger: Secondary | ICD-10-CM | POA: Diagnosis not present

## 2022-07-14 DIAGNOSIS — M20032 Swan-neck deformity of left finger(s): Secondary | ICD-10-CM | POA: Diagnosis not present

## 2022-07-14 DIAGNOSIS — Z4789 Encounter for other orthopedic aftercare: Secondary | ICD-10-CM | POA: Diagnosis not present

## 2022-07-14 DIAGNOSIS — M65342 Trigger finger, left ring finger: Secondary | ICD-10-CM | POA: Diagnosis not present

## 2022-07-15 ENCOUNTER — Other Ambulatory Visit: Payer: Self-pay | Admitting: Internal Medicine

## 2022-07-15 ENCOUNTER — Other Ambulatory Visit: Payer: Self-pay | Admitting: Obstetrics and Gynecology

## 2022-07-15 DIAGNOSIS — N3281 Overactive bladder: Secondary | ICD-10-CM

## 2022-07-17 DIAGNOSIS — M25642 Stiffness of left hand, not elsewhere classified: Secondary | ICD-10-CM | POA: Diagnosis not present

## 2022-07-18 DIAGNOSIS — M79642 Pain in left hand: Secondary | ICD-10-CM | POA: Diagnosis not present

## 2022-07-18 DIAGNOSIS — M25642 Stiffness of left hand, not elsewhere classified: Secondary | ICD-10-CM | POA: Diagnosis not present

## 2022-07-21 DIAGNOSIS — M25642 Stiffness of left hand, not elsewhere classified: Secondary | ICD-10-CM | POA: Diagnosis not present

## 2022-07-21 DIAGNOSIS — M79642 Pain in left hand: Secondary | ICD-10-CM | POA: Diagnosis not present

## 2022-07-23 DIAGNOSIS — M79642 Pain in left hand: Secondary | ICD-10-CM | POA: Diagnosis not present

## 2022-07-23 DIAGNOSIS — M25642 Stiffness of left hand, not elsewhere classified: Secondary | ICD-10-CM | POA: Diagnosis not present

## 2022-07-25 DIAGNOSIS — M79642 Pain in left hand: Secondary | ICD-10-CM | POA: Diagnosis not present

## 2022-07-25 DIAGNOSIS — M25642 Stiffness of left hand, not elsewhere classified: Secondary | ICD-10-CM | POA: Diagnosis not present

## 2022-07-28 DIAGNOSIS — M25642 Stiffness of left hand, not elsewhere classified: Secondary | ICD-10-CM | POA: Diagnosis not present

## 2022-07-28 DIAGNOSIS — M79642 Pain in left hand: Secondary | ICD-10-CM | POA: Diagnosis not present

## 2022-07-30 DIAGNOSIS — M25642 Stiffness of left hand, not elsewhere classified: Secondary | ICD-10-CM | POA: Diagnosis not present

## 2022-07-30 DIAGNOSIS — M79642 Pain in left hand: Secondary | ICD-10-CM | POA: Diagnosis not present

## 2022-08-04 ENCOUNTER — Encounter: Payer: Self-pay | Admitting: Internal Medicine

## 2022-08-04 DIAGNOSIS — M79642 Pain in left hand: Secondary | ICD-10-CM | POA: Diagnosis not present

## 2022-08-04 DIAGNOSIS — M25642 Stiffness of left hand, not elsewhere classified: Secondary | ICD-10-CM | POA: Diagnosis not present

## 2022-08-06 DIAGNOSIS — M25642 Stiffness of left hand, not elsewhere classified: Secondary | ICD-10-CM | POA: Diagnosis not present

## 2022-08-06 DIAGNOSIS — M79642 Pain in left hand: Secondary | ICD-10-CM | POA: Diagnosis not present

## 2022-08-06 NOTE — Progress Notes (Unsigned)
Cardiology Office Note:    Date:  08/07/2022   ID:  Pam Butler, DOB 1946/05/10, MRN [id number]  PCP:  [name], MD   [facility] Cardiologist:  [name], MD Referring MD: [name], MD   Chief Complaint/Reason for Referral: Chest pain and shortness of breath  ASSESSMENT:    1. Mild CAD   2. Hyperlipidemia LDL goal <70   3. Myalgia due to statin      PLAN:    In order of problems listed above:  Mild CAD:  Cont aspirin, zetia, and start atorvastatin '40mg'$  qday. Hyperlipidemia:  Start statin as above, check lipid panel, LFTs, and Lp(a) in 2 months. Myalgias due to statin: She had issues with Crestor in the past.  We will trial atorvastatin.  If she cannot tolerate this I will refer to pharmacy for further recommendations.   Dispo:  Return in about 1 year (around 08/08/2023).      Medication Adjustments/Labs and Tests Ordered: Current medicines are reviewed at length with the patient today.  Concerns regarding medicines are outlined above.   Tests Ordered: Orders Placed This Encounter  Procedures   Lipoprotein A (LPA)   Lipid panel   Hepatic function panel    Medication Changes: Meds ordered this encounter  Medications   aspirin EC 81 MG tablet    Sig: Take 1 tablet (81 mg total) by mouth daily. Swallow whole.    Dispense:  90 tablet    Refill:  3   atorvastatin (LIPITOR) 40 MG tablet    Sig: Take 1 tablet (40 mg total) by mouth daily.    Dispense:  90 tablet    Refill:  3    History of Present Illness:    FOCUSED PROBLEM LIST:   1.  COVID infection September 2022 2.  Depression 3.  Anxiety 4.  Mild coronary artery disease on coronary CTA 2023 5.  Myalgias in response to Crestor  March 2023 consultation: The patient is a 76 y.o. female with the indicated medical history here for recommendations regarding chest pain and shortness of breath.  The patient was seen by her primary care provider recently.  She reported dyspnea on  exertion which has been a chronic issue.  She also reported chest pain with exertion.  Her dyspnea with exertion has been worse since her COVID infection last year.  She gets occasional chest tightness with activity that improves with rest.  She was noted to be still grieving over her mother's death as well as dealing with anxiety and depressive issues.  The patient tells me that she will get chest pain when she exerts herself.  When she rests it would go away.  She will take a Tums when she gets the chest pain and will take about 20 or 30 minutes for it to abate.  She has been short of breath with exertion as well.  Shortness of breath has been worse since she contracted COVID.  She denies any peripheral edema, paroxysmal nocturnal dyspnea, orthopnea, presyncope or syncope.  She has had no severe bleeding episodes.  She is required no emergency room visits or hospitalizations.  Plan:  Coronary CTA  Today:  In the interim, the patient had a coronary CTA which showed only mild disease.  She is still plagued with shortness of breath that is unchanged and.  Only after COVID.  She denies any significant chest pains.  Most of her complaints are due to rheumatologic issues.  She fortunately has  not required any emergency room visits or hospitalizations.  She did not start aspirin or atorvastatin as I instructed previously.    Current Medications: Current Meds  Medication Sig   Acetaminophen (TYLENOL EXTRA STRENGTH PO) Take 1 tablet by mouth every evening. Take one prn   aspirin EC 81 MG tablet Take 1 tablet (81 mg total) by mouth daily. Swallow whole.   atorvastatin (LIPITOR) 40 MG tablet Take 1 tablet (40 mg total) by mouth daily.   calcium carbonate (CALCIUM 600) 600 MG TABS tablet Take 1 tablet (600 mg total) by mouth daily with breakfast.   cetirizine (ZYRTEC) 10 MG tablet TAKE 1 TABLET BY MOUTH EVERY DAY (Patient taking differently: Take 10 mg by mouth daily.)   Cholecalciferol (VITAMIN D) 50 MCG  (2000 UT) CAPS 1 cap daily (Patient taking differently: Take 1 capsule by mouth daily. 1 cap daily)   citalopram (CELEXA) 20 MG tablet TAKE 1 TABLET BY MOUTH EVERY DAY (Patient taking differently: Take 20 mg by mouth daily.)   COLLAGEN PO Take 1 tablet by mouth daily.   ezetimibe (ZETIA) 10 MG tablet TAKE 1 TABLET BY MOUTH EVERY DAY   GEMTESA 75 MG TABS TAKE 1 TABLET BY MOUTH EVERY DAY   Melatonin 10 MG CAPS Take 1 tablet by mouth as needed (sleep).   Multiple Vitamins-Minerals (WOMENS MULTI VITAMIN & MINERAL PO) Take 1 tablet by mouth daily. Centrum Womens MVI-Take one daily   TURMERIC PO Take 1 tablet by mouth daily.   [DISCONTINUED] aspirin EC 81 MG tablet Take 1 tablet (81 mg total) by mouth daily. Swallow whole.     Allergies:    Latex, Penicillins, Ciprofloxacin, Codeine, and Crestor [rosuvastatin calcium]   Social History:   Social History   Tobacco Use   Smoking status: Never   Smokeless tobacco: Never  Vaping Use   Vaping Use: Never used  Substance Use Topics   Alcohol use: No    Alcohol/week: 0.0 standard drinks of alcohol   Drug use: No     Family Hx: Family History  Problem Relation Age of Onset   Other Mother        Prediabetes   Colitis Mother    Pneumonia Father    Arthritis Sister    Heart attack Maternal Uncle 75   Heart attack Paternal Grandmother 70   Colon cancer Neg Hx      Review of Systems:   Please see the history of present illness.    All other systems reviewed and are negative.     EKGs/Labs/Other Test Reviewed:    EKG: 2019 sinus rhythm; EKG today demonstrates sinus rhythm  Prior CV studies:  Coronary CTA 2023: 1.  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 2. Coronary calcium score of 113. This was 60th percentile for age and sex matched control. 3. Normal coronary origin with right dominance. 4. Small PFO without clear evidence of shunting.  Echocardiogram 2023 with ejection fraction of 50 to 55% with normal diastolic function and  no significant valvular abnormalities    Imaging studies that I have independently reviewed today: Echo  Recent Labs: 12/16/2021: ALT 17; Hemoglobin 12.8; Platelets 318.0; TSH 2.22 02/20/2022: Pro B Natriuretic peptide (BNP) 177.0 03/06/2022: BUN 24; Creatinine, Ser 1.11; Potassium 4.2; Sodium 140   Recent Lipid Panel Lab Results  Component Value Date/Time   CHOL 235 (H) 12/16/2021 02:40 PM   TRIG 317.0 (H) 12/16/2021 02:40 PM   HDL 41.20 12/16/2021 02:40 PM   LDLCALC  05/16/2020 01:41  PM     Comment:     . LDL cholesterol not calculated. Triglyceride levels greater than 400 mg/dL invalidate calculated LDL results. . Reference range: <100 . Desirable range <100 mg/dL for primary prevention;   <70 mg/dL for patients with CHD or diabetic patients  with > or = 2 CHD risk factors. Marland Kitchen LDL-C is now calculated using the Martin-Hopkins  calculation, which is a validated novel method providing  better accuracy than the Friedewald equation in the  estimation of LDL-C.  Cresenciano Genre et al. Annamaria Helling. 1610;960(45): 2061-2068  (http://education.[facility].com/faq/FAQ164)    LDLDIRECT 149.0 12/16/2021 02:40 PM    Risk Assessment/Calculations:          Physical Exam:    VS:  BP 122/68 (BP Location: Left Arm, Patient Position: Sitting)   Pulse (!) 49   Ht 5' 1.5" (1.562 m)   Wt 170 lb (77.1 kg)   SpO2 94%   BMI 31.60 kg/m    Wt Readings from Last 3 Encounters:  08/07/22 170 lb (77.1 kg)  06/17/22 171 lb (77.6 kg)  02/20/22 174 lb (78.9 kg)    GENERAL:  No apparent distress, AOx3 HEENT:  No carotid bruits, +2 carotid impulses, no scleral icterus CAR: RRR no murmurs, gallops, rubs, or thrills RES:  Clear to auscultation bilaterally ABD:  Soft, nontender, nondistended, positive bowel sounds x 4 VASC:  +2 radial pulses, +2 carotid pulses, palpable pedal pulses NEURO:  CN 2-12 grossly intact; motor and sensory grossly intact PSYCH:  No active depression or anxiety EXT:  Non  pitting edema is present without ecchymosis, or cyanosis  Signed, Early Osmond, MD  08/07/2022 4:08 PM    [address], [place], [place]  [address] Phone: [phone]; Fax: [phone]   Note:  This document was prepared using Dragon voice recognition software and may include unintentional dictation errors.

## 2022-08-07 ENCOUNTER — Ambulatory Visit: Payer: Medicare HMO | Attending: Internal Medicine | Admitting: Internal Medicine

## 2022-08-07 ENCOUNTER — Encounter: Payer: Self-pay | Admitting: Internal Medicine

## 2022-08-07 VITALS — BP 122/68 | HR 49 | Ht 61.5 in | Wt 170.0 lb

## 2022-08-07 DIAGNOSIS — I2510 Atherosclerotic heart disease of native coronary artery without angina pectoris: Secondary | ICD-10-CM

## 2022-08-07 DIAGNOSIS — E785 Hyperlipidemia, unspecified: Secondary | ICD-10-CM

## 2022-08-07 DIAGNOSIS — T466X5A Adverse effect of antihyperlipidemic and antiarteriosclerotic drugs, initial encounter: Secondary | ICD-10-CM | POA: Diagnosis not present

## 2022-08-07 DIAGNOSIS — M791 Myalgia, unspecified site: Secondary | ICD-10-CM

## 2022-08-07 MED ORDER — ATORVASTATIN CALCIUM 40 MG PO TABS: 40.0000 mg | ORAL_TABLET | Freq: Every day | ORAL | 3 refills | Status: DC

## 2022-08-07 MED ORDER — ASPIRIN 81 MG PO TBEC: 81.0000 mg | DELAYED_RELEASE_TABLET | Freq: Every day | ORAL | 3 refills | Status: AC

## 2022-08-07 NOTE — Patient Instructions (Signed)
Medication Instructions:  Your physician has recommended you make the following change in your medication:  1.) start aspirin 81 mg - one tablet daily 2.) start atorvastatin 40 mg - take one tablet daily  *If you need a refill on your cardiac medications before your next appointment, please call your pharmacy*   Lab Work: Please return in 2 months (lipids, liver, Lpa)  If you have labs (blood work) drawn today and your tests are completely normal, you will receive your results only by: [place] (if you have MyChart) OR A paper copy in the mail If you have any lab test that is abnormal or we need to change your treatment, we will call you to review the results.   Testing/Procedures: none   Follow-Up: At [facility], you and your health needs are our priority.  As part of our continuing mission to provide you with exceptional heart care, we have created designated Provider Care Teams.  These Care Teams include your primary Cardiologist (physician) and Advanced Practice Providers (APPs -  Physician Assistants and Nurse Practitioners) who all work together to provide you with the care you need, when you need it.  We recommend signing up for the patient portal called "MyChart".  Sign up information is provided on this After Visit Summary.  MyChart is used to connect with patients for Virtual Visits (Telemedicine).  Patients are able to view lab/test results, encounter notes, upcoming appointments, etc.  Non-urgent messages can be sent to your provider as well.   To learn more about what you can do with MyChart, go to [email or web address].    Your next appointment:   12 month(s)  The format for your next appointment:   In Person  Provider:   Early Osmond, MD     Important Information About Sugar

## 2022-08-11 DIAGNOSIS — M25642 Stiffness of left hand, not elsewhere classified: Secondary | ICD-10-CM | POA: Diagnosis not present

## 2022-08-11 DIAGNOSIS — M79642 Pain in left hand: Secondary | ICD-10-CM | POA: Diagnosis not present

## 2022-08-15 DIAGNOSIS — M25642 Stiffness of left hand, not elsewhere classified: Secondary | ICD-10-CM | POA: Diagnosis not present

## 2022-08-18 DIAGNOSIS — M79642 Pain in left hand: Secondary | ICD-10-CM | POA: Diagnosis not present

## 2022-08-18 DIAGNOSIS — M25642 Stiffness of left hand, not elsewhere classified: Secondary | ICD-10-CM | POA: Diagnosis not present

## 2022-08-28 DIAGNOSIS — M79642 Pain in left hand: Secondary | ICD-10-CM | POA: Diagnosis not present

## 2022-08-28 DIAGNOSIS — M25642 Stiffness of left hand, not elsewhere classified: Secondary | ICD-10-CM | POA: Diagnosis not present

## 2022-09-16 DIAGNOSIS — Z6831 Body mass index (BMI) 31.0-31.9, adult: Secondary | ICD-10-CM | POA: Diagnosis not present

## 2022-09-16 DIAGNOSIS — R69 Illness, unspecified: Secondary | ICD-10-CM | POA: Diagnosis not present

## 2022-09-16 DIAGNOSIS — Z880 Allergy status to penicillin: Secondary | ICD-10-CM | POA: Diagnosis not present

## 2022-09-16 DIAGNOSIS — N393 Stress incontinence (female) (male): Secondary | ICD-10-CM | POA: Diagnosis not present

## 2022-09-16 DIAGNOSIS — R030 Elevated blood-pressure reading, without diagnosis of hypertension: Secondary | ICD-10-CM | POA: Diagnosis not present

## 2022-09-16 DIAGNOSIS — E785 Hyperlipidemia, unspecified: Secondary | ICD-10-CM | POA: Diagnosis not present

## 2022-09-16 DIAGNOSIS — E669 Obesity, unspecified: Secondary | ICD-10-CM | POA: Diagnosis not present

## 2022-09-16 DIAGNOSIS — Z881 Allergy status to other antibiotic agents status: Secondary | ICD-10-CM | POA: Diagnosis not present

## 2022-09-16 DIAGNOSIS — J309 Allergic rhinitis, unspecified: Secondary | ICD-10-CM | POA: Diagnosis not present

## 2022-09-16 DIAGNOSIS — M1990 Unspecified osteoarthritis, unspecified site: Secondary | ICD-10-CM | POA: Diagnosis not present

## 2022-09-18 DIAGNOSIS — M25642 Stiffness of left hand, not elsewhere classified: Secondary | ICD-10-CM | POA: Diagnosis not present

## 2022-09-18 DIAGNOSIS — M79642 Pain in left hand: Secondary | ICD-10-CM | POA: Diagnosis not present

## 2022-09-26 DIAGNOSIS — M25642 Stiffness of left hand, not elsewhere classified: Secondary | ICD-10-CM | POA: Diagnosis not present

## 2022-09-26 DIAGNOSIS — M79642 Pain in left hand: Secondary | ICD-10-CM | POA: Diagnosis not present

## 2022-10-09 ENCOUNTER — Other Ambulatory Visit: Payer: Medicare HMO

## 2022-10-23 DIAGNOSIS — M65352 Trigger finger, left little finger: Secondary | ICD-10-CM | POA: Diagnosis not present

## 2022-10-23 DIAGNOSIS — M65332 Trigger finger, left middle finger: Secondary | ICD-10-CM | POA: Diagnosis not present

## 2022-10-23 DIAGNOSIS — Z4789 Encounter for other orthopedic aftercare: Secondary | ICD-10-CM | POA: Diagnosis not present

## 2022-10-23 DIAGNOSIS — M65342 Trigger finger, left ring finger: Secondary | ICD-10-CM | POA: Diagnosis not present

## 2022-11-13 DIAGNOSIS — L298 Other pruritus: Secondary | ICD-10-CM | POA: Diagnosis not present

## 2022-11-13 DIAGNOSIS — L503 Dermatographic urticaria: Secondary | ICD-10-CM | POA: Diagnosis not present

## 2022-11-13 DIAGNOSIS — L821 Other seborrheic keratosis: Secondary | ICD-10-CM | POA: Diagnosis not present

## 2022-11-13 DIAGNOSIS — D2239 Melanocytic nevi of other parts of face: Secondary | ICD-10-CM | POA: Diagnosis not present

## 2022-12-08 ENCOUNTER — Inpatient Hospital Stay: Admission: RE | Admit: 2022-12-08 | Payer: Medicare HMO | Source: Ambulatory Visit

## 2022-12-16 ENCOUNTER — Encounter: Payer: Self-pay | Admitting: Internal Medicine

## 2022-12-16 NOTE — Progress Notes (Signed)
Subjective:    Patient ID: Pam Butler, female    DOB: March 29, 1946, 77 y.o.   MRN: [id number]     HPI Pam Butler is here for follow up of her chronic medical problems, including prediabetes, hld, b/l leg edema, DOE, chest tightness, anxiety, depression, CKD    Medications and allergies reviewed with patient and updated if appropriate.  Current Outpatient Medications on File Prior to Visit  Medication Sig Dispense Refill   Acetaminophen (TYLENOL EXTRA STRENGTH PO) Take 1 tablet by mouth every evening. Take one prn     aspirin EC 81 MG tablet Take 1 tablet (81 mg total) by mouth daily. Swallow whole. 90 tablet 3   atorvastatin (LIPITOR) 40 MG tablet Take 1 tablet (40 mg total) by mouth daily. 90 tablet 3   calcium carbonate (CALCIUM 600) 600 MG TABS tablet Take 1 tablet (600 mg total) by mouth daily with breakfast. 30 tablet    cetirizine (ZYRTEC) 10 MG tablet TAKE 1 TABLET BY MOUTH EVERY DAY (Patient taking differently: Take 10 mg by mouth daily.) 90 tablet 1   Cholecalciferol (VITAMIN D) 50 MCG (2000 UT) CAPS 1 cap daily (Patient taking differently: Take 1 capsule by mouth daily. 1 cap daily) 30 capsule 0   citalopram (CELEXA) 20 MG tablet TAKE 1 TABLET BY MOUTH EVERY DAY (Patient taking differently: Take 20 mg by mouth daily.) 90 tablet 4   COLLAGEN PO Take 1 tablet by mouth daily.     ezetimibe (ZETIA) 10 MG tablet TAKE 1 TABLET BY MOUTH EVERY DAY 90 tablet 3   GEMTESA 75 MG TABS TAKE 1 TABLET BY MOUTH EVERY DAY 90 tablet 3   Melatonin 10 MG CAPS Take 1 tablet by mouth as needed (sleep).     Multiple Vitamins-Minerals (WOMENS MULTI VITAMIN & MINERAL PO) Take 1 tablet by mouth daily. Centrum Womens MVI-Take one daily     TURMERIC PO Take 1 tablet by mouth daily.     No current facility-administered medications on file prior to visit.     Review of Systems     Objective:  There were no vitals filed for this visit. BP Readings from Last 3 Encounters:  08/07/22 122/68  06/17/22  124/82  05/07/22 106/70   Wt Readings from Last 3 Encounters:  08/07/22 170 lb (77.1 kg)  06/17/22 171 lb (77.6 kg)  02/20/22 174 lb (78.9 kg)   There is no height or weight on file to calculate BMI.    Physical Exam     Lab Results  Component Value Date   WBC 9.4 12/16/2021   HGB 12.8 12/16/2021   HCT 37.0 12/16/2021   PLT 318.0 12/16/2021   GLUCOSE 89 03/06/2022   CHOL 235 (H) 12/16/2021   TRIG 317.0 (H) 12/16/2021   HDL 41.20 12/16/2021   LDLDIRECT 149.0 12/16/2021   LDLCALC  05/16/2020     Comment:     . LDL cholesterol not calculated. Triglyceride levels greater than 400 mg/dL invalidate calculated LDL results. . Reference range: <100 . Desirable range <100 mg/dL for primary prevention;   <70 mg/dL for patients with CHD or diabetic patients  with > or = 2 CHD risk factors. Marland Kitchen LDL-C is now calculated using the Martin-Hopkins  calculation, which is a validated novel method providing  better accuracy than the Friedewald equation in the  estimation of LDL-C.  Horald Pollen et al. Lenox Ahr. 4098;119(14): 2061-2068  (http://education.[facility].com/faq/FAQ164)    ALT 17 12/16/2021   AST 21 12/16/2021  NA 140 03/06/2022   K 4.2 03/06/2022   CL 105 03/06/2022   CREATININE 1.11 (H) 03/06/2022   BUN 24 03/06/2022   CO2 19 (L) 03/06/2022   TSH 2.22 12/16/2021   HGBA1C 5.7 12/16/2021     Assessment & Plan:    See Problem List for Assessment and Plan of chronic medical problems.   This encounter was created in error - please disregard.

## 2022-12-16 NOTE — Patient Instructions (Addendum)
      Blood work was ordered.   The lab is on the first floor.    Medications changes include :       A referral was ordered for XXX.     Someone will call you to schedule an appointment.    Return in about 6 months (around 06/19/2023) for Physical Exam.

## 2022-12-17 ENCOUNTER — Encounter: Payer: Medicare HMO | Admitting: Internal Medicine

## 2022-12-17 DIAGNOSIS — F3289 Other specified depressive episodes: Secondary | ICD-10-CM

## 2022-12-17 DIAGNOSIS — I2510 Atherosclerotic heart disease of native coronary artery without angina pectoris: Secondary | ICD-10-CM | POA: Insufficient documentation

## 2022-12-17 DIAGNOSIS — E559 Vitamin D deficiency, unspecified: Secondary | ICD-10-CM

## 2022-12-17 DIAGNOSIS — R32 Unspecified urinary incontinence: Secondary | ICD-10-CM

## 2022-12-17 DIAGNOSIS — E669 Obesity, unspecified: Secondary | ICD-10-CM

## 2022-12-17 DIAGNOSIS — F419 Anxiety disorder, unspecified: Secondary | ICD-10-CM

## 2022-12-17 DIAGNOSIS — R7303 Prediabetes: Secondary | ICD-10-CM

## 2022-12-17 DIAGNOSIS — N1831 Chronic kidney disease, stage 3a: Secondary | ICD-10-CM

## 2022-12-17 DIAGNOSIS — E7849 Other hyperlipidemia: Secondary | ICD-10-CM

## 2022-12-17 NOTE — Assessment & Plan Note (Signed)
Chronic Regular exercise and healthy diet encouraged Check lipid panel  Continue Zetia 10 mg daily, atorvastatin 40 mg daily

## 2022-12-17 NOTE — Assessment & Plan Note (Signed)
Chronic Mild, stable CMP, CBC Advised increase water intake Avoid NSAIDs Low-sodium diet

## 2022-12-17 NOTE — Assessment & Plan Note (Signed)
Chronic Taking vitamin D daily Check vitamin D level  

## 2022-12-17 NOTE — Assessment & Plan Note (Signed)
Chronic Following with urogynecology On Gemtesa 75 mg daily

## 2022-12-17 NOTE — Assessment & Plan Note (Signed)
Chronic Controlled, Stable Continue citalopram 20 mg daily

## 2022-12-17 NOTE — Assessment & Plan Note (Signed)
Chronic Check a1c Low sugar / carb diet Stressed regular exercise  

## 2022-12-17 NOTE — Assessment & Plan Note (Signed)
Chronic Mild Following with cardiology On Zetia 10 mg daily, atorvastatin 40 mg daily Continue aspirin 81 mg daily

## 2022-12-17 NOTE — Assessment & Plan Note (Signed)
Chronic Encouraged weight loss Advised regular exercise, healthy diet with decreased portions

## 2022-12-18 IMAGING — DX DG CERVICAL SPINE COMPLETE 4+V
5 series · 7 of 7 positions shown · non-contrast
Comparison: None.

CLINICAL DATA: Neck pain, chronic bilateral shoulder pain

EXAM:
CERVICAL SPINE - COMPLETE 4+ VIEW

[c-spine lat]
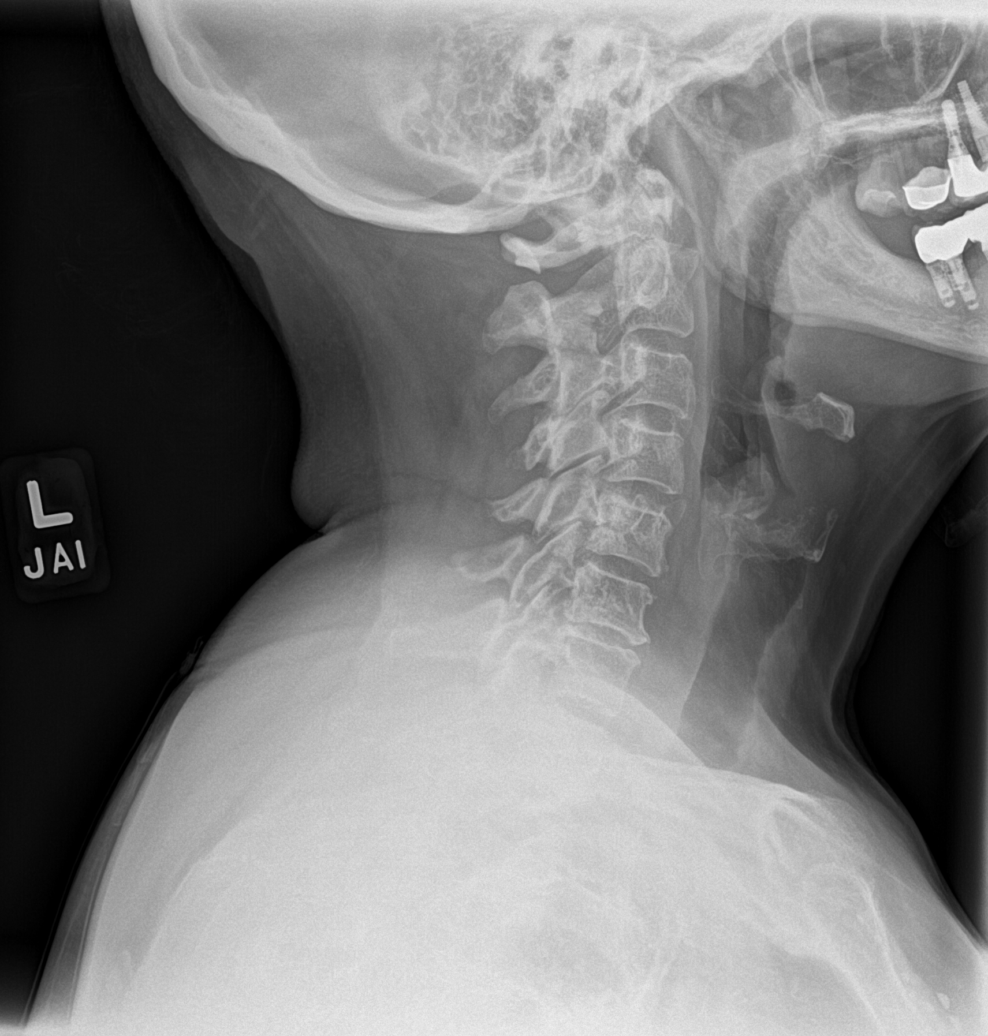

[c-spine obl (1 of 2)]
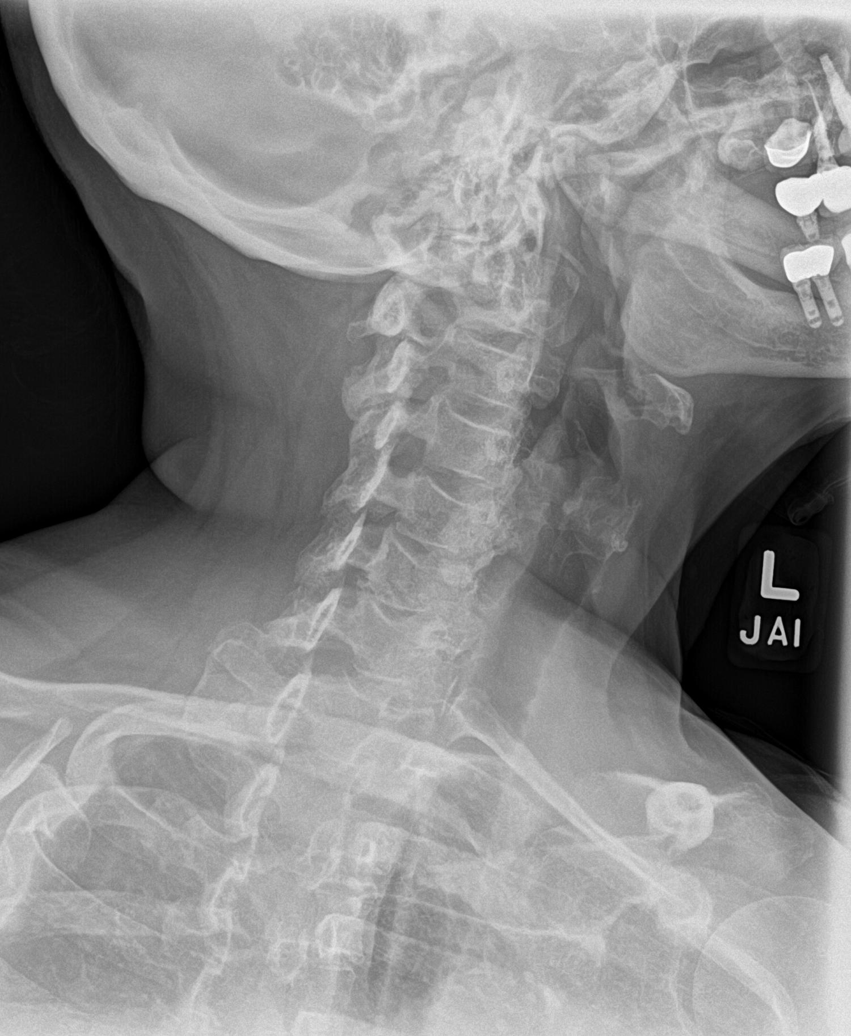

[c-spine obl (2 of 2)]
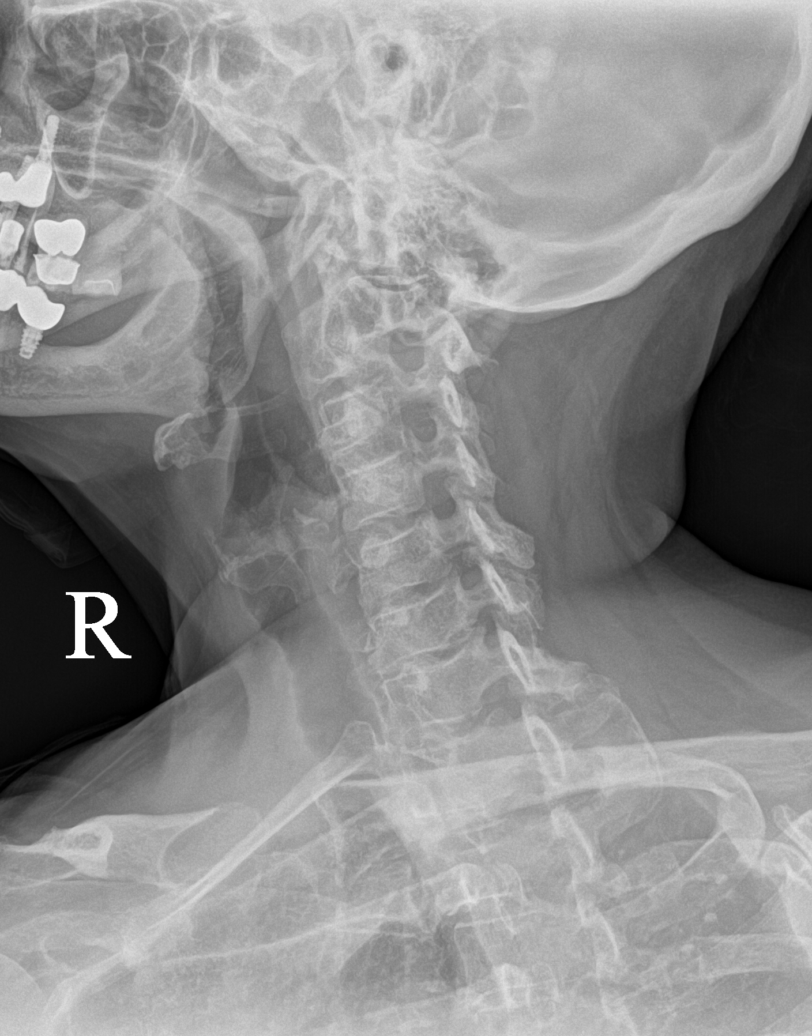

[c-spine ap]
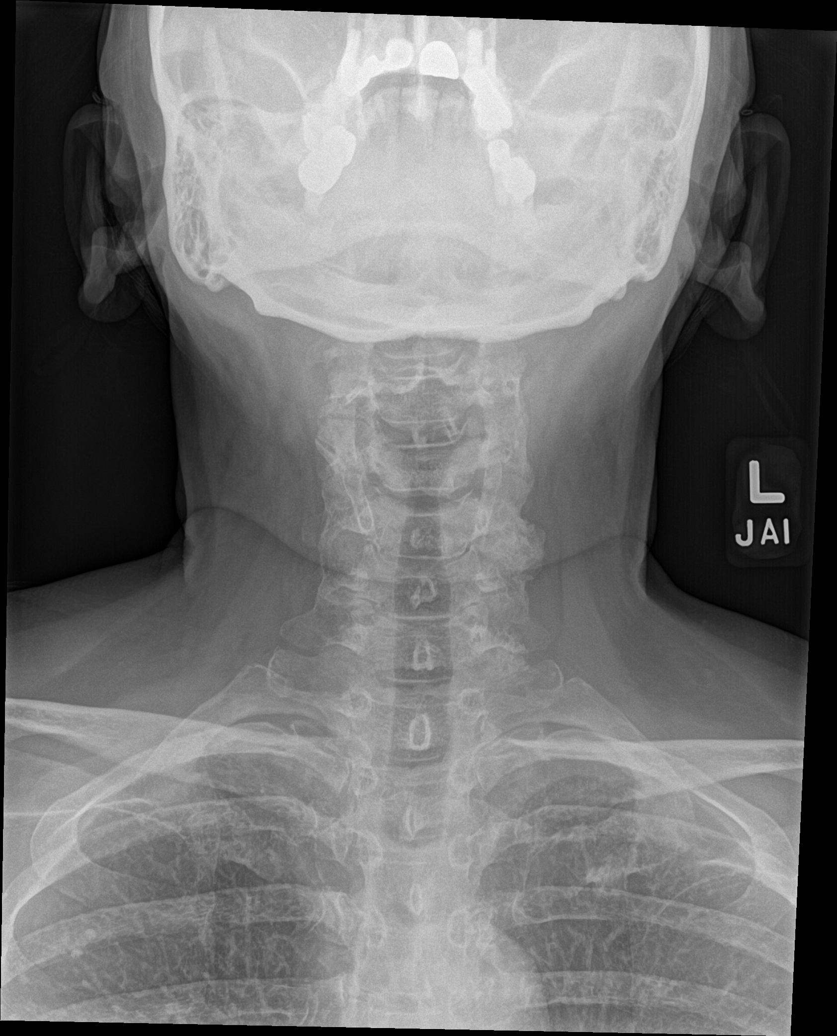

[Series 5: c-spine open mouth · 0.14mm/px · 3 of 3 slices shown]
[im 1/3]
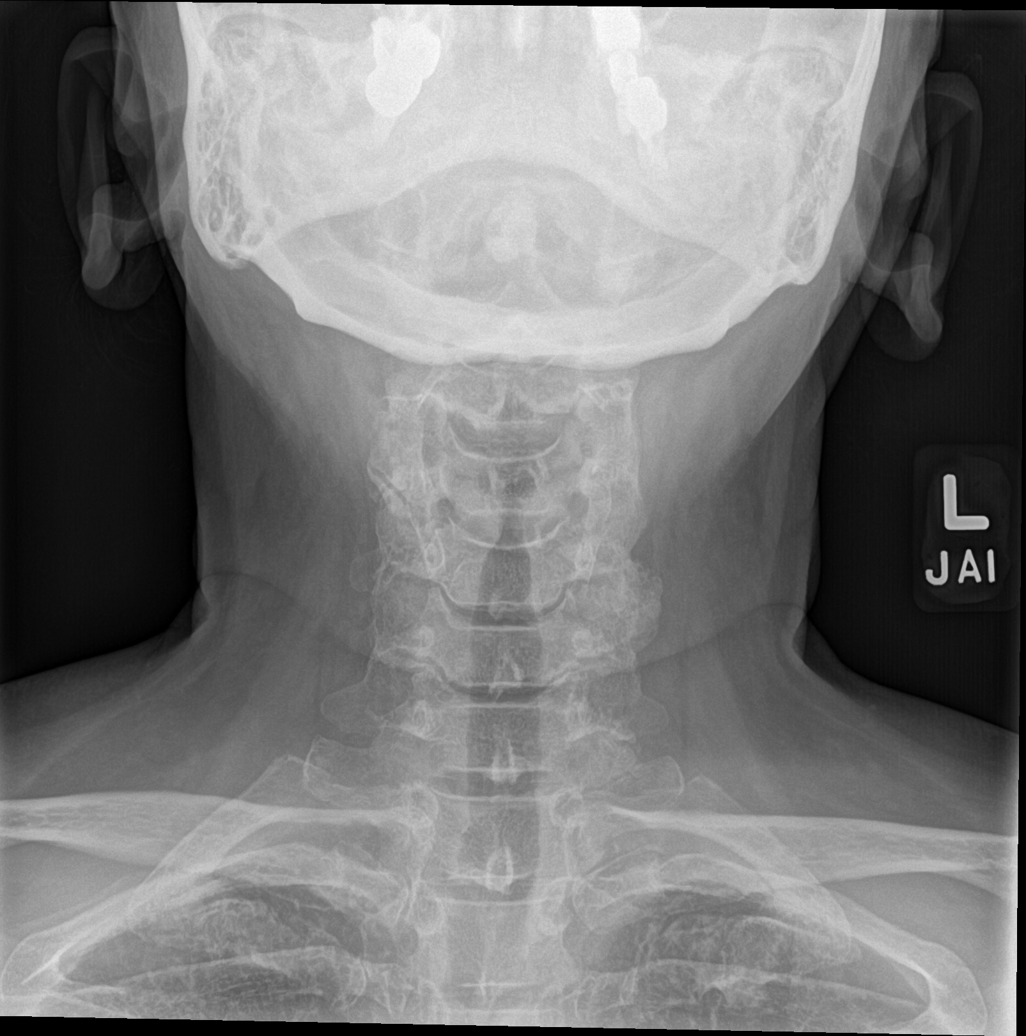
[im 2/3]
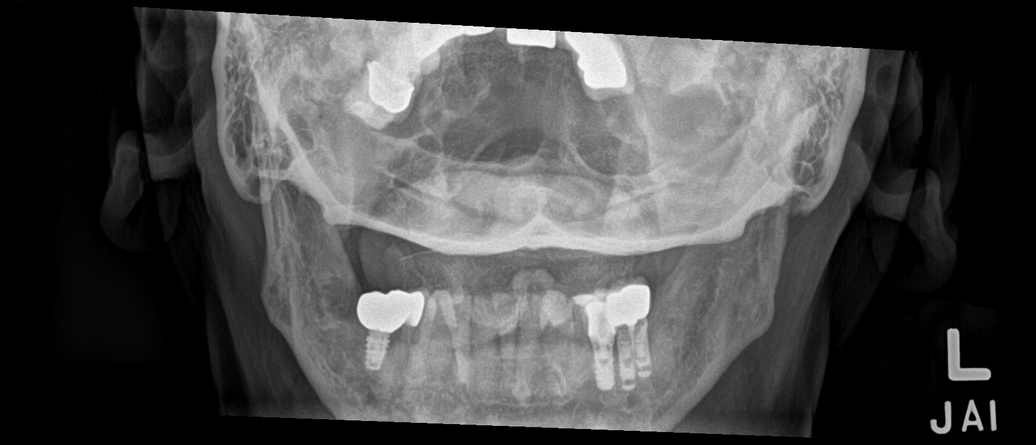
[im 3/3]
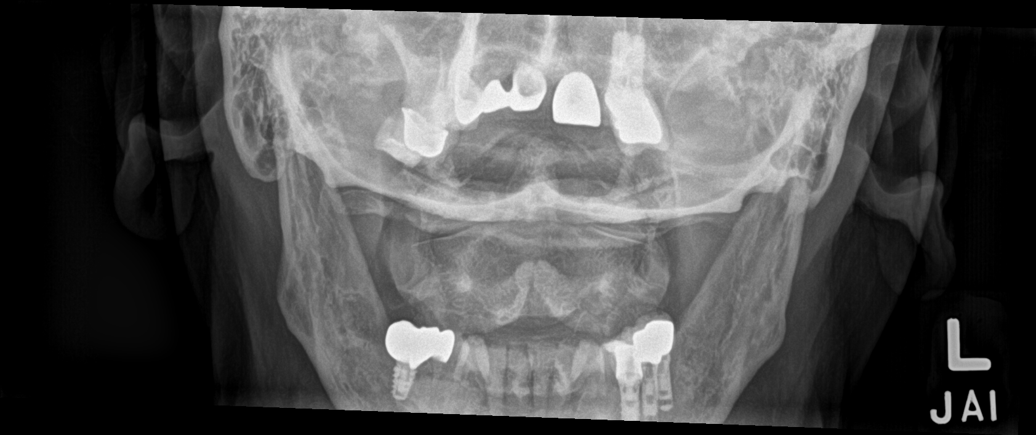

[7 of 7 positions shown; findings below may reference images not displayed]

FINDINGS: Five view radiograph cervical spine. Normal cervical lordosis. No
acute fracture or listhesis of the cervical spine. Vertebral body
height has been preserved. There is mild intervertebral disc space
narrowing and endplate remodeling at C5-C7, more severe at C6-7,
compatible with changes of mild to moderate degenerative disc
disease. The spinal canal is widely patent. The prevertebral soft
tissues are not thickened. Oblique views demonstrate moderate facet
arthrosis on the right at C3-4 resulting in mild neural foraminal
narrowing at this level. Additionally, there is moderate
uncovertebral arthrosis at C6-7 resulting in mild right neural
foraminal narrowing at this level. On the left, there is moderate
facet arthrosis at C4-5 and severe facet arthrosis at C5-6 resulting
in and mild left neural foraminal narrowing at C5-6. There is mild
uncovertebral arthrosis on the left at C6-7 resulting in mild neural
foraminal narrowing at this level. Odontoid view is limited by
overlying osseous structures.
IMPRESSION: No acute fracture or listhesis.

Multilevel degenerative disc and degenerative joint disease, with
associated multilevel mild-to-moderate neural foraminal narrowing,
as described above.

## 2023-01-06 DIAGNOSIS — Z008 Encounter for other general examination: Secondary | ICD-10-CM | POA: Diagnosis not present

## 2023-01-06 DIAGNOSIS — N189 Chronic kidney disease, unspecified: Secondary | ICD-10-CM | POA: Diagnosis not present

## 2023-01-06 DIAGNOSIS — L299 Pruritus, unspecified: Secondary | ICD-10-CM | POA: Diagnosis not present

## 2023-01-06 DIAGNOSIS — M8580 Other specified disorders of bone density and structure, unspecified site: Secondary | ICD-10-CM | POA: Diagnosis not present

## 2023-01-06 DIAGNOSIS — N3943 Post-void dribbling: Secondary | ICD-10-CM | POA: Diagnosis not present

## 2023-01-06 DIAGNOSIS — I700 Atherosclerosis of aorta: Secondary | ICD-10-CM | POA: Diagnosis not present

## 2023-01-06 DIAGNOSIS — I2510 Atherosclerotic heart disease of native coronary artery without angina pectoris: Secondary | ICD-10-CM | POA: Diagnosis not present

## 2023-01-06 DIAGNOSIS — K219 Gastro-esophageal reflux disease without esophagitis: Secondary | ICD-10-CM | POA: Diagnosis not present

## 2023-01-06 DIAGNOSIS — R030 Elevated blood-pressure reading, without diagnosis of hypertension: Secondary | ICD-10-CM | POA: Diagnosis not present

## 2023-01-06 DIAGNOSIS — E669 Obesity, unspecified: Secondary | ICD-10-CM | POA: Diagnosis not present

## 2023-01-06 DIAGNOSIS — R69 Illness, unspecified: Secondary | ICD-10-CM | POA: Diagnosis not present

## 2023-01-06 DIAGNOSIS — E785 Hyperlipidemia, unspecified: Secondary | ICD-10-CM | POA: Diagnosis not present

## 2023-02-27 ENCOUNTER — Telehealth: Payer: Self-pay

## 2023-02-27 NOTE — Telephone Encounter (Signed)
Patient will self test and follow up regarding results.

## 2023-02-27 NOTE — Telephone Encounter (Signed)
She needs to test yourself.  We need to know if she is positive or not.

## 2023-03-22 ENCOUNTER — Other Ambulatory Visit: Payer: Self-pay | Admitting: Internal Medicine

## 2023-04-15 ENCOUNTER — Other Ambulatory Visit: Payer: Self-pay | Admitting: Internal Medicine

## 2023-05-01 ENCOUNTER — Other Ambulatory Visit: Payer: Self-pay | Admitting: Internal Medicine

## 2023-05-12 ENCOUNTER — Ambulatory Visit: Payer: Medicare HMO | Admitting: Obstetrics and Gynecology

## 2023-05-13 ENCOUNTER — Ambulatory Visit: Payer: Medicare HMO | Admitting: Obstetrics and Gynecology

## 2023-05-13 DIAGNOSIS — N3281 Overactive bladder: Secondary | ICD-10-CM | POA: Diagnosis not present

## 2023-05-13 NOTE — Progress Notes (Unsigned)
[facility] Return Visit  SUBJECTIVE  History of Present Illness: Pam Butler is a 77 y.o. female seen in follow-up for overactive bladder and bowel leakage. She was on the gemtesa 75mg  but does not think she is taking it anymore. She is not sure when she stopped it. Has some confusion between this medication and her cardiac medications. Has noticed some lower extremity swelling.  She is using something for bladder control over the counter for a few weeks but has not noticed an improvement.   Currently urinating every hour and a half. Urgency has decreased somewhat but has leakage on the way to the bathroom. Using 3-4 pads per day. Denies any burning with urination.   Has a history of a sling (mesh). Denies leakage with cough/ sneeze.   Past Medical History: Patient  has a past medical history of Allergy, Anxiety, Cataract, Depression, History of bloody stools, History of muscle pain, and Loose stools.   Past Surgical History: She  has a past surgical history that includes Abdominal hysterectomy; Cholecystectomy; Tubal ligation; Breast biopsy (Right, 2001); Colonoscopy; Appendectomy; and Hand surgery (Left).   Medications: She has a current medication list which includes the following prescription(s): acetaminophen, aspirin ec, atorvastatin, calcium carbonate, cetirizine, vitamin d, citalopram, collagen, ezetimibe, gemtesa, melatonin, multiple vitamins-minerals, and turmeric.   Allergies: Patient is allergic to latex, penicillins, ciprofloxacin, codeine, and crestor [rosuvastatin calcium].   Social History: Patient  reports that she has never smoked. She has never used smokeless tobacco. She reports that she does not drink alcohol and does not use drugs.      OBJECTIVE     Physical Exam: Vitals:   05/13/23 1103  BP: 100/68  Pulse: 71    Gen: No apparent distress, A&O x 3.  Detailed Urogynecologic Evaluation:  Deferred.    ASSESSMENT AND PLAN    Pam Butler is a  77 y.o. with:  No diagnosis found.  - PTNS  Follow up 1 year or sooner if needed  Marguerita Beards, MD  Time spent: I spent 25 minutes dedicated to the care of this patient on the date of this encounter to include pre-visit review of records, face-to-face time with the patient  and post visit documentation.

## 2023-05-14 ENCOUNTER — Encounter: Payer: Self-pay | Admitting: Obstetrics and Gynecology

## 2023-05-19 DIAGNOSIS — L821 Other seborrheic keratosis: Secondary | ICD-10-CM | POA: Diagnosis not present

## 2023-05-19 DIAGNOSIS — L298 Other pruritus: Secondary | ICD-10-CM | POA: Diagnosis not present

## 2023-05-19 DIAGNOSIS — R21 Rash and other nonspecific skin eruption: Secondary | ICD-10-CM | POA: Diagnosis not present

## 2023-05-21 ENCOUNTER — Ambulatory Visit: Payer: Medicare HMO | Admitting: Obstetrics and Gynecology

## 2023-05-28 ENCOUNTER — Encounter: Payer: Self-pay | Admitting: Obstetrics and Gynecology

## 2023-05-28 ENCOUNTER — Ambulatory Visit: Payer: Medicare HMO | Admitting: Obstetrics and Gynecology

## 2023-05-28 VITALS — BP 103/69 | HR 71

## 2023-05-28 DIAGNOSIS — R32 Unspecified urinary incontinence: Secondary | ICD-10-CM

## 2023-05-28 DIAGNOSIS — N3281 Overactive bladder: Secondary | ICD-10-CM

## 2023-05-28 NOTE — Progress Notes (Signed)
[facility]  PTNS VISIT  CC:  Overactive bladder  77 y.o. with refractory overactive bladder who presents for percutaneous tibial nerve stimulation. The patient presents for PTNS session # 1.   Procedure: The patient was placed in the sitting position and the right lower extremity was prepped in the usual fashion. The PTNS needle was then inserted at a 60 degree angle, 5 cm cephalad and 2 cm posterior to the medial malleolus. The PTNS unit was then programmed and an optimal response was noted at 8 milliamps. The PTNS stimulation was then performed at this setting for 30 minutes without incident and the patient tolerated the procedure well. The needle was removed and hemostasis was noted.   The pt will return in 1 week for PTNS session # 2. All questions were answered.

## 2023-06-04 ENCOUNTER — Ambulatory Visit: Payer: Medicare HMO | Admitting: Obstetrics and Gynecology

## 2023-06-04 ENCOUNTER — Encounter: Payer: Self-pay | Admitting: Obstetrics and Gynecology

## 2023-06-04 ENCOUNTER — Telehealth: Payer: Self-pay | Admitting: Internal Medicine

## 2023-06-04 VITALS — BP 109/70 | HR 72

## 2023-06-04 DIAGNOSIS — N3281 Overactive bladder: Secondary | ICD-10-CM

## 2023-06-04 DIAGNOSIS — R32 Unspecified urinary incontinence: Secondary | ICD-10-CM

## 2023-06-04 NOTE — Progress Notes (Signed)
[facility]  PTNS VISIT  CC:  Overactive bladder  77 y.o. with refractory overactive bladder who presents for percutaneous tibial nerve stimulation. The patient presents for PTNS session # 2.   Procedure: The patient was placed in the sitting position and the left lower extremity was prepped in the usual fashion. The PTNS needle was then inserted at a 60 degree angle, 5 cm cephalad and 2 cm posterior to the medial malleolus. The PTNS unit was then programmed and an optimal response was noted at 9 milliamps. The PTNS stimulation was then performed at this setting for 30 minutes without incident and the patient tolerated the procedure well. The needle was removed and hemostasis was noted.   The pt will return in 1 week for PTNS session # 3. All questions were answered.

## 2023-06-04 NOTE — Telephone Encounter (Addendum)
Overdue for follow up - please call her to schedule a f/u appt   ----- Message from Marguerita Beards sent at 05/14/2023  9:15 AM EDT ----- Hi Dr Lawerance Bach, I saw this lovely lady yesterday and just wanted to make you aware that she has a lot of confusion regarding her medications. Its unclear what she is actually taking. She was seeing me for follow up about the gemtesa but she thinks she stopped it then kept getting it confused with the lasix then said she stopped that too. Its been a while since she has seen you for follow up so thought maybe your office could reach out for an appt for her. She told our MA she has been a lot more forgetful since she had Covid. Thanks!

## 2023-06-04 NOTE — Telephone Encounter (Signed)
Appointment made for 06/16/23.

## 2023-06-06 ENCOUNTER — Other Ambulatory Visit: Payer: Self-pay | Admitting: Internal Medicine

## 2023-06-11 ENCOUNTER — Ambulatory Visit: Payer: Medicare HMO | Admitting: Obstetrics and Gynecology

## 2023-06-11 ENCOUNTER — Encounter: Payer: Self-pay | Admitting: Obstetrics and Gynecology

## 2023-06-11 VITALS — BP 115/68 | HR 74

## 2023-06-11 DIAGNOSIS — R32 Unspecified urinary incontinence: Secondary | ICD-10-CM | POA: Diagnosis not present

## 2023-06-11 DIAGNOSIS — N3281 Overactive bladder: Secondary | ICD-10-CM

## 2023-06-11 NOTE — Progress Notes (Signed)
[facility]  PTNS VISIT  CC:  Overactive bladder  77 y.o. with refractory overactive bladder who presents for percutaneous tibial nerve stimulation. The patient presents for PTNS session # 3.   Procedure: The patient was placed in the sitting position and the right lower extremity was prepped in the usual fashion. The PTNS needle was then inserted at a 60 degree angle, 5 cm cephalad and 2 cm posterior to the medial malleolus. The PTNS unit was then programmed and an optimal response was noted at 6 milliamps. The PTNS stimulation was then performed at this setting for 30 minutes without incident and the patient tolerated the procedure well. The needle was removed and hemostasis was noted.   The pt will return in 1 week for PTNS session # 4. All questions were answered.

## 2023-06-15 NOTE — Progress Notes (Unsigned)
Subjective:    Patient ID: Pam Butler, female    DOB: 1946/03/15, 77 y.o.   MRN: [id number]     HPI Pam Butler is here for follow up of her chronic medical problems.  ? Memory concerns  Medications and allergies reviewed with patient and updated if appropriate.  Current Outpatient Medications on File Prior to Visit  Medication Sig Dispense Refill   Acetaminophen (TYLENOL EXTRA STRENGTH PO) Take 1 tablet by mouth every evening. Take one prn     aspirin EC 81 MG tablet Take 1 tablet (81 mg total) by mouth daily. Swallow whole. 90 tablet 3   atorvastatin (LIPITOR) 40 MG tablet Take 1 tablet (40 mg total) by mouth daily. 90 tablet 3   calcium carbonate (CALCIUM 600) 600 MG TABS tablet Take 1 tablet (600 mg total) by mouth daily with breakfast. 30 tablet    cetirizine (ZYRTEC) 10 MG tablet Take 1 tablet (10 mg total) by mouth daily. Annual appt due in Sept must see provider for future refills 90 tablet 0   Cholecalciferol (VITAMIN D) 50 MCG (2000 UT) CAPS 1 cap daily (Patient taking differently: Take 1 capsule by mouth daily. 1 cap daily) 30 capsule 0   citalopram (CELEXA) 20 MG tablet TAKE 1 TABLET BY MOUTH EVERY DAY (Patient taking differently: Take 20 mg by mouth daily.) 90 tablet 4   COLLAGEN PO Take 1 tablet by mouth daily.     ezetimibe (ZETIA) 10 MG tablet TAKE 1 TABLET BY MOUTH EVERY DAY 90 tablet 3   GEMTESA 75 MG TABS TAKE 1 TABLET BY MOUTH EVERY DAY 90 tablet 3   Melatonin 10 MG CAPS Take 1 tablet by mouth as needed (sleep).     Multiple Vitamins-Minerals (WOMENS MULTI VITAMIN & MINERAL PO) Take 1 tablet by mouth daily. Centrum Womens MVI-Take one daily     TURMERIC PO Take 1 tablet by mouth daily.     No current facility-administered medications on file prior to visit.     Review of Systems     Objective:  There were no vitals filed for this visit. BP Readings from Last 3 Encounters:  06/11/23 115/68  06/04/23 109/70  05/28/23 103/69   Wt Readings from Last 3  Encounters:  08/07/22 170 lb (77.1 kg)  06/17/22 171 lb (77.6 kg)  02/20/22 174 lb (78.9 kg)   There is no height or weight on file to calculate BMI.    Physical Exam     Lab Results  Component Value Date   WBC 9.4 12/16/2021   HGB 12.8 12/16/2021   HCT 37.0 12/16/2021   PLT 318.0 12/16/2021   GLUCOSE 89 03/06/2022   CHOL 235 (H) 12/16/2021   TRIG 317.0 (H) 12/16/2021   HDL 41.20 12/16/2021   LDLDIRECT 149.0 12/16/2021   LDLCALC  05/16/2020     Comment:     . LDL cholesterol not calculated. Triglyceride levels greater than 400 mg/dL invalidate calculated LDL results. . Reference range: <100 . Desirable range <100 mg/dL for primary prevention;   <70 mg/dL for patients with CHD or diabetic patients  with > or = 2 CHD risk factors. Marland Kitchen LDL-C is now calculated using the Martin-Hopkins  calculation, which is a validated novel method providing  better accuracy than the Friedewald equation in the  estimation of LDL-C.  Horald Pollen et al. Lenox Ahr. 4401;027(25): 2061-2068  (http://education.[facility].com/faq/FAQ164)    ALT 17 12/16/2021   AST 21 12/16/2021   NA 140 03/06/2022  K 4.2 03/06/2022   CL 105 03/06/2022   CREATININE 1.11 (H) 03/06/2022   BUN 24 03/06/2022   CO2 19 (L) 03/06/2022   TSH 2.22 12/16/2021   HGBA1C 5.7 12/16/2021     Assessment & Plan:    See Problem List for Assessment and Plan of chronic medical problems.

## 2023-06-15 NOTE — Patient Instructions (Addendum)
      Blood work was ordered.   The lab is on the first floor.    Medications changes include :   lotrisone cream twice a day to your mouth corners as needed      Return in about 6 months (around 12/14/2023) for follow up.

## 2023-06-16 ENCOUNTER — Encounter: Payer: Self-pay | Admitting: Internal Medicine

## 2023-06-16 ENCOUNTER — Ambulatory Visit: Payer: Medicare HMO | Admitting: Internal Medicine

## 2023-06-16 VITALS — BP 120/60 | HR 70 | Temp 97.9°F | Wt 173.6 lb

## 2023-06-16 DIAGNOSIS — F419 Anxiety disorder, unspecified: Secondary | ICD-10-CM | POA: Diagnosis not present

## 2023-06-16 DIAGNOSIS — R7303 Prediabetes: Secondary | ICD-10-CM | POA: Diagnosis not present

## 2023-06-16 DIAGNOSIS — E559 Vitamin D deficiency, unspecified: Secondary | ICD-10-CM

## 2023-06-16 DIAGNOSIS — N1831 Chronic kidney disease, stage 3a: Secondary | ICD-10-CM

## 2023-06-16 DIAGNOSIS — I2510 Atherosclerotic heart disease of native coronary artery without angina pectoris: Secondary | ICD-10-CM

## 2023-06-16 DIAGNOSIS — E7849 Other hyperlipidemia: Secondary | ICD-10-CM

## 2023-06-16 DIAGNOSIS — R32 Unspecified urinary incontinence: Secondary | ICD-10-CM | POA: Diagnosis not present

## 2023-06-16 DIAGNOSIS — F3289 Other specified depressive episodes: Secondary | ICD-10-CM

## 2023-06-16 DIAGNOSIS — K130 Diseases of lips: Secondary | ICD-10-CM | POA: Diagnosis not present

## 2023-06-16 LAB — CBC WITH DIFFERENTIAL/PLATELET
Basophils Absolute: 0.1 10*3/uL (ref 0.0–0.1)
Basophils Relative: 0.7 % (ref 0.0–3.0)
Eosinophils Absolute: 0.1 10*3/uL (ref 0.0–0.7)
Eosinophils Relative: 1.6 % (ref 0.0–5.0)
HCT: 36.6 % (ref 36.0–46.0)
Hemoglobin: 12.3 g/dL (ref 12.0–15.0)
Lymphocytes Relative: 28.3 % (ref 12.0–46.0)
Lymphs Abs: 2.6 10*3/uL (ref 0.7–4.0)
MCHC: 33.6 g/dL (ref 30.0–36.0)
MCV: 86.9 fl (ref 78.0–100.0)
Monocytes Absolute: 0.8 10*3/uL (ref 0.1–1.0)
Monocytes Relative: 8.7 % (ref 3.0–12.0)
Neutro Abs: 5.6 10*3/uL (ref 1.4–7.7)
Neutrophils Relative %: 60.7 % (ref 43.0–77.0)
Platelets: 317 10*3/uL (ref 150.0–400.0)
RBC: 4.21 Mil/uL (ref 3.87–5.11)
RDW: 13.7 % (ref 11.5–15.5)
WBC: 9.3 10*3/uL (ref 4.0–10.5)

## 2023-06-16 LAB — LDL CHOLESTEROL, DIRECT: Direct LDL: 156 mg/dL

## 2023-06-16 LAB — COMPREHENSIVE METABOLIC PANEL
ALT: 15 U/L (ref 0–35)
AST: 20 U/L (ref 0–37)
Albumin: 4.4 g/dL (ref 3.5–5.2)
Alkaline Phosphatase: 77 U/L (ref 39–117)
BUN: 23 mg/dL (ref 6–23)
CO2: 22 meq/L (ref 19–32)
Calcium: 10.1 mg/dL (ref 8.4–10.5)
Chloride: 105 meq/L (ref 96–112)
Creatinine, Ser: 1.14 mg/dL (ref 0.40–1.20)
GFR: 46.19 mL/min — ABNORMAL LOW (ref >60.00)
Glucose, Bld: 82 mg/dL (ref 70–99)
Potassium: 4.2 meq/L (ref 3.5–5.1)
Sodium: 138 meq/L (ref 135–145)
Total Bilirubin: 0.4 mg/dL (ref 0.2–1.2)
Total Protein: 8 g/dL (ref 6.0–8.3)

## 2023-06-16 LAB — LIPID PANEL
Cholesterol: 235 mg/dL — ABNORMAL HIGH (ref 0–200)
HDL: 36.3 mg/dL — ABNORMAL LOW (ref >39.00)
Total CHOL/HDL Ratio: 6
Triglycerides: 453 mg/dL — ABNORMAL HIGH (ref 0.0–149.0)

## 2023-06-16 LAB — HEMOGLOBIN A1C: Hgb A1c MFr Bld: 5.6 % (ref 4.6–6.5)

## 2023-06-16 MED ORDER — CETIRIZINE HCL 10 MG PO TABS: 10.0000 mg | ORAL_TABLET | Freq: Every day | ORAL | 0 refills | Status: DC

## 2023-06-16 MED ORDER — CITALOPRAM HYDROBROMIDE 20 MG PO TABS: 20.0000 mg | ORAL_TABLET | Freq: Every day | ORAL | 3 refills | Status: DC

## 2023-06-16 MED ORDER — CLOTRIMAZOLE-BETAMETHASONE 1-0.05 % EX CREA: TOPICAL_CREAM | CUTANEOUS | 2 refills | Status: AC

## 2023-06-16 NOTE — Assessment & Plan Note (Signed)
Chronic Taking vitamin D daily Check vitamin D level  

## 2023-06-16 NOTE — Assessment & Plan Note (Signed)
Chronic Following with urogynecology Just recently had PTNS x 3 On Gemtesa 75 mg daily

## 2023-06-16 NOTE — Assessment & Plan Note (Signed)
Chronic Regular exercise and healthy diet encouraged Check lipid panel  Continue Zetia 10 mg daily Stopped atrovastatin

## 2023-06-16 NOTE — Assessment & Plan Note (Signed)
Chronic Controlled, Stable Continue citalopram 20 mg daily 

## 2023-06-16 NOTE — Assessment & Plan Note (Signed)
Chronic Mild, stable CMP, CBC Advised increase water intake Avoid NSAIDs Low-sodium diet

## 2023-06-16 NOTE — Assessment & Plan Note (Addendum)
Chronic Fairly controlled, but she admits she still worries a lot Continue regular exercise Continue citalopram 20 mg daily

## 2023-06-16 NOTE — Assessment & Plan Note (Signed)
Chronic Check a1c Low sugar / carb diet Stressed regular exercise  

## 2023-06-16 NOTE — Assessment & Plan Note (Signed)
New Ongoing for 2 weeks Has been having dental work and has been drooling a lot which is likely the cause-will likely yeast related Start Lotrisone twice daily She will let me know if there is no improvement

## 2023-06-16 NOTE — Assessment & Plan Note (Signed)
Chronic Mild Following with cardiology On Zetia 10 mg daily - not taking atorvastatin Continue aspirin 81 mg daily

## 2023-06-17 LAB — VITAMIN D 25 HYDROXY (VIT D DEFICIENCY, FRACTURES): VITD: 39.63 ng/mL (ref 30.00–100.00)

## 2023-06-17 LAB — TSH: TSH: 2.27 u[IU]/mL (ref 0.35–5.50)

## 2023-06-18 ENCOUNTER — Encounter: Payer: Self-pay | Admitting: Obstetrics and Gynecology

## 2023-06-18 ENCOUNTER — Ambulatory Visit: Payer: Medicare HMO | Admitting: Obstetrics and Gynecology

## 2023-06-18 VITALS — BP 108/71 | HR 76

## 2023-06-18 DIAGNOSIS — N3281 Overactive bladder: Secondary | ICD-10-CM

## 2023-06-18 DIAGNOSIS — L900 Lichen sclerosus et atrophicus: Secondary | ICD-10-CM

## 2023-06-18 MED ORDER — CLOBETASOL PROPIONATE E 0.05 % EX CREA
1.0000 | TOPICAL_CREAM | Freq: Every evening | CUTANEOUS | 2 refills | Status: AC
Start: 2023-06-18 — End: ?

## 2023-06-18 NOTE — Progress Notes (Signed)
pt n/s

## 2023-06-18 NOTE — Progress Notes (Signed)
[facility]  PTNS VISIT  CC:  Overactive bladder  77 y.o. with refractory overactive bladder who presents for percutaneous tibial nerve stimulation. The patient presents for PTNS session # 4.   Procedure: The patient was placed in the sitting position and the left lower extremity was prepped in the usual fashion. The PTNS needle was then inserted at a 60 degree angle, 5 cm cephalad and 2 cm posterior to the medial malleolus. The PTNS unit was then programmed and an optimal response was noted at 9 milliamps. The PTNS stimulation was then performed at this setting for 30 minutes without incident and the patient tolerated the procedure well. The needle was removed and hemostasis was noted.   The pt will return in 1 week for PTNS session # 5. All questions were answered.

## 2023-06-18 NOTE — Progress Notes (Signed)
[facility] Return Visit  SUBJECTIVE  History of Present Illness: Pam Butler is a 77 y.o. female seen for vaginal itching.     Past Medical History: Patient  has a past medical history of Allergy, Anxiety, Cataract, Depression, History of bloody stools, History of muscle pain, and Loose stools.   Past Surgical History: She  has a past surgical history that includes Abdominal hysterectomy; Cholecystectomy; Tubal ligation; Breast biopsy (Right, 2001); Colonoscopy; Appendectomy; and Hand surgery (Left).   Medications: She has a current medication list which includes the following prescription(s): acetaminophen, aspirin ec, atorvastatin, calcium carbonate, cetirizine, vitamin d, citalopram, clobetasol propionate e, clotrimazole-betamethasone, collagen, doxepin, ezetimibe, gemtesa, melatonin, multiple vitamins-minerals, and turmeric.   Allergies: Patient is allergic to latex, penicillins, ciprofloxacin, codeine, and crestor [rosuvastatin calcium].   Social History: Patient  reports that she has never smoked. She has never used smokeless tobacco. She reports that she does not drink alcohol and does not use drugs.      OBJECTIVE     Physical Exam: Vitals:   06/18/23 0955  BP: 108/71  Pulse: 76   Gen: No apparent distress, A&O x 3.  Detailed Urogynecologic Evaluation:  Patient has shiny and pale skin that is starting to agglutinate at the posterior fourchette and around the anterior vaginal tissue. Clinically correlates with Lichens.    ASSESSMENT AND PLAN    Ms. Spatafora is a 77 y.o. with:  1. Lichen sclerosus    Will start patient on clobetasol cream for Lichen's around the vaginal region. She is aware to do one application of cream nightly.   Patient will be seeing me for PTNS for the next 7 weeks, will take a look at the area in approximately 4 weeks to see progress.

## 2023-06-25 ENCOUNTER — Encounter: Payer: Self-pay | Admitting: Obstetrics and Gynecology

## 2023-06-25 ENCOUNTER — Ambulatory Visit: Payer: Medicare HMO | Admitting: Obstetrics and Gynecology

## 2023-06-25 VITALS — BP 128/81 | HR 69

## 2023-06-25 DIAGNOSIS — N3281 Overactive bladder: Secondary | ICD-10-CM | POA: Diagnosis not present

## 2023-06-25 DIAGNOSIS — R32 Unspecified urinary incontinence: Secondary | ICD-10-CM

## 2023-06-25 NOTE — Progress Notes (Signed)
[facility]  PTNS VISIT  CC:  Overactive bladder  77 y.o. with refractory overactive bladder who presents for percutaneous tibial nerve stimulation. The patient presents for PTNS session # 5.   Procedure: The patient was placed in the sitting position and the right lower extremity was prepped in the usual fashion. The PTNS needle was then inserted at a 60 degree angle, 5 cm cephalad and 2 cm posterior to the medial malleolus. The PTNS unit was then programmed and an optimal response was noted at 8 milliamps. The PTNS stimulation was then performed at this setting for 30 minutes without incident and the patient tolerated the procedure well. The needle was removed and hemostasis was noted.   The pt will return in 1 week for PTNS session # 6. All questions were answered.

## 2023-06-30 DIAGNOSIS — T1512XA Foreign body in conjunctival sac, left eye, initial encounter: Secondary | ICD-10-CM | POA: Diagnosis not present

## 2023-06-30 DIAGNOSIS — H1132 Conjunctival hemorrhage, left eye: Secondary | ICD-10-CM | POA: Diagnosis not present

## 2023-07-02 ENCOUNTER — Ambulatory Visit: Payer: Medicare HMO | Admitting: Obstetrics and Gynecology

## 2023-07-02 ENCOUNTER — Encounter: Payer: Self-pay | Admitting: Obstetrics and Gynecology

## 2023-07-02 VITALS — BP 128/70 | HR 63

## 2023-07-02 DIAGNOSIS — N393 Stress incontinence (female) (male): Secondary | ICD-10-CM

## 2023-07-02 DIAGNOSIS — R159 Full incontinence of feces: Secondary | ICD-10-CM | POA: Diagnosis not present

## 2023-07-02 DIAGNOSIS — N3281 Overactive bladder: Secondary | ICD-10-CM | POA: Diagnosis not present

## 2023-07-02 DIAGNOSIS — R152 Fecal urgency: Secondary | ICD-10-CM | POA: Diagnosis not present

## 2023-07-02 DIAGNOSIS — M6289 Other specified disorders of muscle: Secondary | ICD-10-CM

## 2023-07-02 MED ORDER — TROSPIUM CHLORIDE 20 MG PO TABS: 20.0000 mg | ORAL_TABLET | Freq: Two times a day (BID) | ORAL | 5 refills | Status: DC

## 2023-07-02 NOTE — Progress Notes (Signed)
[facility]  PTNS VISIT  CC:  Overactive bladder  77 y.o. with refractory overactive bladder who presents for percutaneous tibial nerve stimulation. The patient presents for PTNS session # 6.    Procedure: The patient was placed in the sitting position and the right lower extremity was prepped in the usual fashion. The PTNS needle was then inserted at a 60 degree angle, 5 cm cephalad and 2 cm posterior to the medial malleolus. The PTNS unit was then programmed and an optimal response was noted at 6 milliamps. The PTNS stimulation was then performed at this setting for 30 minutes without incident and the patient tolerated the procedure well. The needle was removed and hemostasis was noted.    We discussed that she feels her most bothersome problem is her FI. We discussed that potentially doing formal pelvic floor may be more helpful for this aspect of her problem vs. The PTNS. She reports she is willing to try pelvic floor PT.   Patient is also open to adding back in a medication. Trospium 20mg  x2 daily is estimated to be a 1$ cost to patient is is one of the few medications I would consider adding at this time. Will try Trospium 20mg  x2 daily.     The pt will return in 1 week for PTNS session # 7. All questions were answered.

## 2023-07-09 ENCOUNTER — Encounter: Payer: Self-pay | Admitting: Obstetrics and Gynecology

## 2023-07-09 ENCOUNTER — Ambulatory Visit: Payer: Medicare HMO | Admitting: Obstetrics and Gynecology

## 2023-07-09 VITALS — BP 113/64 | HR 70

## 2023-07-09 DIAGNOSIS — N3281 Overactive bladder: Secondary | ICD-10-CM | POA: Diagnosis not present

## 2023-07-09 NOTE — Progress Notes (Signed)
[facility]  PTNS VISIT  CC:  Overactive bladder  77 y.o. with refractory overactive bladder who presents for percutaneous tibial nerve stimulation. The patient presents for PTNS session # 7.   Procedure: The patient was placed in the sitting position and the right lower extremity was prepped in the usual fashion. The PTNS needle was then inserted at a 60 degree angle, 5 cm cephalad and 2 cm posterior to the medial malleolus. The PTNS unit was then programmed and an optimal response was noted at 6 milliamps. The PTNS stimulation was then performed at this setting for 30 minutes without incident and the patient tolerated the procedure well. The needle was removed and hemostasis was noted.   The pt will return in 1 week for PTNS session # 8. All questions were answered.

## 2023-07-16 ENCOUNTER — Encounter: Payer: Self-pay | Admitting: Obstetrics and Gynecology

## 2023-07-16 ENCOUNTER — Ambulatory Visit: Payer: Medicare HMO | Admitting: Obstetrics and Gynecology

## 2023-07-16 VITALS — BP 110/66 | HR 68

## 2023-07-16 DIAGNOSIS — N3281 Overactive bladder: Secondary | ICD-10-CM

## 2023-07-16 DIAGNOSIS — R32 Unspecified urinary incontinence: Secondary | ICD-10-CM

## 2023-07-16 NOTE — Progress Notes (Signed)
[facility]  PTNS VISIT  CC:  Overactive bladder  77 y.o. with refractory overactive bladder who presents for percutaneous tibial nerve stimulation. The patient presents for PTNS session # 8.   Patient has brought her bladder diary. She is still having urgency but is going less frequency. She reports that she is still having loss of urine after sleeping.   Procedure: The patient was placed in the sitting position and the left lower extremity was prepped in the usual fashion. The PTNS needle was then inserted at a 60 degree angle, 5 cm cephalad and 2 cm posterior to the medial malleolus. The PTNS unit was then programmed and an optimal response was noted at 6 milliamps. The PTNS stimulation was then performed at this setting for 30 minutes without incident and the patient tolerated the procedure well. The needle was removed and hemostasis was noted.   The pt will return in 1 week for PTNS session # 9. All questions were answered.

## 2023-07-23 ENCOUNTER — Encounter: Payer: Self-pay | Admitting: Obstetrics and Gynecology

## 2023-07-23 ENCOUNTER — Ambulatory Visit: Payer: Medicare HMO | Admitting: Obstetrics and Gynecology

## 2023-07-23 VITALS — BP 118/77 | HR 73

## 2023-07-23 DIAGNOSIS — N3281 Overactive bladder: Secondary | ICD-10-CM

## 2023-07-23 NOTE — Progress Notes (Signed)
[facility]  PTNS VISIT  CC:  Overactive bladder  77 y.o. with refractory overactive bladder who presents for percutaneous tibial nerve stimulation. The patient presents for PTNS session # 9  This last week she has been having more urgency and leakage. Not sure if it is working well for her.   Procedure: The patient was placed in the sitting position and the right lower extremity was prepped in the usual fashion. The PTNS needle was then inserted at a 60 degree angle, 5 cm cephalad and 2 cm posterior to the medial malleolus. The PTNS unit was then programmed and an optimal response was noted at 5 milliamps. The PTNS stimulation was then performed at this setting for 30 minutes without incident and the patient tolerated the procedure well. The needle was removed and hemostasis was noted.   The pt will return in 1 week for PTNS session # 10. All questions were answered.

## 2023-07-30 ENCOUNTER — Ambulatory Visit: Payer: Medicare HMO | Admitting: Obstetrics and Gynecology

## 2023-07-30 ENCOUNTER — Encounter: Payer: Self-pay | Admitting: Obstetrics and Gynecology

## 2023-07-30 VITALS — BP 124/74 | HR 56

## 2023-07-30 DIAGNOSIS — N3281 Overactive bladder: Secondary | ICD-10-CM

## 2023-07-30 DIAGNOSIS — R32 Unspecified urinary incontinence: Secondary | ICD-10-CM

## 2023-07-30 NOTE — Progress Notes (Signed)
[facility]  PTNS VISIT  CC:  Overactive bladder  77 y.o. with refractory overactive bladder who presents for percutaneous tibial nerve stimulation. The patient presents for PTNS session # 10.   Procedure: The patient was placed in the sitting position and the right lower extremity was prepped in the usual fashion. The PTNS needle was then inserted at a 60 degree angle, 5 cm cephalad and 2 cm posterior to the medial malleolus. The PTNS unit was then programmed and an optimal response was noted at 8 milliamps. The PTNS stimulation was then performed at this setting for 30 minutes without incident and the patient tolerated the procedure well. The needle was removed and hemostasis was noted.   The pt will return in 1 week for PTNS session # 11. All questions were answered.

## 2023-08-06 ENCOUNTER — Ambulatory Visit: Payer: Medicare HMO | Admitting: Obstetrics and Gynecology

## 2023-08-06 ENCOUNTER — Encounter: Payer: Self-pay | Admitting: Obstetrics and Gynecology

## 2023-08-06 VITALS — BP 144/76 | HR 73

## 2023-08-06 DIAGNOSIS — N3281 Overactive bladder: Secondary | ICD-10-CM | POA: Diagnosis not present

## 2023-08-06 DIAGNOSIS — R32 Unspecified urinary incontinence: Secondary | ICD-10-CM

## 2023-08-06 NOTE — Progress Notes (Signed)
[facility]  PTNS VISIT  CC:  Overactive bladder  77 y.o. with refractory overactive bladder who presents for percutaneous tibial nerve stimulation. The patient presents for PTNS session # 11.   Procedure: The patient was placed in the sitting position and the right lower extremity was prepped in the usual fashion. The PTNS needle was then inserted at a 60 degree angle, 5 cm cephalad and 2 cm posterior to the medial malleolus. The PTNS unit was then programmed and an optimal response was noted at 7 milliamps. The PTNS stimulation was then performed at this setting for 30 minutes without incident and the patient tolerated the procedure well. The needle was removed and hemostasis was noted.   The pt will return in 1 week for PTNS session # 12. All questions were answered.

## 2023-08-13 ENCOUNTER — Ambulatory Visit: Payer: Medicare HMO | Admitting: Obstetrics and Gynecology

## 2023-08-13 ENCOUNTER — Encounter: Payer: Self-pay | Admitting: Obstetrics and Gynecology

## 2023-08-13 VITALS — BP 128/81 | HR 69

## 2023-08-13 DIAGNOSIS — N3281 Overactive bladder: Secondary | ICD-10-CM | POA: Diagnosis not present

## 2023-08-13 DIAGNOSIS — R32 Unspecified urinary incontinence: Secondary | ICD-10-CM

## 2023-08-13 NOTE — Progress Notes (Signed)
[facility]  PTNS VISIT  CC:  Overactive bladder  77 y.o. with refractory overactive bladder who presents for percutaneous tibial nerve stimulation. The patient presents for PTNS session # 12.   Procedure: The patient was placed in the sitting position and the right lower extremity was prepped in the usual fashion. The PTNS needle was then inserted at a 60 degree angle, 5 cm cephalad and 2 cm posterior to the medial malleolus. The PTNS unit was then programmed and an optimal response was noted at 9 milliamps. The PTNS stimulation was then performed at this setting for 30 minutes without incident and the patient tolerated the procedure well. The needle was removed and hemostasis was noted.

## 2023-08-28 DIAGNOSIS — H5203 Hypermetropia, bilateral: Secondary | ICD-10-CM | POA: Diagnosis not present

## 2023-08-28 DIAGNOSIS — H524 Presbyopia: Secondary | ICD-10-CM | POA: Diagnosis not present

## 2023-08-28 DIAGNOSIS — H52223 Regular astigmatism, bilateral: Secondary | ICD-10-CM | POA: Diagnosis not present

## 2023-08-28 DIAGNOSIS — H1045 Other chronic allergic conjunctivitis: Secondary | ICD-10-CM | POA: Diagnosis not present

## 2023-08-28 DIAGNOSIS — H2513 Age-related nuclear cataract, bilateral: Secondary | ICD-10-CM | POA: Diagnosis not present

## 2023-08-28 DIAGNOSIS — H04123 Dry eye syndrome of bilateral lacrimal glands: Secondary | ICD-10-CM | POA: Diagnosis not present

## 2023-09-10 ENCOUNTER — Other Ambulatory Visit: Payer: Self-pay | Admitting: Internal Medicine

## 2023-09-19 NOTE — Progress Notes (Unsigned)
Cardiology Office Note:   Date:  09/19/2023  ID:  Pam Butler, DOB 1945-10-26, MRN [id number] PCP:  Pincus Sanes, MD  [facility] Providers Cardiologist:  Alverda Skeans, MD Referring MD: Pincus Sanes, MD  Chief Complaint/Reason for Referral:  Cardiology follow up ASSESSMENT:    1. Mild CAD   2. Hyperlipidemia LDL goal <70   3. Stage 3a chronic kidney disease (HCC)   4. BMI 31.0-31.9,adult     PLAN:   In order of problems listed above: Mild CAD:  Continue aspirin 81mg ; will refer to PharmD for lipid therapy. Hyperlipidemia:  Intolerant of both Crestor and Lipitor; check lipids and Lp(a) today; refer to PharmD. CKD stage IIIa:  Monitor; if becomes hypertensive, consider ARB. Elevated BMI:  Diet and exercise; consider GLP1RA if develops diagnosis of T2DM.        {Are you ordering a CV Procedure (e.g. stress test, cath, DCCV, TEE, etc)?   Press F2        :[id number]}   Dispo:  No follow-ups on file.      Medication Adjustments/Labs and Tests Ordered: Current medicines are reviewed at length with the patient today.  Concerns regarding medicines are outlined above.  The following changes have been made:  {PLAN; NO CHANGE:13088:s}   Labs/tests ordered: No orders of the defined types were placed in this encounter.   Medication Changes: No orders of the defined types were placed in this encounter.   Current medicines are reviewed at length with the patient today.  The patient {ACTIONS; HAS/DOES NOT HAVE:19233} concerns regarding medicines.  I spent *** minutes reviewing all clinical data during and prior to this visit including all relevant imaging studies, laboratories, clinical information from other health systems and prior notes from both Cardiology and other specialties, interviewing the patient, conducting a complete physical examination, and coordinating care in order to formulate a comprehensive and personalized evaluation and treatment plan.   History of Present  Illness:      FOCUSED PROBLEM LIST:   CAD Mild, coronary CTA 2023 Aortic atherosclerosis Coronary CTA 2023 Hyperlipidemia Intolerant of Crestor and Atorvasatin CKD stage IIIa BMI 10 January 2022 consultation: The patient is a 77 y.o. female with the indicated medical history here for recommendations regarding chest pain and shortness of breath.  The patient was seen by her primary care provider recently.  She reported dyspnea on exertion which has been a chronic issue.  She also reported chest pain with exertion.  Her dyspnea with exertion has been worse since her COVID infection last year.  She gets occasional chest tightness with activity that improves with rest.  She was noted to be still grieving over her mother's death as well as dealing with anxiety and depressive issues.   The patient tells me that she will get chest pain when she exerts herself.  When she rests it would go away.  She will take a Tums when she gets the chest pain and will take about 20 or 30 minutes for it to abate.  She has been short of breath with exertion as well.  Shortness of breath has been worse since she contracted COVID.  She denies any peripheral edema, paroxysmal nocturnal dyspnea, orthopnea, presyncope or syncope.  She has had no severe bleeding episodes.  She is required no emergency room visits or hospitalizations.  Plan:  Coronary CTA   10/23:  In the interim, the patient had a coronary CTA which showed only mild disease.  She is still  plagued with shortness of breath that is unchanged and.  Only after COVID.  She denies any significant chest pains.  Most of her complaints are due to rheumatologic issues.  She fortunately has not required any emergency room visits or hospitalizations.  She did not start aspirin or atorvastatin as I instructed previously.  Plan:  Start atorvastatin 20mg , check lipid panel, LFTs, and Lp(a) in 2 months.   12/24:  Her LDL was 156 in 9/24; no Lp(a) was checked.  She was seen by her  PCP in September.  She had stopped her atorvastatin.          Current Medications: No outpatient medications have been marked as taking for the 09/23/23 encounter (Appointment) with Orbie Pyo, MD.     Review of Systems:   Please see the history of present illness.    All other systems reviewed and are negative.     EKGs/Labs/Other Test Reviewed:   EKG:  EKG from 3/23 shows sinus rhythm  EKG Interpretation Date/Time:    Ventricular Rate:    PR Interval:    QRS Duration:    QT Interval:    QTC Calculation:   R Axis:      Text Interpretation:           Risk Assessment/Calculations:   {Does this patient have ATRIAL FIBRILLATION?[phone]}      Physical Exam:   VS:  There were no vitals taken for this visit.   No BP recorded.  {Refresh Note OR Click here to enter BP  :1}***   Wt Readings from Last 3 Encounters:  06/16/23 173 lb 9.6 oz (78.7 kg)  08/07/22 170 lb (77.1 kg)  06/17/22 171 lb (77.6 kg)      GENERAL:  No apparent distress, AOx3 HEENT:  No carotid bruits, +2 carotid impulses, no scleral icterus CAR: RRR Irregular RR*** no murmurs***, gallops, rubs, or thrills RES:  Clear to auscultation bilaterally ABD:  Soft, nontender, nondistended, positive bowel sounds x 4 VASC:  +2 radial pulses, +2 carotid pulses NEURO:  CN 2-12 grossly intact; motor and sensory grossly intact PSYCH:  No active depression or anxiety EXT:  No edema, ecchymosis, or cyanosis  Signed, Orbie Pyo, MD  09/19/2023 8:40 AM    [facility] [address] [place], [place], [place]  [address] Phone: [phone]; Fax: [phone]   Note:  This document was prepared using Dragon voice recognition software and may include unintentional dictation errors.

## 2023-09-23 ENCOUNTER — Ambulatory Visit: Payer: Medicare HMO | Attending: Internal Medicine | Admitting: Internal Medicine

## 2023-09-23 ENCOUNTER — Encounter: Payer: Self-pay | Admitting: Internal Medicine

## 2023-09-23 VITALS — BP 136/72 | HR 68 | Ht 62.0 in | Wt 170.0 lb

## 2023-09-23 DIAGNOSIS — Z6831 Body mass index (BMI) 31.0-31.9, adult: Secondary | ICD-10-CM | POA: Diagnosis not present

## 2023-09-23 DIAGNOSIS — N1831 Chronic kidney disease, stage 3a: Secondary | ICD-10-CM

## 2023-09-23 DIAGNOSIS — E785 Hyperlipidemia, unspecified: Secondary | ICD-10-CM | POA: Diagnosis not present

## 2023-09-23 DIAGNOSIS — I2510 Atherosclerotic heart disease of native coronary artery without angina pectoris: Secondary | ICD-10-CM

## 2023-09-23 NOTE — Patient Instructions (Addendum)
Medication Instructions:  Your physician has recommended you make the following change in your medication:   1) RESTART asiprin 81 mg daily  *If you need a refill on your cardiac medications before your next appointment, please call your pharmacy*  Lab Work: TODAY: lipid panel, LP(a) If you have labs (blood work) drawn today and your tests are completely normal, you will receive your results only by: MyChart Message (if you have MyChart) OR A paper copy in the mail If you have any lab test that is abnormal or we need to change your treatment, we will call you to review the results.  Testing/Procedures: None ordered today.  Follow-Up: At [facility], you and your health needs are our priority.  As part of our continuing mission to provide you with exceptional heart care, we have created designated Provider Care Teams.  These Care Teams include your primary Cardiologist (physician) and Advanced Practice Providers (APPs -  Physician Assistants and Nurse Practitioners) who all work together to provide you with the care you need, when you need it.  We recommend signing up for the patient portal called "MyChart".  Sign up information is provided on this After Visit Summary.  MyChart is used to connect with patients for Virtual Visits (Telemedicine).  Patients are able to view lab/test results, encounter notes, upcoming appointments, etc.  Non-urgent messages can be sent to your provider as well.   To learn more about what you can do with MyChart, go to [email or web address].    Your next appointment:   1 year(s)  The format for your next appointment:   In Person  Provider:   Jari Favre, PA-C, Ronie Spies, PA-C, Robin Searing, NP, Tereso Newcomer, PA-C, or Perlie Gold, PA-C

## 2023-09-25 LAB — LIPID PANEL
Chol/HDL Ratio: 5.8 {ratio} — ABNORMAL HIGH (ref 0.0–4.4)
Cholesterol, Total: 244 mg/dL — ABNORMAL HIGH (ref 100–199)
HDL: 42 mg/dL (ref >39)
LDL Chol Calc (NIH): 149 mg/dL — ABNORMAL HIGH (ref 0–99)
Triglycerides: 289 mg/dL — ABNORMAL HIGH (ref 0–149)
VLDL Cholesterol Cal: 53 mg/dL — ABNORMAL HIGH (ref 5–40)

## 2023-09-25 LAB — LIPOPROTEIN A (LPA): Lipoprotein (a): 32.2 nmol/L (ref <75.0)

## 2023-09-28 ENCOUNTER — Telehealth: Payer: Self-pay | Admitting: *Deleted

## 2023-09-28 DIAGNOSIS — E785 Hyperlipidemia, unspecified: Secondary | ICD-10-CM

## 2023-09-28 DIAGNOSIS — Z789 Other specified health status: Secondary | ICD-10-CM

## 2023-09-28 NOTE — Telephone Encounter (Signed)
Called and spoke w patient.  Adv of results and recommendations.  She is not on any statins currently only taking Zetia.  In agreement to Lipid Clinic appointment and is aware she will be contacted to arrange.   Referral placed.

## 2023-10-16 ENCOUNTER — Ambulatory Visit: Payer: Medicare HMO

## 2023-10-16 VITALS — BP 140/82 | HR 66 | Ht 61.75 in | Wt 175.4 lb

## 2023-10-16 DIAGNOSIS — Z1231 Encounter for screening mammogram for malignant neoplasm of breast: Secondary | ICD-10-CM

## 2023-10-16 DIAGNOSIS — Z780 Asymptomatic menopausal state: Secondary | ICD-10-CM | POA: Diagnosis not present

## 2023-10-16 DIAGNOSIS — Z1212 Encounter for screening for malignant neoplasm of rectum: Secondary | ICD-10-CM

## 2023-10-16 DIAGNOSIS — Z1211 Encounter for screening for malignant neoplasm of colon: Secondary | ICD-10-CM

## 2023-10-16 DIAGNOSIS — Z0000 Encounter for general adult medical examination without abnormal findings: Secondary | ICD-10-CM

## 2023-10-16 NOTE — Progress Notes (Addendum)
 Subjective:   Pam Butler is a 78 y.o. female who presents for Medicare Annual (Subsequent) preventive examination.  Visit Complete: In person   Cardiac Risk Factors include: advanced age (>40men, >62 women);Other (see comment);dyslipidemia;obesity (BMI >30kg/m2), Risk factor comments: CAD, CKD,Thrombophlebitis     Objective:    Today's Vitals   10/16/23 1243  BP: (!) 140/82  Pulse: 66  SpO2: 100%  Weight: 175 lb 6.4 oz (79.6 kg)  Height: 5' 1.75 (1.568 m)   Body mass index is 32.34 kg/m.     10/16/2023   11:00 AM 11/13/2020   11:03 AM 03/25/2019    1:29 PM 03/18/2018    2:46 PM 01/29/2016    9:30 AM  Advanced Directives  Does Patient Have a Medical Advance Directive? No No No No No  Would patient like information on creating a medical advance directive? Yes (MAU/Ambulatory/Procedural Areas - Information given) Yes (MAU/Ambulatory/Procedural Areas - Information given) No - Patient declined Yes (ED - Information included in AVS)     Current Medications (verified) Outpatient Encounter Medications as of 10/16/2023  Medication Sig   Acetaminophen (TYLENOL EXTRA STRENGTH PO) Take 1 tablet by mouth every evening. Take one prn   aspirin  EC 81 MG tablet Take 1 tablet (81 mg total) by mouth daily. Swallow whole.   atorvastatin  (LIPITOR) 40 MG tablet Take 1 tablet (40 mg total) by mouth daily.   calcium  carbonate (CALCIUM  600) 600 MG TABS tablet Take 1 tablet (600 mg total) by mouth daily with breakfast.   cetirizine  (ZYRTEC ) 10 MG tablet TAKE 1 TABLET BY MOUTH EVERY DAY   Cholecalciferol (VITAMIN D ) 50 MCG (2000 UT) CAPS 1 cap daily   citalopram  (CELEXA ) 20 MG tablet Take 1 tablet (20 mg total) by mouth daily.   Clobetasol  Prop Emollient Base (CLOBETASOL  PROPIONATE E) 0.05 % emollient cream Apply 1 Application topically at bedtime.   clotrimazole -betamethasone  (LOTRISONE ) cream Apply bid to corners of mouth   COLLAGEN PO Take 1 tablet by mouth daily.   doxepin (SINEQUAN) 10 MG capsule  Take 10 mg by mouth at bedtime.   ezetimibe  (ZETIA ) 10 MG tablet TAKE 1 TABLET BY MOUTH EVERY DAY   Melatonin 10 MG CAPS Take 1 tablet by mouth as needed (sleep).   Multiple Vitamins-Minerals (WOMENS MULTI VITAMIN & MINERAL PO) Take 1 tablet by mouth daily. Centrum Womens MVI-Take one daily   triamcinolone  cream (KENALOG) 0.1 % Apply 1 Application topically 2 (two) times daily.   trospium  (SANCTURA ) 20 MG tablet Take 1 tablet (20 mg total) by mouth 2 (two) times daily.   TURMERIC PO Take 1 tablet by mouth daily.   No facility-administered encounter medications on file as of 10/16/2023.    Allergies (verified) Latex, Penicillins, Ciprofloxacin, Codeine, and Crestor  [rosuvastatin  calcium ]   History: Past Medical History:  Diagnosis Date   Allergy    Anxiety    Cataract    Bil   Depression    History of bloody stools    in past/   History of muscle pain    Bil pain in bil arms and shoulders/ chronic since last year/left shoulder started this year   Loose stools    on and off/occasional blood in stool   Past Surgical History:  Procedure Laterality Date   ABDOMINAL HYSTERECTOMY     with bladder tack   APPENDECTOMY     BREAST BIOPSY Right 2001   benign   CHOLECYSTECTOMY     COLONOSCOPY     HAND SURGERY  Left    TUBAL LIGATION     Family History  Problem Relation Age of Onset   Other Mother        Prediabetes   Colitis Mother    Pneumonia Father    Arthritis Sister    Heart attack Maternal Uncle 16   Heart attack Paternal Grandmother 37   Colon cancer Neg Hx    Social History   Socioeconomic History   Marital status: Married    Spouse name: Christopher   Number of children: 2   Years of education: Not on file   Highest education level: Not on file  Occupational History   Occupation: retired  Tobacco Use   Smoking status: Never   Smokeless tobacco: Never  Vaping Use   Vaping status: Never Used  Substance and Sexual Activity   Alcohol use: No    Alcohol/week: 0.0  standard drinks of alcohol   Drug use: No   Sexual activity: Yes    Birth control/protection: Surgical  Other Topics Concern   Not on file  Social History Narrative   Exercise: none   Lives with husband   Social Drivers of Corporate Investment Banker Strain: Low Risk  (10/16/2023)   Overall Financial Resource Strain (CARDIA)    Difficulty of Paying Living Expenses: Not hard at all  Food Insecurity: No Food Insecurity (10/16/2023)   Hunger Vital Sign    Worried About Running Out of Food in the Last Year: Never true    Ran Out of Food in the Last Year: Never true  Transportation Needs: No Transportation Needs (10/16/2023)   PRAPARE - Administrator, Civil Service (Medical): No    Lack of Transportation (Non-Medical): No  Physical Activity: Inactive (10/16/2023)   Exercise Vital Sign    Days of Exercise per Week: 0 days    Minutes of Exercise per Session: 0 min  Stress: Stress Concern Present (10/16/2023)   [employer] of Occupational Health - Occupational Stress Questionnaire    Feeling of Stress : To some extent  Social Connections: Moderately Isolated (10/16/2023)   Social Connection and Isolation Panel [NHANES]    Frequency of Communication with Friends and Family: Three times a week    Frequency of Social Gatherings with Friends and Family: Never    Attends Religious Services: Never    Database Administrator or Organizations: No    Attends Engineer, Structural: Never    Marital Status: Married    Tobacco Counseling Counseling given: Not Answered   Clinical Intake:  Pre-visit preparation completed: Yes  Pain : No/denies pain     BMI - recorded: 32.34 Nutritional Status: BMI > 30  Obese Nutritional Risks: Nausea/ vomitting/ diarrhea Diabetes: No  How often do you need to have someone help you when you read instructions, pamphlets, or other written materials from your doctor or pharmacy?: 1 - Never  Interpreter Needed?: No  Information entered  by :: Kyrah Schiro, RMA   Activities of Daily Living    10/16/2023   10:52 AM  In your present state of health, do you have any difficulty performing the following activities:  Hearing? 1  Comment Per pt some  Vision? 0  Difficulty concentrating or making decisions? 0  Walking or climbing stairs? 0  Dressing or bathing? 0  Doing errands, shopping? 0  Preparing Food and eating ? N  Using the Toilet? N  In the past six months, have you accidently leaked urine? Y  Do you have problems with loss of bowel control? Y  Managing your Medications? N  Managing your Finances? N  Housekeeping or managing your Housekeeping? N    Patient Care Team: Geofm Glade PARAS, MD as PCP - General (Internal Medicine) Thukkani, Arun K, MD as PCP - Cardiology (Cardiology) Fate Morna SAILOR, [facility] (Inactive) as Pharmacist (Pharmacist) Camillo Golas, MD as Attending Physician (Ophthalmology)  Indicate any recent Medical Services you may have received from other than [facility] providers in the past year (date may be approximate).     Assessment:   This is a routine wellness examination for Pam Butler.  Hearing/Vision screen Hearing Screening - Comments:: Denies hearing difficulties   Vision Screening - Comments:: Denies vision issues.    Goals Addressed   None   Depression Screen    10/16/2023   11:14 AM 06/16/2023    1:38 PM 06/17/2022    2:07 PM 06/17/2022    1:55 PM 05/27/2021    1:27 PM 05/15/2020    4:52 PM 03/25/2019    1:52 PM  PHQ 2/9 Scores  PHQ - 2 Score 1 4 0 0 4 4 1   PHQ- 9 Score 8 12   16 20 3     Fall Risk    10/16/2023   11:01 AM 06/16/2023    1:41 PM 06/17/2022    2:07 PM 06/17/2022    1:55 PM 05/27/2021    1:07 PM  Fall Risk   Falls in the past year? 0 0 1 1 0  Number falls in past yr: 0 0 0 0 0  Injury with Fall? 0 0 0 0 0  Risk for fall due to : No Fall Risks No Fall Risks  No Fall Risks No Fall Risks  Follow up Falls prevention discussed;Falls evaluation completed Falls evaluation completed   Falls evaluation completed Falls evaluation completed    MEDICARE RISK AT HOME: Medicare Risk at Home Any stairs in or around the home?: Yes (2 steps) If so, are there any without handrails?: No Home free of loose throw rugs in walkways, pet beds, electrical cords, etc?: Yes Adequate lighting in your home to reduce risk of falls?: Yes Life alert?: No Use of a cane, walker or w/c?: No Grab bars in the bathroom?: No Shower chair or bench in shower?: No Elevated toilet seat or a handicapped toilet?: No  TIMED UP AND GO:  Was the test performed?  Yes  Length of time to ambulate 10 feet: 20 sec Gait slow and steady without use of assistive device    Cognitive Function:        10/16/2023   11:02 AM  6CIT Screen  What Year? 0 points  What month? 0 points  What time? 0 points  Count back from 20 0 points  Months in reverse 0 points  Repeat phrase 0 points  Total Score 0 points    Immunizations Immunization History  Administered Date(s) Administered   Fluad Quad(high Dose 65+) 08/23/2019   Influenza Whole 07/06/2008, 07/19/2010   Influenza, High Dose Seasonal PF 07/13/2018   Influenza,inj,Quad PF,6+ Mos 08/09/2016   PFIZER(Purple Top)SARS-COV-2 Vaccination 12/12/2019, 01/11/2020   Pneumococcal Conjugate-13 11/12/2016   Pneumococcal Polysaccharide-23 01/11/2018   Td 06/30/2005   Zoster Recombinant(Shingrix) 06/09/2023    TDAP status: Due, Education has been provided regarding the importance of this vaccine. Advised may receive this vaccine at local pharmacy or Health Dept. Aware to provide a copy of the vaccination record if obtained from local pharmacy or Health Dept.  Verbalized acceptance and understanding.  Flu Vaccine status: Due, Education has been provided regarding the importance of this vaccine. Advised may receive this vaccine at local pharmacy or Health Dept. Aware to provide a copy of the vaccination record if obtained from local pharmacy or Health Dept. Verbalized  acceptance and understanding.  Pneumococcal vaccine status: Up to date  Covid-19 vaccine status: Declined, Education has been provided regarding the importance of this vaccine but patient still declined. Advised may receive this vaccine at local pharmacy or Health Dept.or vaccine clinic. Aware to provide a copy of the vaccination record if obtained from local pharmacy or Health Dept. Verbalized acceptance and understanding.  Qualifies for Shingles Vaccine? Yes   Zostavax completed Yes   Shingrix Completed?: No.    Education has been provided regarding the importance of this vaccine. Patient has been advised to call insurance company to determine out of pocket expense if they have not yet received this vaccine. Advised may also receive vaccine at local pharmacy or Health Dept. Verbalized acceptance and understanding.  Screening Tests Health Maintenance  Topic Date Due   DTaP/Tdap/Td (2 - Tdap) 07/01/2015   DEXA SCAN  02/17/2021   COVID-19 Vaccine (3 - 2024-25 season) 06/14/2023   Zoster Vaccines- Shingrix (2 of 2) 08/04/2023   INFLUENZA VACCINE  01/11/2024 (Originally 05/14/2023)   Colonoscopy  03/23/2024   Medicare Annual Wellness (AWV)  10/15/2024   Pneumonia Vaccine 60+ Years old  Completed   Hepatitis C Screening  Completed   HPV VACCINES  Aged Out    Health Maintenance  Health Maintenance Due  Topic Date Due   DTaP/Tdap/Td (2 - Tdap) 07/01/2015   DEXA SCAN  02/17/2021   COVID-19 Vaccine (3 - 2024-25 season) 06/14/2023   Zoster Vaccines- Shingrix (2 of 2) 08/04/2023    Colorectal cancer screening: Referral to GI placed 10/16/2023. Pt aware the office will call re: appt.  Mammogram status: Ordered 014/12/2023. Pt provided with contact info and advised to call to schedule appt.   Bone Density status: Ordered 10/16/2023. Pt provided with contact info and advised to call to schedule appt.  Lung Cancer Screening: (Low Dose CT Chest recommended if Age 53-80 years, 20 pack-year  currently smoking OR have quit w/in 15years.) does not qualify.   Lung Cancer Screening Referral: n/a  Additional Screening:  Hepatitis C Screening: does qualify; Completed 05/19/2017  Vision Screening: Recommended annual ophthalmology exams for early detection of glaucoma and other disorders of the eye. Is the patient up to date with their annual eye exam?  Yes  Who is the provider or what is the name of the office in which the patient attends annual eye exams? Dr. Camillo If pt is not established with a provider, would they like to be referred to a provider to establish care? No .   Dental Screening: Recommended annual dental exams for proper oral hygiene   Community Resource Referral / Chronic Care Management: CRR required this visit?  No   CCM required this visit?  No     Plan:     I have personally reviewed and noted the following in the patient's chart:   Medical and social history Use of alcohol, tobacco or illicit drugs  Current medications and supplements including opioid prescriptions. Patient is not currently taking opioid prescriptions. Functional ability and status Nutritional status Physical activity Advanced directives List of other physicians Hospitalizations, surgeries, and ER visits in previous 12 months Vitals Screenings to include cognitive, depression, and falls Referrals and appointments  In addition, I have reviewed and discussed with patient certain preventive protocols, quality metrics, and best practice recommendations. A written personalized care plan for preventive services as well as general preventive health recommendations were provided to patient.     Aloise Copus L Zailee Vallely, CMA   10/16/2023   After Visit Summary: (In Person-Printed) AVS printed and given to the patient  Nurse Notes:  Patient is due for a Tdap, Flu and 2nd Shingrix vaccines.  She is due for a mammogram, a DEXA and a colonoscopy, all of which has been ordered today.  Patient had no  other concerns to address today.

## 2023-10-16 NOTE — Patient Instructions (Signed)
 Ms. Coletta , Thank you for taking time to come for your Medicare Wellness Visit. I appreciate your ongoing commitment to your health goals. Please review the following plan we discussed and let me know if I can assist you in the future.   Referrals/Orders/Follow-Ups/Clinician Recommendations: You are due for 2nd Shingles, Flu, Tetanus vaccine.  Remember to call to schedule your colonoscopy [facility] at [phone].  You have an order for:   [x]   3D Mammogram  [x]   Bone Density     Please call for appointment:  [facility] [address] [place], [place] [address] [phone]    Make sure to wear two-piece clothing.  No lotions, powders, or deodorants the day of the appointment. Make sure to bring picture ID and insurance card.  Bring list of medications you are currently taking including any supplements.   Schedule your [place] screening mammogram through MyChart!   Log into your MyChart account.  Go to 'Visit' (or 'Appointments' if on mobile App) --> Schedule an Appointment  Under 'Select a Reason for Visit' choose the Mammogram Screening option.  Complete the pre-visit questions and select the time and place that best fits your schedule.    This is a list of the screening recommended for you and due dates:  Health Maintenance  Topic Date Due   DTaP/Tdap/Td vaccine (2 - Tdap) 07/01/2015   DEXA scan (bone density measurement)  02/17/2021   COVID-19 Vaccine (3 - 2024-25 season) 06/14/2023   Zoster (Shingles) Vaccine (2 of 2) 08/04/2023   Flu Shot  01/11/2024*   Colon Cancer Screening  03/23/2024   Medicare Annual Wellness Visit  10/15/2024   Pneumonia Vaccine  Completed   Hepatitis C Screening  Completed   HPV Vaccine  Aged Out  *Topic was postponed. The date shown is not the original due date.    Advanced directives: (Provided) Advance directive discussed with you today. I have provided a copy for you to complete  at home and have notarized. Once this is complete, please bring a copy in to our office so we can scan it into your chart.   Next Medicare Annual Wellness Visit scheduled for next year: Yes

## 2023-10-22 ENCOUNTER — Other Ambulatory Visit: Payer: Self-pay | Admitting: Nurse Practitioner

## 2023-10-22 DIAGNOSIS — Z780 Asymptomatic menopausal state: Secondary | ICD-10-CM

## 2023-10-22 DIAGNOSIS — Z1231 Encounter for screening mammogram for malignant neoplasm of breast: Secondary | ICD-10-CM

## 2023-10-22 DIAGNOSIS — Z1211 Encounter for screening for malignant neoplasm of colon: Secondary | ICD-10-CM

## 2023-11-04 DIAGNOSIS — Z885 Allergy status to narcotic agent status: Secondary | ICD-10-CM | POA: Diagnosis not present

## 2023-11-04 DIAGNOSIS — L309 Dermatitis, unspecified: Secondary | ICD-10-CM | POA: Diagnosis not present

## 2023-11-04 DIAGNOSIS — F325 Major depressive disorder, single episode, in full remission: Secondary | ICD-10-CM | POA: Diagnosis not present

## 2023-11-04 DIAGNOSIS — Z8249 Family history of ischemic heart disease and other diseases of the circulatory system: Secondary | ICD-10-CM | POA: Diagnosis not present

## 2023-11-04 DIAGNOSIS — Z880 Allergy status to penicillin: Secondary | ICD-10-CM | POA: Diagnosis not present

## 2023-11-04 DIAGNOSIS — Z6833 Body mass index (BMI) 33.0-33.9, adult: Secondary | ICD-10-CM | POA: Diagnosis not present

## 2023-11-04 DIAGNOSIS — J309 Allergic rhinitis, unspecified: Secondary | ICD-10-CM | POA: Diagnosis not present

## 2023-11-04 DIAGNOSIS — N393 Stress incontinence (female) (male): Secondary | ICD-10-CM | POA: Diagnosis not present

## 2023-11-04 DIAGNOSIS — E669 Obesity, unspecified: Secondary | ICD-10-CM | POA: Diagnosis not present

## 2023-11-04 DIAGNOSIS — M1990 Unspecified osteoarthritis, unspecified site: Secondary | ICD-10-CM | POA: Diagnosis not present

## 2023-11-04 DIAGNOSIS — E785 Hyperlipidemia, unspecified: Secondary | ICD-10-CM | POA: Diagnosis not present

## 2023-11-04 DIAGNOSIS — F419 Anxiety disorder, unspecified: Secondary | ICD-10-CM | POA: Diagnosis not present

## 2023-11-11 ENCOUNTER — Telehealth: Payer: Self-pay | Admitting: Pharmacist

## 2023-11-11 ENCOUNTER — Encounter: Payer: Self-pay | Admitting: Student

## 2023-11-11 ENCOUNTER — Ambulatory Visit: Payer: Medicare HMO | Attending: Internal Medicine | Admitting: Student

## 2023-11-11 ENCOUNTER — Other Ambulatory Visit (HOSPITAL_COMMUNITY): Payer: Self-pay

## 2023-11-11 ENCOUNTER — Telehealth: Payer: Self-pay | Admitting: Pharmacy Technician

## 2023-11-11 DIAGNOSIS — E785 Hyperlipidemia, unspecified: Secondary | ICD-10-CM

## 2023-11-11 MED ORDER — ICOSAPENT ETHYL 1 G PO CAPS: 2.0000 g | ORAL_CAPSULE | Freq: Two times a day (BID) | ORAL | 11 refills | Status: AC

## 2023-11-11 NOTE — Assessment & Plan Note (Signed)
Assessment:  LDL goal: < 70 mg/dl TG <161 mg/dl last LDLc 096 mg/dl TG 045  (40/9811) while on Zetia 10 mg daily  Intolerance to statins - Crestor and Lipitor - severe muscle pain/cramps  Discussed next potential options ( Lovaza, fenofibrate, Vascepa, PCSK-9 inhibitors, bempedoic acid and inclisiran); cost, dosing efficacy, side effects  Patient is in agreement to try Vascepa and Praluent - she has latex allergy (swelling and rash)   Plan: Continue taking current medications (Zetia 10 mg daily)  Start taking Vascepa 2 gm twice daily  Will apply for PA for PCSK9i; will inform patient upon approval  Lipid lab due February 08, 2024

## 2023-11-11 NOTE — Telephone Encounter (Signed)
Per test claim: PA required; PA started via CoverMyMeds. KEY [id number] . Please see clinical question(s) below that I am not finding the answer to in her chart and advise.   I hit yes on repatha because of the allergy but its aasking about Nexletol.Marland Kitchen

## 2023-11-11 NOTE — Progress Notes (Signed)
Patient ID: [name]                 DOB: 28-Aug-1946                    MRN: [id number]      HPI: Pam Butler is a 78 y.o. female patient referred to lipid clinic by Dr. Lynnette Butler . PMH is significant for CAD, IBS,GERD.   Patient reported intolerance to Lipitor and Crestor. Patient only taking Zetia.  Patient presented today for lipid clinic. Reports she is tolerating Zetia well without any issue. Her diet is good but sweets are her weakness. She was doing regular exercise but lately she has not been doing any. Her husband has early onset of dementia so she is worried about him. She is also concern that these new lipid lowering medications will affect her cognitive functions.  Reviewed options for lowering  TG and  LDL cholesterol, including, Vascepa, Lovaza, fenofibrate, PCSK-9 inhibitors, bempedoic acid and inclisiran.  Discussed mechanisms of action, dosing, side effects and potential decreases in LDL cholesterol.  Also reviewed cost information and potential options for patient assistance.  We reviewed s/e of each non statin cholesterol medications in detail and patient is in agreement to start Vascepa and Praluent.  Current Medications:  Zetia 10 mg daily  Intolerances: Crestor and Lipitor- severe muscle cramps  Risk Factors: CAD, CAC score 113, family hx of premature ASCVD (paternal grand father - MI at age 20, maternal uncle MI at age 7)  LDL goal: <70 mg/dl and TG <409 mg/dl   Diet:  needs improvement. Well cut down on sweets.   Exercise: not enough- use to do pilate. Now can't get off the floor easily will start doing chair exercises.     Family History:  Relation Problem Comments  Mother (Deceased) Colitis   Other Prediabetes    Father (Deceased) Pneumonia     Sister (Alive) Arthritis     Maternal Uncle Heart attack (Age: 31)     Paternal Grandmother Heart attack (Age: 28)     Social History:  Alcohol: none  Smoking: none   Labs: Lipid Panel     Component Value  Date/Time   CHOL 244 (H) 09/23/2023 1522   TRIG 289 (H) 09/23/2023 1522   HDL 42 09/23/2023 1522   CHOLHDL 5.8 (H) 09/23/2023 1522   CHOLHDL 6 06/16/2023 1439   VLDL 63.4 (H) 12/16/2021 1440   LDLCALC 149 (H) 09/23/2023 1522   LDLCALC  05/16/2020 1341     Comment:     . LDL cholesterol not calculated. Triglyceride levels greater than 400 mg/dL invalidate calculated LDL results. . Reference range: <100 . Desirable range <100 mg/dL for primary prevention;   <70 mg/dL for patients with CHD or diabetic patients  with > or = 2 CHD risk factors. Marland Kitchen LDL-C is now calculated using the Martin-Hopkins  calculation, which is a validated novel method providing  better accuracy than the Friedewald equation in the  estimation of LDL-C.  Horald Pollen et al. Lenox Ahr. 8119;147(82): 2061-2068  (http://education.[employer].com/faq/FAQ164)    LDLDIRECT 156.0 06/16/2023 1439   LABVLDL 53 (H) 09/23/2023 1522    Past Medical History:  Diagnosis Date   Allergy    Anxiety    Cataract    Bil   Depression    History of bloody stools    in past/   History of muscle pain    Bil pain in bil arms and shoulders/ chronic since last  year/left shoulder started this year   Loose stools    on and off/occasional blood in stool    Current Outpatient Medications on File Prior to Visit  Medication Sig Dispense Refill   Acetaminophen (TYLENOL EXTRA STRENGTH PO) Take 1 tablet by mouth every evening. Take one prn     aspirin EC 81 MG tablet Take 1 tablet (81 mg total) by mouth daily. Swallow whole. 90 tablet 3   atorvastatin (LIPITOR) 40 MG tablet Take 1 tablet (40 mg total) by mouth daily. 90 tablet 3   calcium carbonate (CALCIUM 600) 600 MG TABS tablet Take 1 tablet (600 mg total) by mouth daily with breakfast. 30 tablet    cetirizine (ZYRTEC) 10 MG tablet TAKE 1 TABLET BY MOUTH EVERY DAY 90 tablet 0   Cholecalciferol (VITAMIN D) 50 MCG (2000 UT) CAPS 1 cap daily 30 capsule 0   citalopram (CELEXA) 20 MG  tablet Take 1 tablet (20 mg total) by mouth daily. 90 tablet 3   Clobetasol Prop Emollient Base (CLOBETASOL PROPIONATE E) 0.05 % emollient cream Apply 1 Application topically at bedtime. 30 g 2   clotrimazole-betamethasone (LOTRISONE) cream Apply bid to corners of mouth 15 g 2   COLLAGEN PO Take 1 tablet by mouth daily.     doxepin (SINEQUAN) 10 MG capsule Take 10 mg by mouth at bedtime.     ezetimibe (ZETIA) 10 MG tablet TAKE 1 TABLET BY MOUTH EVERY DAY 90 tablet 3   Melatonin 10 MG CAPS Take 1 tablet by mouth as needed (sleep).     Multiple Vitamins-Minerals (WOMENS MULTI VITAMIN & MINERAL PO) Take 1 tablet by mouth daily. Centrum Womens MVI-Take one daily     triamcinolone cream (KENALOG) 0.1 % Apply 1 Application topically 2 (two) times daily.     trospium (SANCTURA) 20 MG tablet Take 1 tablet (20 mg total) by mouth 2 (two) times daily. 60 tablet 5   TURMERIC PO Take 1 tablet by mouth daily.     No current facility-administered medications on file prior to visit.    Allergies  Allergen Reactions   Latex     Causes swelling around face   Penicillins     Hives   Ciprofloxacin     Itching and hives   Codeine     Itching(note: She has taken codeine containing cough preparations since)   Crestor [Rosuvastatin Calcium]     Myalgias    Assessment/Plan:  1. Hyperlipidemia -  Problem  Hyperlipidemia   Current Medications:  Zetia 10 mg daily  Intolerances: Crestor and Lipitor- severe muscle cramps  Risk Factors: CAD, CAC score 113  LDL goal: <70 mg/dl and TG <161 mg/dl      Hyperlipidemia Assessment:  LDL goal: < 70 mg/dl TG <096 mg/dl last LDLc 045 mg/dl TG 409  (81/1914) while on Zetia 10 mg daily  Intolerance to statins - Crestor and Lipitor - severe muscle pain/cramps  Discussed next potential options ( Lovaza, fenofibrate, Vascepa, PCSK-9 inhibitors, bempedoic acid and inclisiran); cost, dosing efficacy, side effects  Patient is in agreement to try Vascepa and Praluent -  she has latex allergy (swelling and rash)   Plan: Continue taking current medications (Zetia 10 mg daily)  Start taking Vascepa 2 gm twice daily  Will apply for PA for PCSK9i; will inform patient upon approval  Lipid lab due February 08, 2024     Thank you,  Carmela Hurt, [place].D [facility] [phone]  [address] [place], [place] [address]  Phone: [phone]; Fax: [phone]

## 2023-11-11 NOTE — Patient Instructions (Signed)
Start taking Vascepa 1 gm 2 capsules twice daily and continue taking ezetimibe 10 mg daily.  We will initiate prior authorization for Praluent 75 mg. Will inform you upon approval.

## 2023-11-11 NOTE — Telephone Encounter (Signed)
PCSK9i PA request sent to the pool

## 2023-11-13 NOTE — Telephone Encounter (Signed)
Pharmacy Patient Advocate Encounter   Received notification from Pt Calls Messages that prior authorization for Praluent 75MG /ML auto-injectors is required/requested.   Insurance verification completed.   The patient is insured through [facility] .   Per test claim: PA required; PA submitted to above mentioned insurance via CoverMyMeds Key/confirmation #/EOC [id number] Status is pending

## 2023-11-16 NOTE — Telephone Encounter (Signed)
Pharmacy Patient Advocate Encounter  Received notification from [facility] that Prior Authorization for Praluent 75MG /ML auto-injectors  has been DENIED.  I called [phone] because the denial stated they didn't receive certain information in time so it was denied. They said it was denied because they haven't tried and failed Nexletol   PA #/Case ID/Reference #:  [id number]

## 2023-11-18 NOTE — Telephone Encounter (Signed)
 Appeal submitted over the phone case# [id number]. If their clinic la team has further clinical questions will fax us  on [phone]

## 2023-11-19 NOTE — Telephone Encounter (Signed)
 I scanned in media the forms they sent requesting more information. I started to fill them out but it asks again about why she can not have nexletol so I stopped. I did call the insurance as well but then they transferred me to a pharmacist to do a coverage determination. I can finish the form or call back if you would like, just need to know what to say about the nexletol if I do.

## 2023-11-20 NOTE — Telephone Encounter (Signed)
 Received fax for follow up question on Praluent  Appeal. Responded to f/u question on Feb 7. ( Fax to [phone])

## 2023-11-24 ENCOUNTER — Other Ambulatory Visit: Payer: Medicare HMO

## 2023-11-25 ENCOUNTER — Other Ambulatory Visit (HOSPITAL_COMMUNITY): Payer: Self-pay

## 2023-11-25 ENCOUNTER — Telehealth: Payer: Self-pay | Admitting: Pharmacy Technician

## 2023-11-25 MED ORDER — PRALUENT 150 MG/ML ~~LOC~~ SOAJ: 150.0000 mg | SUBCUTANEOUS | 2 refills | Status: AC

## 2023-11-25 NOTE — Telephone Encounter (Signed)
Pharmacy Patient Advocate Encounter  Received notification from [employer] that Prior Authorization for Praluent has been APPROVED from 11/25/23 to 10/12/24. Ran test claim, Copay is $183.90. This test claim was processed through [facility]- copay amounts may vary at other pharmacies due to pharmacy/plan contracts, or as the patient moves through the different stages of their insurance plan.

## 2023-11-25 NOTE — Addendum Note (Signed)
Addended by: Tylene Fantasia on: 11/25/2023 01:02 PM   Modules accepted: Orders

## 2023-11-25 NOTE — Telephone Encounter (Signed)
Patient made aware of PA approval. Prescription sent. Cost assistance needed. Routed to team to get enrolled in the grant.

## 2023-11-25 NOTE — Telephone Encounter (Signed)
Hello, grant approved. The pharmacy is aware. I tried to call the patient and had to leave a message. Thank you

## 2023-12-01 ENCOUNTER — Ambulatory Visit
Admission: RE | Admit: 2023-12-01 | Discharge: 2023-12-01 | Disposition: A | Discharge status: Home / Self Care | Payer: Medicare HMO | Source: Ambulatory Visit | Attending: Nurse Practitioner | Admitting: Nurse Practitioner

## 2023-12-01 DIAGNOSIS — Z1231 Encounter for screening mammogram for malignant neoplasm of breast: Secondary | ICD-10-CM | POA: Diagnosis not present

## 2023-12-13 ENCOUNTER — Encounter: Payer: Self-pay | Admitting: Internal Medicine

## 2023-12-13 NOTE — Progress Notes (Unsigned)
 Subjective:    Patient ID: Pam Butler, female    DOB: [birth year], 78 y.o.   MRN: [id number]     HPI Pam Butler is here for follow up of her chronic medical problems.  Some increased anxiety , depression - related to her husbands health-just recently diagnosed with Parkinson's disease.    Walking  a little outside - not as active as she should be.    Medications and allergies reviewed with patient and updated if appropriate.  Current Outpatient Medications on File Prior to Visit  Medication Sig Dispense Refill   Acetaminophen (TYLENOL EXTRA STRENGTH PO) Take 1 tablet by mouth every evening. Take one prn     Alirocumab (PRALUENT) 150 MG/ML SOAJ Inject 1 mL (150 mg total) into the skin every 14 (fourteen) days. 2 mL 2   aspirin EC 81 MG tablet Take 1 tablet (81 mg total) by mouth daily. Swallow whole. 90 tablet 3   calcium carbonate (CALCIUM 600) 600 MG TABS tablet Take 1 tablet (600 mg total) by mouth daily with breakfast. 30 tablet    cetirizine (ZYRTEC) 10 MG tablet TAKE 1 TABLET BY MOUTH EVERY DAY 90 tablet 0   Cholecalciferol (VITAMIN D) 50 MCG (2000 UT) CAPS 1 cap daily 30 capsule 0   citalopram (CELEXA) 20 MG tablet Take 1 tablet (20 mg total) by mouth daily. 90 tablet 3   Clobetasol Prop Emollient Base (CLOBETASOL PROPIONATE E) 0.05 % emollient cream Apply 1 Application topically at bedtime. 30 g 2   clotrimazole-betamethasone (LOTRISONE) cream Apply bid to corners of mouth 15 g 2   COLLAGEN PO Take 1 tablet by mouth daily.     doxepin (SINEQUAN) 10 MG capsule Take 10 mg by mouth at bedtime.     ezetimibe (ZETIA) 10 MG tablet TAKE 1 TABLET BY MOUTH EVERY DAY 90 tablet 3   icosapent Ethyl (VASCEPA) 1 g capsule Take 2 capsules (2 g total) by mouth 2 (two) times daily. 120 capsule 11   Melatonin 10 MG CAPS Take 1 tablet by mouth as needed (sleep).     Multiple Vitamins-Minerals (WOMENS MULTI VITAMIN & MINERAL PO) Take 1 tablet by mouth daily. Centrum Womens MVI-Take one daily      triamcinolone cream (KENALOG) 0.1 % Apply 1 Application topically 2 (two) times daily.     trospium (SANCTURA) 20 MG tablet Take 1 tablet (20 mg total) by mouth 2 (two) times daily. 60 tablet 5   TURMERIC PO Take 1 tablet by mouth daily.     No current facility-administered medications on file prior to visit.     Review of Systems  Constitutional:  Negative for fever.  Respiratory:  Positive for shortness of breath (chronic - no change). Negative for cough and wheezing.   Cardiovascular:  Positive for leg swelling (LLE > RLE). Negative for chest pain and palpitations.  Neurological:  Negative for light-headedness and headaches.       Objective:   Vitals:   12/14/23 1333  BP: 102/60  Pulse: 76  Temp: 98.4 F (36.9 C)  SpO2: 95%   BP Readings from Last 3 Encounters:  12/14/23 102/60  10/16/23 (!) 140/82  09/23/23 136/72   Wt Readings from Last 3 Encounters:  12/14/23 179 lb 4 oz (81.3 kg)  10/16/23 175 lb 6.4 oz (79.6 kg)  09/23/23 170 lb (77.1 kg)   Body mass index is 33.05 kg/m.    Physical Exam Constitutional:      General: She is  not in acute distress.    Appearance: Normal appearance.  HENT:     Head: Normocephalic and atraumatic.  Eyes:     Conjunctiva/sclera: Conjunctivae normal.  Cardiovascular:     Rate and Rhythm: Normal rate and regular rhythm.     Heart sounds: Normal heart sounds.  Pulmonary:     Effort: Pulmonary effort is normal. No respiratory distress.     Breath sounds: Normal breath sounds. No wheezing.  Musculoskeletal:     Cervical back: Neck supple.     Right lower leg: No edema.     Left lower leg: No edema.  Lymphadenopathy:     Cervical: No cervical adenopathy.  Skin:    General: Skin is warm and dry.     Findings: No rash.  Neurological:     Mental Status: She is alert. Mental status is at baseline.  Psychiatric:        Mood and Affect: Mood normal.        Behavior: Behavior normal.        Lab Results  Component Value  Date   WBC 9.3 06/16/2023   HGB 12.3 06/16/2023   HCT 36.6 06/16/2023   PLT 317.0 06/16/2023   GLUCOSE 82 06/16/2023   CHOL 244 (H) 09/23/2023   TRIG 289 (H) 09/23/2023   HDL 42 09/23/2023   LDLDIRECT 156.0 06/16/2023   LDLCALC 149 (H) 09/23/2023   ALT 15 06/16/2023   AST 20 06/16/2023   NA 138 06/16/2023   K 4.2 06/16/2023   CL 105 06/16/2023   CREATININE 1.14 06/16/2023   BUN 23 06/16/2023   CO2 22 06/16/2023   TSH 2.27 06/16/2023   HGBA1C 5.6 06/16/2023     Assessment & Plan:    See Problem List for Assessment and Plan of chronic medical problems.

## 2023-12-13 NOTE — Patient Instructions (Addendum)
   Call and schedule your bone density test at [facility]  - [phone]    Blood work was ordered.       Medications changes include :   None    A referral was ordered and someone will call you to schedule an appointment.     Return in about 6 months (around 06/15/2024) for Physical Exam.

## 2023-12-14 ENCOUNTER — Ambulatory Visit (INDEPENDENT_AMBULATORY_CARE_PROVIDER_SITE_OTHER): Payer: Medicare HMO | Admitting: Internal Medicine

## 2023-12-14 VITALS — BP 102/60 | HR 76 | Temp 98.4°F | Ht 61.75 in | Wt 179.2 lb

## 2023-12-14 DIAGNOSIS — N1831 Chronic kidney disease, stage 3a: Secondary | ICD-10-CM

## 2023-12-14 DIAGNOSIS — R7303 Prediabetes: Secondary | ICD-10-CM | POA: Diagnosis not present

## 2023-12-14 DIAGNOSIS — M85851 Other specified disorders of bone density and structure, right thigh: Secondary | ICD-10-CM

## 2023-12-14 DIAGNOSIS — F3289 Other specified depressive episodes: Secondary | ICD-10-CM | POA: Diagnosis not present

## 2023-12-14 DIAGNOSIS — F419 Anxiety disorder, unspecified: Secondary | ICD-10-CM

## 2023-12-14 DIAGNOSIS — I2510 Atherosclerotic heart disease of native coronary artery without angina pectoris: Secondary | ICD-10-CM

## 2023-12-14 DIAGNOSIS — E7849 Other hyperlipidemia: Secondary | ICD-10-CM | POA: Diagnosis not present

## 2023-12-14 NOTE — Assessment & Plan Note (Signed)
Chronic Mild, stable CMP, CBC Advised increase water intake Avoid NSAIDs Low-sodium diet

## 2023-12-14 NOTE — Assessment & Plan Note (Addendum)
 Chronic Regular exercise and healthy diet encouraged Continue Zetia 10 mg daily, vascepa 2 g bid, will start Praluent 150 mg q 14 days

## 2023-12-14 NOTE — Assessment & Plan Note (Signed)
 Chronic dexa due - ordered already-instructed her to call and schedule Continue calcium and vitamin d daily Encouraged regular exercise

## 2023-12-14 NOTE — Assessment & Plan Note (Signed)
Chronic Check a1c Low sugar / carb diet Stressed regular exercise  

## 2023-12-14 NOTE — Assessment & Plan Note (Signed)
Chronic Controlled, Stable Continue citalopram 20 mg daily 

## 2023-12-14 NOTE — Assessment & Plan Note (Addendum)
 Chronic Mild Check cmp, cbc Following with cardiology On Zetia 10 mg daily, vascepa 2gm bid,  not taking Praluent 150 mg Q 14 days Continue aspirin 81 mg daily

## 2023-12-14 NOTE — Assessment & Plan Note (Signed)
Chronic Fairly controlled, but she admits she still worries a lot Continue regular exercise Continue citalopram 20 mg daily

## 2023-12-27 ENCOUNTER — Other Ambulatory Visit: Payer: Self-pay | Admitting: Internal Medicine

## 2024-01-13 ENCOUNTER — Ambulatory Visit (INDEPENDENT_AMBULATORY_CARE_PROVIDER_SITE_OTHER)
Admission: RE | Admit: 2024-01-13 | Discharge: 2024-01-13 | Discharge status: Home / Self Care | Source: Ambulatory Visit | Attending: Nurse Practitioner | Admitting: Nurse Practitioner

## 2024-01-13 DIAGNOSIS — Z780 Asymptomatic menopausal state: Secondary | ICD-10-CM | POA: Diagnosis not present

## 2024-01-13 DIAGNOSIS — Z0000 Encounter for general adult medical examination without abnormal findings: Secondary | ICD-10-CM

## 2024-02-22 ENCOUNTER — Telehealth: Payer: Self-pay | Admitting: Pharmacist

## 2024-02-22 NOTE — Telephone Encounter (Signed)
 Praluent  was started in Feb.Due for f/u lab. Call to remind. N/A LVM to call back.

## 2024-02-24 ENCOUNTER — Ambulatory Visit: Payer: Self-pay

## 2024-03-02 ENCOUNTER — Emergency Department (HOSPITAL_COMMUNITY)

## 2024-03-02 ENCOUNTER — Encounter (HOSPITAL_COMMUNITY): Payer: Self-pay

## 2024-03-02 ENCOUNTER — Emergency Department (HOSPITAL_COMMUNITY)
Admission: EM | Admit: 2024-03-02 | Discharge: 2024-03-02 | Disposition: A | Discharge status: Home / Self Care | Attending: Emergency Medicine | Admitting: Emergency Medicine

## 2024-03-02 DIAGNOSIS — S0011XA Contusion of right eyelid and periocular area, initial encounter: Secondary | ICD-10-CM | POA: Diagnosis not present

## 2024-03-02 DIAGNOSIS — W010XXA Fall on same level from slipping, tripping and stumbling without subsequent striking against object, initial encounter: Secondary | ICD-10-CM | POA: Insufficient documentation

## 2024-03-02 DIAGNOSIS — Z7982 Long term (current) use of aspirin: Secondary | ICD-10-CM | POA: Insufficient documentation

## 2024-03-02 DIAGNOSIS — M5030 Other cervical disc degeneration, unspecified cervical region: Secondary | ICD-10-CM | POA: Diagnosis not present

## 2024-03-02 DIAGNOSIS — S0083XA Contusion of other part of head, initial encounter: Secondary | ICD-10-CM | POA: Diagnosis not present

## 2024-03-02 DIAGNOSIS — S0990XA Unspecified injury of head, initial encounter: Secondary | ICD-10-CM | POA: Diagnosis not present

## 2024-03-02 DIAGNOSIS — Z91040 Latex allergy status: Secondary | ICD-10-CM | POA: Diagnosis not present

## 2024-03-02 DIAGNOSIS — W19XXXA Unspecified fall, initial encounter: Secondary | ICD-10-CM

## 2024-03-02 NOTE — ED Provider Triage Note (Signed)
 Emergency Medicine Provider Triage Evaluation Note  Pam Butler , a 78 y.o. female  was evaluated in triage.  Pt complains of mechanical fall approximately 12 hours ago.  Reports falling on the same and on the right side of her face, complaining of pain to the area, along with worsening swelling.  Taken some over-the-counter medication without any improvement in symptoms.  No loss of consciousness, no headache, no vision changes, no weakness.  Review of Systems  Positive: wound Negative: Headache, nausea, vomiting  Physical Exam  BP (!) 157/69 (BP Location: Right Arm)   Pulse 71   Temp 98.5 F (36.9 C)   Resp 16   SpO2 100%  Gen:   Awake, no distress   Resp:  Normal effort  MSK:   Moves extremities without difficulty  Other:  Arch ecchymosis noted to the right side of the eye along with some facial swelling. Neuro exam is benign.  Medical Decision Making  Medically screening exam initiated at 2:45 PM.  Appropriate orders placed.  Pam Butler was informed that the remainder of the evaluation will be completed by another provider, this initial triage assessment does not replace that evaluation, and the importance of remaining in the ED until their evaluation is complete.     Sargun Rummell, PA-C 03/02/24 1446

## 2024-03-02 NOTE — ED Provider Notes (Signed)
 [place] EMERGENCY DEPARTMENT AT [facility] Provider Note   CSN: [id number] Arrival date & time: 03/02/24  1433     History  Chief Complaint  Patient presents with   Fall   Head Injury    Pam Butler is a 78 y.o. female.   Fall  Head Injury Patient presents with fall.  Happened last night.  Reportedly stepped on some cement near some gravel and fell striking her head.  No loss conscious.  Has taken some Tylenol.  Some swelling to the right face.  No vision changes.  No loss consciousness.  Not on blood thinners.    Past Medical History:  Diagnosis Date   Allergy    Anxiety    Cataract    Bil   Depression    History of bloody stools    in past/   History of muscle pain    Bil pain in bil arms and shoulders/ chronic since last year/left shoulder started this year   Loose stools    on and off/occasional blood in stool    Home Medications Prior to Admission medications   Medication Sig Start Date End Date Taking? Authorizing Provider  Acetaminophen (TYLENOL EXTRA STRENGTH PO) Take 1 tablet by mouth every evening. Take one prn    [provider]  Alirocumab  (PRALUENT ) 150 MG/ML SOAJ Inject 1 mL (150 mg total) into the skin every 14 (fourteen) days. 11/25/23   Thukkani, Arun K, MD  aspirin  EC 81 MG tablet Take 1 tablet (81 mg total) by mouth daily. Swallow whole. 08/07/22   Thukkani, Arun K, MD  calcium  carbonate (CALCIUM  600) 600 MG TABS tablet Take 1 tablet (600 mg total) by mouth daily with breakfast. 05/15/20   Colene Dauphin, MD  cetirizine  (ZYRTEC ) 10 MG tablet TAKE 1 TABLET BY MOUTH EVERY DAY 12/28/23   Colene Dauphin, MD  Cholecalciferol (VITAMIN D ) 50 MCG (2000 UT) CAPS 1 cap daily 05/15/20   Colene Dauphin, MD  citalopram  (CELEXA ) 20 MG tablet Take 1 tablet (20 mg total) by mouth daily. 06/16/23   Colene Dauphin, MD  Clobetasol  Prop Emollient Base (CLOBETASOL  PROPIONATE E) 0.05 % emollient cream Apply 1 Application topically at bedtime. 06/18/23    Zuleta, Kaitlin G, NP  clotrimazole -betamethasone  (LOTRISONE ) cream Apply bid to corners of mouth 06/16/23   Colene Dauphin, MD  COLLAGEN PO Take 1 tablet by mouth daily.    [provider]  doxepin (SINEQUAN) 10 MG capsule Take 10 mg by mouth at bedtime. 05/16/23   [provider]  ezetimibe  (ZETIA ) 10 MG tablet TAKE 1 TABLET BY MOUTH EVERY DAY 06/08/23   Colene Dauphin, MD  icosapent  Ethyl (VASCEPA ) 1 g capsule Take 2 capsules (2 g total) by mouth 2 (two) times daily. 11/11/23   Thukkani, Arun K, MD  Melatonin 10 MG CAPS Take 1 tablet by mouth as needed (sleep).    [provider]  Multiple Vitamins-Minerals (WOMENS MULTI VITAMIN & MINERAL PO) Take 1 tablet by mouth daily. Centrum Womens MVI-Take one daily    [provider]  triamcinolone  cream (KENALOG) 0.1 % Apply 1 Application topically 2 (two) times daily. 06/12/23   [provider]  TURMERIC PO Take 1 tablet by mouth daily.    [provider]      Allergies    Latex, Penicillins, Ciprofloxacin, Codeine, and Crestor  [rosuvastatin  calcium ]    Review of Systems   Review of Systems  Physical Exam Updated Vital  Signs BP (!) 157/69 (BP Location: Right Arm)   Pulse 71   Temp 98.5 F (36.9 C)   Resp 16   Ht 5\' 1"  (1.549 m)   Wt 81.2 kg   SpO2 100%   BMI 33.82 kg/m  Physical Exam Vitals and nursing note reviewed.  HENT:     Head:     Comments: Eye movement intact.  Right sided periorbital hematoma.  No cervical spine tenderness.  No bony tenderness to face. Musculoskeletal:     Cervical back: Neck supple. No tenderness.     Comments: No shoulder tenderness.  Neurological:     Mental Status: She is oriented to person, place, and time.     ED Results / Procedures / Treatments   Labs (all labs ordered are listed, but only abnormal results are displayed) Labs Reviewed - No data to display  EKG None  Radiology CT HEAD WO CONTRAST ( ) Result Date: 03/02/2024 CLINICAL DATA:   Fall, right-sided facial injury EXAM: CT HEAD WITHOUT CONTRAST CT MAXILLOFACIAL WITHOUT CONTRAST CT CERVICAL SPINE WITHOUT CONTRAST TECHNIQUE: Multidetector CT imaging of the head, cervical spine, and maxillofacial structures were performed using the standard protocol without intravenous contrast. Multiplanar CT image reconstructions of the cervical spine and maxillofacial structures were also generated. RADIATION DOSE REDUCTION: This exam was performed according to the departmental dose-optimization program which includes automated exposure control, adjustment of the mA and/or kV according to patient size and/or use of iterative reconstruction technique. COMPARISON:  None Available. FINDINGS: CT HEAD FINDINGS Brain: No evidence of acute infarction, hemorrhage, hydrocephalus, extra-axial collection or mass lesion/mass effect. Periventricular white matter hypodensity. Vascular: No hyperdense vessel or unexpected calcification. CT FACIAL BONES FINDINGS Skull: Normal. Negative for fracture or focal lesion. Facial bones: No displaced fractures or dislocations. Sinuses/Orbits: No acute finding. Other: Soft tissue contusion overlying the right orbit (series 3, image 71). CT CERVICAL SPINE FINDINGS Alignment: Normal. Skull base and vertebrae: No acute fracture. No primary bone lesion or focal pathologic process. Soft tissues and spinal canal: No prevertebral fluid or swelling. No visible canal hematoma. 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 Upper chest: Negative. Other: None. IMPRESSION: 1. No acute intracranial pathology. 2. No displaced fractures or dislocations of the facial bones. 3. Soft tissue contusion overlying the right orbit. 4. No fracture or static subluxation of the cervical spine. 5. 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 Electronically Signed   By: Fredricka Jenny M.D.   On: 03/02/2024 15:54   CT Cervical Spine Wo  Contrast Result Date: 03/02/2024 CLINICAL DATA:  Fall, right-sided facial injury EXAM: CT HEAD WITHOUT CONTRAST CT MAXILLOFACIAL WITHOUT CONTRAST CT CERVICAL SPINE WITHOUT CONTRAST TECHNIQUE: Multidetector CT imaging of the head, cervical spine, and maxillofacial structures were performed using the standard protocol without intravenous contrast. Multiplanar CT image reconstructions of the cervical spine and maxillofacial structures were also generated. RADIATION DOSE REDUCTION: This exam was performed according to the departmental dose-optimization program which includes automated exposure control, adjustment of the mA and/or kV according to patient size and/or use of iterative reconstruction technique. COMPARISON:  None Available. FINDINGS: CT HEAD FINDINGS Brain: No evidence of acute infarction, hemorrhage, hydrocephalus, extra-axial collection or mass lesion/mass effect. Periventricular white matter hypodensity. Vascular: No hyperdense vessel or unexpected calcification. CT FACIAL BONES FINDINGS Skull: Normal. Negative for fracture or focal lesion. Facial bones: No displaced fractures or dislocations. Sinuses/Orbits: No acute finding. Other: Soft tissue contusion overlying the right orbit (series 3, image  71). CT CERVICAL SPINE FINDINGS Alignment: Normal. Skull base and vertebrae: No acute fracture. No primary bone lesion or focal pathologic process. Soft tissues and spinal canal: No prevertebral fluid or swelling. No visible canal hematoma. 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 Upper chest: Negative. Other: None. IMPRESSION: 1. No acute intracranial pathology. 2. No displaced fractures or dislocations of the facial bones. 3. Soft tissue contusion overlying the right orbit. 4. No fracture or static subluxation of the cervical spine. 5. 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 Electronically Signed   By: Fredricka Jenny M.D.   On:  03/02/2024 15:54   CT Maxillofacial Wo Contrast Result Date: 03/02/2024 CLINICAL DATA:  Fall, right-sided facial injury EXAM: CT HEAD WITHOUT CONTRAST CT MAXILLOFACIAL WITHOUT CONTRAST CT CERVICAL SPINE WITHOUT CONTRAST TECHNIQUE: Multidetector CT imaging of the head, cervical spine, and maxillofacial structures were performed using the standard protocol without intravenous contrast. Multiplanar CT image reconstructions of the cervical spine and maxillofacial structures were also generated. RADIATION DOSE REDUCTION: This exam was performed according to the departmental dose-optimization program which includes automated exposure control, adjustment of the mA and/or kV according to patient size and/or use of iterative reconstruction technique. COMPARISON:  None Available. FINDINGS: CT HEAD FINDINGS Brain: No evidence of acute infarction, hemorrhage, hydrocephalus, extra-axial collection or mass lesion/mass effect. Periventricular white matter hypodensity. Vascular: No hyperdense vessel or unexpected calcification. CT FACIAL BONES FINDINGS Skull: Normal. Negative for fracture or focal lesion. Facial bones: No displaced fractures or dislocations. Sinuses/Orbits: No acute finding. Other: Soft tissue contusion overlying the right orbit (series 3, image 71). CT CERVICAL SPINE FINDINGS Alignment: Normal. Skull base and vertebrae: No acute fracture. No primary bone lesion or focal pathologic process. Soft tissues and spinal canal: No prevertebral fluid or swelling. No visible canal hematoma. 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 Upper chest: Negative. Other: None. IMPRESSION: 1. No acute intracranial pathology. 2. No displaced fractures or dislocations of the facial bones. 3. Soft tissue contusion overlying the right orbit. 4. No fracture or static subluxation of the cervical spine. 5. 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 Electronically  Signed   By: Fredricka Jenny M.D.   On: 03/02/2024 15:54    Procedures Procedures    Medications Ordered in ED Medications - No data to display  ED Course/ Medical Decision Making/ A&P                                 Medical Decision Making  Patient with fall.  Hit face.  Periorbital hematoma.  Differential diagnosis includes cause such as orbital fractures, facial fractures, intracranial hemorrhage, cervical spine fracture.  Is higher risk due to age.  Head CT cervical spine CT and maxillofacial CT reassuring.  No intracranial hemorrhage or fracture seen.  Appears stable for discharge home.        Final Clinical Impression(s) / ED Diagnoses Final diagnoses:  Fall, initial encounter  Contusion of face, initial encounter  Injury of head, initial encounter    Rx / DC Orders ED Discharge Orders     None         Mozell Arias, MD 03/02/24 [phone]

## 2024-03-02 NOTE — ED Triage Notes (Signed)
 Pt c/o slip/trip and fall last night.  Pain score 2/10.  Laceration and swelling noted to R eye.  Pt denies vision changes.

## 2024-06-17 ENCOUNTER — Other Ambulatory Visit: Payer: Self-pay | Admitting: Obstetrics and Gynecology

## 2024-06-17 ENCOUNTER — Other Ambulatory Visit: Payer: Self-pay | Admitting: Internal Medicine

## 2024-06-17 DIAGNOSIS — N3281 Overactive bladder: Secondary | ICD-10-CM

## 2024-06-23 ENCOUNTER — Encounter: Admitting: Internal Medicine

## 2024-07-31 ENCOUNTER — Encounter: Payer: Self-pay | Admitting: Internal Medicine

## 2024-07-31 NOTE — Patient Instructions (Addendum)
 Flu immunization administered today.     Blood work was ordered.       Medications changes include :   None    Return in about 6 months (around 01/30/2025) for follow up.    Health Maintenance, Female Adopting a healthy lifestyle and getting preventive care are important in promoting health and wellness. Ask your health care provider about: The right schedule for you to have regular tests and exams. Things you can do on your own to prevent diseases and keep yourself healthy. What should I know about diet, weight, and exercise? Eat a healthy diet  Eat a diet that includes plenty of vegetables, fruits, low-fat dairy products, and lean protein. Do not eat a lot of foods that are high in solid fats, added sugars, or sodium. Maintain a healthy weight Body mass index (BMI) is used to identify weight problems. It estimates body fat based on height and weight. Your health care provider can help determine your BMI and help you achieve or maintain a healthy weight. Get regular exercise Get regular exercise. This is one of the most important things you can do for your health. Most adults should: Exercise for at least 150 minutes each week. The exercise should increase your heart rate and make you sweat (moderate-intensity exercise). Do strengthening exercises at least twice a week. This is in addition to the moderate-intensity exercise. Spend less time sitting. Even light physical activity can be beneficial. Watch cholesterol and blood lipids Have your blood tested for lipids and cholesterol at 79 years of age, then have this test every 5 years. Have your cholesterol levels checked more often if: Your lipid or cholesterol levels are high. You are older than 79 years of age. You are at high risk for heart disease. What should I know about cancer screening? Depending on your health history and family history, you may need to have cancer screening at various ages. This may include  screening for: Breast cancer. Cervical cancer. Colorectal cancer. Skin cancer. Lung cancer. What should I know about heart disease, diabetes, and high blood pressure? Blood pressure and heart disease High blood pressure causes heart disease and increases the risk of stroke. This is more likely to develop in people who have high blood pressure readings or are overweight. Have your blood pressure checked: Every 3-5 years if you are 52-10 years of age. Every year if you are 70 years old or older. Diabetes Have regular diabetes screenings. This checks your fasting blood sugar level. Have the screening done: Once every three years after age 52 if you are at a normal weight and have a low risk for diabetes. More often and at a younger age if you are overweight or have a high risk for diabetes. What should I know about preventing infection? Hepatitis B If you have a higher risk for hepatitis B, you should be screened for this virus. Talk with your health care provider to find out if you are at risk for hepatitis B infection. Hepatitis C Testing is recommended for: Everyone born from [birth year] through 1966. Anyone with known risk factors for hepatitis C. Sexually transmitted infections (STIs) Get screened for STIs, including gonorrhea and chlamydia, if: You are sexually active and are younger than 79 years of age. You are older than 79 years of age and your health care provider tells you that you are at risk for this type of infection. Your sexual activity has changed since you were last screened, and you are at  increased risk for chlamydia or gonorrhea. Ask your health care provider if you are at risk. Ask your health care provider about whether you are at high risk for HIV. Your health care provider may recommend a prescription medicine to help prevent HIV infection. If you choose to take medicine to prevent HIV, you should first get tested for HIV. You should then be tested every 3 months for as  long as you are taking the medicine. Pregnancy If you are about to stop having your period (premenopausal) and you may become pregnant, seek counseling before you get pregnant. Take 400 to 800 micrograms (mcg) of folic acid every day if you become pregnant. Ask for birth control (contraception) if you want to prevent pregnancy. Osteoporosis and menopause Osteoporosis is a disease in which the bones lose minerals and strength with aging. This can result in bone fractures. If you are 27 years old or older, or if you are at risk for osteoporosis and fractures, ask your health care provider if you should: Be screened for bone loss. Take a calcium or vitamin D  supplement to lower your risk of fractures. Be given hormone replacement therapy (HRT) to treat symptoms of menopause. Follow these instructions at home: Alcohol use Do not drink alcohol if: Your health care provider tells you not to drink. You are pregnant, may be pregnant, or are planning to become pregnant. If you drink alcohol: Limit how much you have to: 0-1 drink a day. Know how much alcohol is in your drink. In the [place] one drink equals one 12 oz bottle of beer (355 mL), one 5 oz glass of wine (148 mL), or one 1 oz glass of hard liquor (44 mL). Lifestyle Do not use any products that contain nicotine or tobacco. These products include cigarettes, chewing tobacco, and vaping devices, such as e-cigarettes. If you need help quitting, ask your health care provider. Do not use street drugs. Do not share needles. Ask your health care provider for help if you need support or information about quitting drugs. General instructions Schedule regular health, dental, and eye exams. Stay current with your vaccines. Tell your health care provider if: You often feel depressed. You have ever been abused or do not feel safe at home. Summary Adopting a healthy lifestyle and getting preventive care are important in promoting health and  wellness. Follow your health care provider's instructions about healthy diet, exercising, and getting tested or screened for diseases. Follow your health care provider's instructions on monitoring your cholesterol and blood pressure. This information is not intended to replace advice given to you by your health care provider. Make sure you discuss any questions you have with your health care provider. Document Revised: 02/18/2021 Document Reviewed: 02/18/2021 Elsevier Patient Education  2024 [employer].

## 2024-07-31 NOTE — Progress Notes (Unsigned)
 Subjective:    Patient ID: Pam Butler, female    DOB: 16-Mar-1946, 78 y.o.   MRN: [id number]      HPI Pam Butler is here for a Physical exam and her chronic medical problems.   [facility] advised meeting with him to discuss colonoscopy or not after last one.  She is not sure if she should have 1 or not.   She is anxious, worried about jerry.  She has to check on him - worried about this memory.   Ears, back, posterior ears itch - taking benadryl and zyrtec .  She has been taking Benadryl in the morning and Zyrtec  at night.  She often has difficulty falling asleep and then sometimes will wake up for a while and then go back to sleep and sleep until late morning.  She feels tired all the time.  Medications and allergies reviewed with patient and updated if appropriate.  Current Outpatient Medications on File Prior to Visit  Medication Sig Dispense Refill   Acetaminophen (TYLENOL EXTRA STRENGTH PO) Take 1 tablet by mouth every evening. Take one prn     Alirocumab  (PRALUENT ) 150 MG/ML SOAJ Inject 1 mL (150 mg total) into the skin every 14 (fourteen) days. 2 mL 2   aspirin  EC 81 MG tablet Take 1 tablet (81 mg total) by mouth daily. Swallow whole. 90 tablet 3   calcium  carbonate (CALCIUM  600) 600 MG TABS tablet Take 1 tablet (600 mg total) by mouth daily with breakfast. 30 tablet    cetirizine  (ZYRTEC ) 10 MG tablet TAKE 1 TABLET BY MOUTH EVERY DAY 90 tablet 0   Cholecalciferol (VITAMIN D ) 50 MCG (2000 UT) CAPS 1 cap daily 30 capsule 0   citalopram  (CELEXA ) 20 MG tablet Take 1 tablet (20 mg total) by mouth daily. 90 tablet 3   Clobetasol  Prop Emollient Base (CLOBETASOL  PROPIONATE E) 0.05 % emollient cream Apply 1 Application topically at bedtime. 30 g 2   clotrimazole -betamethasone  (LOTRISONE ) cream Apply bid to corners of mouth 15 g 2   COLLAGEN PO Take 1 tablet by mouth daily.     doxepin (SINEQUAN) 10 MG capsule Take 10 mg by mouth at bedtime.     ezetimibe  (ZETIA ) 10 MG tablet TAKE 1 TABLET  BY MOUTH EVERY DAY 90 tablet 3   icosapent  Ethyl (VASCEPA ) 1 g capsule Take 2 capsules (2 g total) by mouth 2 (two) times daily. 120 capsule 11   Melatonin 10 MG CAPS Take 1 tablet by mouth as needed (sleep).     Multiple Vitamins-Minerals (WOMENS MULTI VITAMIN & MINERAL PO) Take 1 tablet by mouth daily. Centrum Womens MVI-Take one daily     triamcinolone  cream (KENALOG) 0.1 % Apply 1 Application topically 2 (two) times daily.     TURMERIC PO Take 1 tablet by mouth daily.     No current facility-administered medications on file prior to visit.    Review of Systems  Constitutional:  Negative for fever.  Eyes:  Negative for visual disturbance.  Respiratory:  Positive for shortness of breath (chronic- no change). Negative for cough and wheezing.   Cardiovascular:  Positive for leg swelling. Negative for chest pain and palpitations.  Gastrointestinal:  Positive for constipation and diarrhea (bowels vary back and forth). Negative for abdominal pain and blood in stool.       Occ gerd  Genitourinary:  Negative for dysuria.  Musculoskeletal:  Positive for arthralgias (L knee, R knee , R wrist, R elbow). Negative for back pain.  Skin:  Negative for rash.       Itchy, dry skin back, shoulder, back of ears  Neurological:  Positive for headaches (occ). Negative for dizziness and light-headedness.  Psychiatric/Behavioral:  Positive for dysphoric mood and sleep disturbance. The patient is nervous/anxious.        Objective:   Vitals:   08/01/24 1418  BP: 128/84  Pulse: (!) 58  Temp: 97.8 F (36.6 C)  SpO2: 97%   Filed Weights   08/01/24 1418  Weight: 176 lb (79.8 kg)   Body mass index is 33.25 kg/m.  BP Readings from Last 3 Encounters:  08/01/24 128/84  03/02/24 (!) 178/77  12/14/23 102/60    Wt Readings from Last 3 Encounters:  08/01/24 176 lb (79.8 kg)  03/02/24 179 lb (81.2 kg)  12/14/23 179 lb 4 oz (81.3 kg)       Physical Exam Constitutional: She appears well-developed  and well-nourished. No distress.  HENT:  Head: Normocephalic and atraumatic.  Right Ear: External ear normal. Normal ear canal and TM Left Ear: External ear normal.  Normal ear canal and TM Mouth/Throat: Oropharynx is clear and moist.  Eyes: Conjunctivae normal.  Neck: Neck supple. No tracheal deviation present. No thyromegaly present.  No carotid bruit  Cardiovascular: Normal rate, regular rhythm and normal heart sounds.   2/6 sys murmur heard.  No edema. Pulmonary/Chest: Effort normal and breath sounds normal. No respiratory distress. She has no wheezes. She has no rales.  Breast: deferred   Abdominal: Soft. She exhibits no distension. There is no tenderness.  Lymphadenopathy: She has no cervical adenopathy.  Skin: Skin is warm and dry. She is not diaphoretic.  Psychiatric: She has a normal mood and affect. Her behavior is normal.     Lab Results  Component Value Date   WBC 9.3 06/16/2023   HGB 12.3 06/16/2023   HCT 36.6 06/16/2023   PLT 317.0 06/16/2023   GLUCOSE 82 06/16/2023   CHOL 244 (H) 09/23/2023   TRIG 289 (H) 09/23/2023   HDL 42 09/23/2023   LDLDIRECT 156.0 06/16/2023   LDLCALC 149 (H) 09/23/2023   ALT 15 06/16/2023   AST 20 06/16/2023   NA 138 06/16/2023   K 4.2 06/16/2023   CL 105 06/16/2023   CREATININE 1.14 06/16/2023   BUN 23 06/16/2023   CO2 22 06/16/2023   TSH 2.27 06/16/2023   HGBA1C 5.6 06/16/2023         Assessment & Plan:   Physical exam: Screening blood work  ordered Exercise  none Weight  obese - would benefit from weight exercise Substance abuse  none   Reviewed recommended immunizations.   Health Maintenance  Topic Date Due   Colonoscopy  03/23/2024   COVID-19 Vaccine (3 - 2025-26 season) 06/13/2024   Medicare Annual Wellness (AWV)  10/15/2024   DEXA SCAN  01/12/2026   DTaP/Tdap/Td (3 - Td or Tdap) 02/10/2034   Pneumococcal Vaccine: 50+ Years  Completed   Influenza Vaccine  Completed   Hepatitis C Screening  Completed    Zoster Vaccines- Shingrix  Completed   Meningococcal B Vaccine  Aged Out   Mammogram  Discontinued          See Problem List for Assessment and Plan of chronic medical problems.

## 2024-08-01 ENCOUNTER — Ambulatory Visit: Admitting: Internal Medicine

## 2024-08-01 ENCOUNTER — Encounter: Payer: Self-pay | Admitting: Internal Medicine

## 2024-08-01 VITALS — BP 128/84 | HR 58 | Temp 97.8°F | Ht 61.0 in | Wt 176.0 lb

## 2024-08-01 DIAGNOSIS — I2510 Atherosclerotic heart disease of native coronary artery without angina pectoris: Secondary | ICD-10-CM | POA: Diagnosis not present

## 2024-08-01 DIAGNOSIS — E559 Vitamin D deficiency, unspecified: Secondary | ICD-10-CM | POA: Diagnosis not present

## 2024-08-01 DIAGNOSIS — N1831 Chronic kidney disease, stage 3a: Secondary | ICD-10-CM | POA: Diagnosis not present

## 2024-08-01 DIAGNOSIS — Z1211 Encounter for screening for malignant neoplasm of colon: Secondary | ICD-10-CM

## 2024-08-01 DIAGNOSIS — R7303 Prediabetes: Secondary | ICD-10-CM

## 2024-08-01 DIAGNOSIS — Z0000 Encounter for general adult medical examination without abnormal findings: Secondary | ICD-10-CM | POA: Diagnosis not present

## 2024-08-01 DIAGNOSIS — E66811 Obesity, class 1: Secondary | ICD-10-CM

## 2024-08-01 DIAGNOSIS — F419 Anxiety disorder, unspecified: Secondary | ICD-10-CM

## 2024-08-01 DIAGNOSIS — E7800 Pure hypercholesterolemia, unspecified: Secondary | ICD-10-CM

## 2024-08-01 DIAGNOSIS — Z6833 Body mass index (BMI) 33.0-33.9, adult: Secondary | ICD-10-CM

## 2024-08-01 DIAGNOSIS — F3289 Other specified depressive episodes: Secondary | ICD-10-CM

## 2024-08-01 DIAGNOSIS — L299 Pruritus, unspecified: Secondary | ICD-10-CM

## 2024-08-01 DIAGNOSIS — M85851 Other specified disorders of bone density and structure, right thigh: Secondary | ICD-10-CM

## 2024-08-01 DIAGNOSIS — Z636 Dependent relative needing care at home: Secondary | ICD-10-CM | POA: Insufficient documentation

## 2024-08-01 LAB — CBC WITH DIFFERENTIAL/PLATELET
Basophils Absolute: 0.1 K/uL (ref 0.0–0.1)
Basophils Relative: 0.7 % (ref 0.0–3.0)
Eosinophils Absolute: 0.2 K/uL (ref 0.0–0.7)
Eosinophils Relative: 2.2 % (ref 0.0–5.0)
HCT: 35.5 % — ABNORMAL LOW (ref 36.0–46.0)
Hemoglobin: 12.1 g/dL (ref 12.0–15.0)
Lymphocytes Relative: 37.2 % (ref 12.0–46.0)
Lymphs Abs: 3.2 K/uL (ref 0.7–4.0)
MCHC: 34.1 g/dL (ref 30.0–36.0)
MCV: 86.7 fl (ref 78.0–100.0)
Monocytes Absolute: 0.8 K/uL (ref 0.1–1.0)
Monocytes Relative: 9.2 % (ref 3.0–12.0)
Neutro Abs: 4.4 K/uL (ref 1.4–7.7)
Neutrophils Relative %: 50.7 % (ref 43.0–77.0)
Platelets: 289 K/uL (ref 150.0–400.0)
RBC: 4.09 Mil/uL (ref 3.87–5.11)
RDW: 13.4 % (ref 11.5–15.5)
WBC: 8.7 K/uL (ref 4.0–10.5)

## 2024-08-01 LAB — COMPREHENSIVE METABOLIC PANEL WITH GFR
ALT: 16 U/L (ref 0–35)
AST: 19 U/L (ref 0–37)
Albumin: 4.8 g/dL (ref 3.5–5.2)
Alkaline Phosphatase: 60 U/L (ref 39–117)
BUN: 25 mg/dL — ABNORMAL HIGH (ref 6–23)
CO2: 23 meq/L (ref 19–32)
Calcium: 9.7 mg/dL (ref 8.4–10.5)
Chloride: 104 meq/L (ref 96–112)
Creatinine, Ser: 0.97 mg/dL (ref 0.40–1.20)
GFR: 55.63 mL/min — ABNORMAL LOW (ref >60.00)
Glucose, Bld: 72 mg/dL (ref 70–99)
Potassium: 4.5 meq/L (ref 3.5–5.1)
Sodium: 138 meq/L (ref 135–145)
Total Bilirubin: 0.4 mg/dL (ref 0.2–1.2)
Total Protein: 7.6 g/dL (ref 6.0–8.3)

## 2024-08-01 LAB — LIPID PANEL
Cholesterol: 241 mg/dL — ABNORMAL HIGH (ref 0–200)
HDL: 40.6 mg/dL (ref >39.00)
NonHDL: 200.52
Total CHOL/HDL Ratio: 6
Triglycerides: 411 mg/dL — ABNORMAL HIGH (ref 0.0–149.0)
VLDL: 82.2 mg/dL — ABNORMAL HIGH (ref 0.0–40.0)

## 2024-08-01 LAB — LDL CHOLESTEROL, DIRECT: Direct LDL: 142 mg/dL

## 2024-08-01 LAB — HEMOGLOBIN A1C: Hgb A1c MFr Bld: 5.7 % (ref 4.6–6.5)

## 2024-08-01 LAB — VITAMIN D 25 HYDROXY (VIT D DEFICIENCY, FRACTURES): VITD: 36 ng/mL (ref 30.00–100.00)

## 2024-08-01 NOTE — Assessment & Plan Note (Signed)
 Chronic Regular exercise and healthy diet encouraged Continue Zetia  10 mg daily, vascepa  2 g bid

## 2024-08-01 NOTE — Assessment & Plan Note (Signed)
 Chronic dexa up-to-date Continue calcium  and vitamin d  daily Encouraged regular exercise

## 2024-08-01 NOTE — Assessment & Plan Note (Signed)
 Chronic Following with dermatology and has seen allergy Thought to be dry skin She does use lotion daily-twice daily Taking Zyrtec  at night and Benadryl in the morning Not sleeping well and tired Advised to take Benadryl at night and switch to Claritin or Allegra during the day Will follow-up with dermatology

## 2024-08-01 NOTE — Assessment & Plan Note (Signed)
 Chronic Mild Check cmp, cbc Following with cardiology On Zetia 10 mg daily, vascepa 2gm bid,  not taking Praluent 150 mg Q 14 days Continue aspirin 81 mg daily

## 2024-08-01 NOTE — Assessment & Plan Note (Signed)
 Chronic BMI 33.25 kg/m Encouraged weight loss Advised regular exercise, healthy diet with decreased portions

## 2024-08-01 NOTE — Assessment & Plan Note (Signed)
Chronic Mild, stable CMP, CBC Advised increase water intake Avoid NSAIDs Low-sodium diet

## 2024-08-01 NOTE — Assessment & Plan Note (Signed)
 Chronic Husband has Parkinson's and memory issues She worries a lot-extra stress related to being his caregiver

## 2024-08-01 NOTE — Assessment & Plan Note (Signed)
 Chronic Somewhat controlled She feels the medication is working well, but she worries about her husband and the problems he has Does not feel like she needs to change medication Continue citalopram  20 mg daily

## 2024-08-01 NOTE — Assessment & Plan Note (Signed)
 Chronic Check a1c Low sugar / carb diet Stressed regular exercise

## 2024-08-01 NOTE — Assessment & Plan Note (Signed)
 Chronic Taking vitamin D daily Check vitamin D level

## 2024-08-01 NOTE — Assessment & Plan Note (Addendum)
 Chronic Ideally controlled-she admits she still worries a lot and is anxious She feels the medication is working well it is just the circumstances Improving sleep will likely help Stressed regular exercise Continue citalopram  20 mg daily

## 2024-08-04 ENCOUNTER — Ambulatory Visit: Payer: Self-pay | Admitting: Internal Medicine

## 2024-08-08 ENCOUNTER — Telehealth: Payer: Self-pay | Admitting: Neurology

## 2024-08-08 NOTE — Telephone Encounter (Signed)
 Pt is returning call. Please call. Thanks

## 2024-08-17 ENCOUNTER — Other Ambulatory Visit: Payer: Self-pay | Admitting: Internal Medicine

## 2024-08-29 DIAGNOSIS — H578A3 Foreign body sensation, bilateral eyes: Secondary | ICD-10-CM | POA: Diagnosis not present

## 2024-08-29 DIAGNOSIS — H04123 Dry eye syndrome of bilateral lacrimal glands: Secondary | ICD-10-CM | POA: Diagnosis not present

## 2024-08-29 DIAGNOSIS — H1045 Other chronic allergic conjunctivitis: Secondary | ICD-10-CM | POA: Diagnosis not present

## 2024-08-29 DIAGNOSIS — H25813 Combined forms of age-related cataract, bilateral: Secondary | ICD-10-CM | POA: Diagnosis not present

## 2024-11-04 ENCOUNTER — Encounter: Payer: Self-pay | Admitting: Gastroenterology

## 2024-12-20 ENCOUNTER — Ambulatory Visit

## 2025-01-30 ENCOUNTER — Ambulatory Visit: Admitting: Internal Medicine
